# Patient Record
Sex: Female | Born: 1950 | Race: White | Hispanic: No | Marital: Married | Smoking: Never smoker
Health system: Southern US, Community
[De-identification: ages and names within clinical notes are randomized; demographics above are authoritative.]

## PROBLEM LIST (undated history)

## (undated) DIAGNOSIS — C50919 Malignant neoplasm of unspecified site of unspecified female breast: Secondary | ICD-10-CM

## (undated) DIAGNOSIS — H409 Unspecified glaucoma: Secondary | ICD-10-CM

## (undated) DIAGNOSIS — C801 Malignant (primary) neoplasm, unspecified: Secondary | ICD-10-CM

## (undated) DIAGNOSIS — M1990 Unspecified osteoarthritis, unspecified site: Secondary | ICD-10-CM

## (undated) DIAGNOSIS — T753XXA Motion sickness, initial encounter: Secondary | ICD-10-CM

## (undated) DIAGNOSIS — M8580 Other specified disorders of bone density and structure, unspecified site: Secondary | ICD-10-CM

## (undated) DIAGNOSIS — I2699 Other pulmonary embolism without acute cor pulmonale: Secondary | ICD-10-CM

## (undated) HISTORY — DX: Unspecified glaucoma: H40.9

## (undated) HISTORY — DX: Malignant (primary) neoplasm, unspecified: C80.1

## (undated) HISTORY — PX: OTHER SURGICAL HISTORY: SHX169

## (undated) HISTORY — DX: Malignant neoplasm of unspecified site of unspecified female breast: C50.919

## (undated) HISTORY — PX: EYE SURGERY: SHX253

## (undated) HISTORY — DX: Other pulmonary embolism without acute cor pulmonale: I26.99

## (undated) HISTORY — PX: TONSILLECTOMY: SUR1361

---

## 1982-12-26 HISTORY — PX: CERVICAL CONE BIOPSY: SUR198

## 2004-12-30 ENCOUNTER — Ambulatory Visit: Payer: Self-pay | Admitting: Internal Medicine

## 2005-12-26 HISTORY — PX: LEG SURGERY: SHX1003

## 2005-12-26 HISTORY — PX: HIP PINNING: SHX1757

## 2006-01-31 ENCOUNTER — Ambulatory Visit: Payer: Self-pay | Admitting: Internal Medicine

## 2006-08-23 ENCOUNTER — Inpatient Hospital Stay: Payer: Self-pay | Admitting: Unknown Physician Specialty

## 2006-08-23 ENCOUNTER — Other Ambulatory Visit: Payer: Self-pay

## 2006-08-24 ENCOUNTER — Other Ambulatory Visit: Payer: Self-pay

## 2006-09-09 ENCOUNTER — Other Ambulatory Visit: Payer: Self-pay

## 2006-09-09 ENCOUNTER — Inpatient Hospital Stay: Payer: Self-pay | Admitting: Internal Medicine

## 2006-09-18 ENCOUNTER — Ambulatory Visit: Payer: Self-pay | Admitting: Internal Medicine

## 2006-09-25 ENCOUNTER — Ambulatory Visit: Payer: Self-pay | Admitting: Internal Medicine

## 2006-09-27 ENCOUNTER — Ambulatory Visit: Payer: Self-pay | Admitting: Internal Medicine

## 2006-10-04 ENCOUNTER — Ambulatory Visit: Payer: Self-pay | Admitting: Internal Medicine

## 2006-10-11 ENCOUNTER — Ambulatory Visit: Payer: Self-pay | Admitting: Internal Medicine

## 2007-02-08 ENCOUNTER — Ambulatory Visit: Payer: Self-pay | Admitting: Internal Medicine

## 2007-02-14 ENCOUNTER — Ambulatory Visit: Payer: Self-pay | Admitting: Internal Medicine

## 2007-04-24 ENCOUNTER — Ambulatory Visit: Payer: Self-pay | Admitting: Internal Medicine

## 2009-07-30 ENCOUNTER — Ambulatory Visit: Payer: Self-pay | Admitting: Internal Medicine

## 2011-05-19 ENCOUNTER — Ambulatory Visit: Payer: Self-pay | Admitting: Internal Medicine

## 2014-04-18 ENCOUNTER — Ambulatory Visit: Payer: Self-pay | Admitting: Internal Medicine

## 2015-12-02 DIAGNOSIS — Z86711 Personal history of pulmonary embolism: Secondary | ICD-10-CM | POA: Insufficient documentation

## 2015-12-04 ENCOUNTER — Other Ambulatory Visit: Payer: Self-pay | Admitting: Internal Medicine

## 2015-12-09 ENCOUNTER — Other Ambulatory Visit: Payer: Self-pay | Admitting: Internal Medicine

## 2015-12-09 DIAGNOSIS — Z1231 Encounter for screening mammogram for malignant neoplasm of breast: Secondary | ICD-10-CM

## 2015-12-11 ENCOUNTER — Ambulatory Visit
Admission: RE | Admit: 2015-12-11 | Discharge: 2015-12-11 | Disposition: A | Discharge status: Home / Self Care | Payer: Medicare PPO | Source: Ambulatory Visit | Attending: Internal Medicine | Admitting: Internal Medicine

## 2015-12-11 ENCOUNTER — Other Ambulatory Visit: Payer: Self-pay | Admitting: Internal Medicine

## 2015-12-11 DIAGNOSIS — Z1231 Encounter for screening mammogram for malignant neoplasm of breast: Secondary | ICD-10-CM

## 2016-06-02 ENCOUNTER — Emergency Department
Admission: EM | Admit: 2016-06-02 | Discharge: 2016-06-02 | Disposition: A | Discharge status: Home / Self Care | Payer: Medicare Other | Attending: Emergency Medicine | Admitting: Emergency Medicine

## 2016-06-02 ENCOUNTER — Emergency Department: Payer: Medicare Other

## 2016-06-02 ENCOUNTER — Encounter: Payer: Self-pay | Admitting: Emergency Medicine

## 2016-06-02 DIAGNOSIS — Y9389 Activity, other specified: Secondary | ICD-10-CM | POA: Insufficient documentation

## 2016-06-02 DIAGNOSIS — S52122A Displaced fracture of head of left radius, initial encounter for closed fracture: Secondary | ICD-10-CM | POA: Insufficient documentation

## 2016-06-02 DIAGNOSIS — Y999 Unspecified external cause status: Secondary | ICD-10-CM | POA: Insufficient documentation

## 2016-06-02 DIAGNOSIS — S0990XA Unspecified injury of head, initial encounter: Secondary | ICD-10-CM | POA: Diagnosis present

## 2016-06-02 DIAGNOSIS — Y929 Unspecified place or not applicable: Secondary | ICD-10-CM | POA: Insufficient documentation

## 2016-06-02 DIAGNOSIS — S0181XA Laceration without foreign body of other part of head, initial encounter: Secondary | ICD-10-CM | POA: Insufficient documentation

## 2016-06-02 DIAGNOSIS — W19XXXA Unspecified fall, initial encounter: Secondary | ICD-10-CM

## 2016-06-02 DIAGNOSIS — W108XXA Fall (on) (from) other stairs and steps, initial encounter: Secondary | ICD-10-CM | POA: Diagnosis not present

## 2016-06-02 MED ORDER — CEPHALEXIN 500 MG PO CAPS: 500.0000 mg | ORAL_CAPSULE | Freq: Three times a day (TID) | ORAL | Status: DC

## 2016-06-02 MED ORDER — HYDROCODONE-ACETAMINOPHEN 5-325 MG PO TABS: 1.0000 | ORAL_TABLET | Freq: Four times a day (QID) | ORAL | Status: DC | PRN

## 2016-06-02 MED ORDER — OXYCODONE-ACETAMINOPHEN 5-325 MG PO TABS
1.0000 | ORAL_TABLET | Freq: Once | ORAL | Status: AC
  Administered 2016-06-02: 1 via ORAL
  Filled 2016-06-02: qty 1

## 2016-06-02 MED ORDER — LIDOCAINE-EPINEPHRINE (PF) 1 %-1:200000 IJ SOLN
10.0000 mL | Freq: Once | INTRAMUSCULAR | Status: DC
  Filled 2016-06-02: qty 30

## 2016-06-02 NOTE — Discharge Instructions (Signed)
Concussion, Adult A concussion is a brain injury. It is caused by:  A hit to the head.  A quick and sudden movement (jolt) of the head or neck. A concussion is usually not life threatening. Even so, it can cause serious problems. If you had a concussion before, you may have concussion-like problems after a hit to your head. HOME CARE General Instructions  Follow your doctor's directions carefully.  Take medicines only as told by your doctor.  Only take medicines your doctor says are safe.  Do not drink alcohol until your doctor says it is okay. Alcohol and some drugs can slow down healing. They can also put you at risk for further injury.  If you are having trouble remembering things, write them down.  Try to do one thing at a time if you get distracted easily. For example, do not watch TV while making dinner.  Talk to your family members or close friends when making important decisions.  Follow up with your doctor as told.  Watch your symptoms. Tell others to do the same. Serious problems can sometimes happen after a concussion. Older adults are more likely to have these problems.  Tell your teachers, school nurse, school counselor, coach, Product/process development scientist, or work Freight forwarder about your concussion. Tell them about what you can or cannot do. They should watch to see if:  It gets even harder for you to pay attention or concentrate.  It gets even harder for you to remember things or learn new things.  You need more time than normal to finish things.  You become annoyed (irritable) more than before.  You are not able to deal with stress as well.  You have more problems than before.  Rest. Make sure you:  Get plenty of sleep at night.  Go to sleep early.  Go to bed at the same time every day. Try to wake up at the same time.  Rest during the day.  Take naps when you feel tired.  Limit activities where you have to think a lot or concentrate. These include:  Doing  homework.  Doing work related to a job.  Watching TV.  Using the computer. Returning To Your Regular Activities Return to your normal activities slowly, not all at once. You must give your body and brain enough time to heal.   Do not play sports or do other athletic activities until your doctor says it is okay.  Ask your doctor when you can drive, ride a bicycle, or work other vehicles or machines. Never do these things if you feel dizzy.  Ask your doctor about when you can return to work or school. Preventing Another Concussion It is very important to avoid another brain injury, especially before you have healed. In rare cases, another injury can lead to permanent brain damage, brain swelling, or death. The risk of this is greatest during the first 7-10 days after your injury. Avoid injuries by:   Wearing a seat belt when riding in a car.  Not drinking too much alcohol.  Avoiding activities that could lead to a second concussion (such as contact sports).  Wearing a helmet when doing activities like:  Biking.  Skiing.  Skateboarding.  Skating.  Making your home safer by:  Removing things from the floor or stairways that could make you trip.  Using grab bars in bathrooms and handrails by stairs.  Placing non-slip mats on floors and in bathtubs.  Improve lighting in dark areas. GET HELP IF:  It gets even harder for you to pay attention or concentrate.  It gets even harder for you to remember things or learn new things.  You need more time than normal to finish things.  You become annoyed (irritable) more than before.  You are not able to deal with stress as well.  You have more problems than before.  You have problems keeping your balance.  You are not able to react quickly when you should. Get help if you have any of these problems for more than 2 weeks:   Lasting (chronic) headaches.  Dizziness or trouble balancing.  Feeling sick to your stomach  (nausea).  Seeing (vision) problems.  Being affected by noises or light more than normal.  Feeling sad, low, down in the dumps, blue, gloomy, or empty (depressed).  Mood changes (mood swings).  Feeling of fear or nervousness about what may happen (anxiety).  Feeling annoyed.  Memory problems.  Problems concentrating or paying attention.  Sleep problems.  Feeling tired all the time. GET HELP RIGHT AWAY IF:   You have bad headaches or your headaches get worse.  You have weakness (even if it is in one hand, leg, or part of the face).  You have loss of feeling (numbness).  You feel off balance.  You keep throwing up (vomiting).  You feel tired.  One black center of your eye (pupil) is larger than the other.  You twitch or shake violently (convulse).  Your speech is not clear (slurred).  You are more confused, easily angered (agitated), or annoyed than before.  You have more trouble resting than before.  You are unable to recognize people or places.  You have neck pain.  It is difficult to wake you up.  You have unusual behavior changes.  You pass out (lose consciousness). MAKE SURE YOU:   Understand these instructions.  Will watch your condition.  Will get help right away if you are not doing well or get worse.   This information is not intended to replace advice given to you by your health care provider. Make sure you discuss any questions you have with your health care provider.   Document Released: 11/30/2009 Document Revised: 01/02/2015 Document Reviewed: 07/04/2013 Elsevier Interactive Patient Education 2016 [place] or Splint Care Casts and splints support injured limbs and keep bones from moving while they heal.  HOME CARE  Keep the cast or splint uncovered during the drying period.  A plaster cast can take 24 to 48 hours to dry.  A fiberglass cast will dry in less than 1 hour.  Do not rest the cast on anything harder than a  pillow for 24 hours.  Do not put weight on your injured limb. Do not put pressure on the cast. Wait for your doctor's approval.  Keep the cast or splint dry.  Cover the cast or splint with a plastic bag during baths or wet weather.  If you have a cast over your chest and belly (trunk), take sponge baths until the cast is taken off.  If your cast gets wet, dry it with a towel or blow dryer. Use the cool setting on the blow dryer.  Keep your cast or splint clean. Wash a dirty cast with a damp cloth.  Do not put any objects under your cast or splint.  Do not scratch the skin under the cast with an object. If itching is a problem, use a blow dryer on a cool setting over the itchy  area.  Do not trim or cut your cast.  Do not take out the padding from inside your cast.  Exercise your joints near the cast as told by your doctor.  Raise (elevate) your injured limb on 1 or 2 pillows for the first 1 to 3 days. GET HELP IF:  Your cast or splint cracks.  Your cast or splint is too tight or too loose.  You itch badly under the cast.  Your cast gets wet or has a soft spot.  You have a bad smell coming from the cast.  You get an object stuck under the cast.  Your skin around the cast becomes red or sore.  You have new or more pain after the cast is put on. GET HELP RIGHT AWAY IF:  You have fluid leaking through the cast.  You cannot move your fingers or toes.  Your fingers or toes turn blue or white or are cool, painful, or puffy (swollen).  You have tingling or lose feeling (numbness) around the injured area.  You have bad pain or pressure under the cast.  You have trouble breathing or have shortness of breath.  You have chest pain.   This information is not intended to replace advice given to you by your health care provider. Make sure you discuss any questions you have with your health care provider.   Document Released: 04/13/2011 Document Revised: 08/14/2013 Document  Reviewed: 06/20/2013 Elsevier Interactive Patient Education 2016 [employer]  Cryotherapy Cryotherapy is when you put ice on your injury. Ice helps lessen pain and puffiness (swelling) after an injury. Ice works the best when you start using it in the first 24 to 48 hours after an injury. HOME CARE  Put a dry or damp towel between the ice pack and your skin.  You may press gently on the ice pack.  Leave the ice on for no more than 10 to 20 minutes at a time.  Check your skin after 5 minutes to make sure your skin is okay.  Rest at least 20 minutes between ice pack uses.  Stop using ice when your skin loses feeling (numbness).  Do not use ice on someone who cannot tell you when it hurts. This includes small children and people with memory problems (dementia). GET HELP RIGHT AWAY IF:  You have white spots on your skin.  Your skin turns blue or pale.  Your skin feels waxy or hard.  Your puffiness gets worse. MAKE SURE YOU:   Understand these instructions.  Will watch your condition.  Will get help right away if you are not doing well or get worse.   This information is not intended to replace advice given to you by your health care provider. Make sure you discuss any questions you have with your health care provider.   Document Released: 05/30/2008 Document Revised: 03/05/2012 Document Reviewed: 08/04/2011 Elsevier Interactive Patient Education 2016 [employer]  Facial Laceration A facial laceration is a cut on the face. These injuries can be painful and cause bleeding. Some cuts may need to be closed with stitches (sutures), skin adhesive strips, or wound glue. Cuts usually heal quickly but can leave a scar. It can take 1-2 years for the scar to go away completely. HOME CARE   Only take medicines as told by your doctor.  Follow your doctor's instructions for wound care. For Stitches:  Keep the cut clean and dry.  If you have a bandage (dressing), change it at  least once  a day. Change the bandage if it gets wet or dirty, or as told by your doctor.  Wash the cut with soap and water 2 times a day. Rinse the cut with water. Pat it dry with a clean towel.  Put a thin layer of medicated cream on the cut as told by your doctor.  You may shower after the first 24 hours. Do not soak the cut in water until the stitches are removed.  Have your stitches removed as told by your doctor.  Do not wear any makeup until a few days after your stitches are removed. For Skin Adhesive Strips:  Keep the cut clean and dry.  Do not get the strips wet. You may take a bath, but be careful to keep the cut dry.  If the cut gets wet, pat it dry with a clean towel.  The strips will fall off on their own. Do not remove the strips that are still stuck to the cut. For Wound Glue:  You may shower or take baths. Do not soak or scrub the cut. Do not swim. Avoid heavy sweating until the glue falls off on its own. After a shower or bath, pat the cut dry with a clean towel.  Do not put medicine or makeup on your cut until the glue falls off.  If you have a bandage, do not put tape over the glue.  Avoid lots of sunlight or tanning lamps until the glue falls off.  The glue will fall off on its own in 5-10 days. Do not pick at the glue. After Healing:  Put sunscreen on the cut for the first year to reduce your scar. GET HELP IF:  You have a fever. GET HELP RIGHT AWAY IF:   Your cut area gets red, painful, or puffy (swollen).  You see a yellowish-white fluid (pus) coming from the cut.   This information is not intended to replace advice given to you by your health care provider. Make sure you discuss any questions you have with your health care provider.   Document Released: 05/30/2008 Document Revised: 01/02/2015 Document Reviewed: 07/25/2013 Elsevier Interactive Patient Education 2016 [place] Injury, Adult You have a head injury. Headaches and  throwing up (vomiting) are common after a head injury. It should be easy to wake up from sleeping. Sometimes you must stay in the hospital. Most problems happen within the first 24 hours. Side effects may occur up to 7-10 days after the injury.  WHAT ARE THE TYPES OF HEAD INJURIES? Head injuries can be as minor as a bump. Some head injuries can be more severe. More severe head injuries include:  A jarring injury to the brain (concussion).  A bruise of the brain (contusion). This mean there is bleeding in the brain that can cause swelling.  A cracked skull (skull fracture).  Bleeding in the brain that collects, clots, and forms a bump (hematoma). WHEN SHOULD I GET HELP RIGHT AWAY?   You are confused or sleepy.  You cannot be woken up.  You feel sick to your stomach (nauseous) or keep throwing up (vomiting).  Your dizziness or unsteadiness is getting worse.  You have very bad, lasting headaches that are not helped by medicine. Take medicines only as told by your doctor.  You cannot use your arms or legs like normal.  You cannot walk.  You notice changes in the black spots in the center of the colored part of your eye (pupil).  You have  clear or bloody fluid coming from your nose or ears.  You have trouble seeing. During the next 24 hours after the injury, you must stay with someone who can watch you. This person should get help right away (call 911 in the [place] if you start to shake and are not able to control it (have seizures), you pass out, or you are unable to wake up. HOW CAN I PREVENT A HEAD INJURY IN THE FUTURE?  Wear seat belts.  Wear a helmet while bike riding and playing sports like football.  Stay away from dangerous activities around the house. WHEN CAN I RETURN TO NORMAL ACTIVITIES AND ATHLETICS? See your doctor before doing these activities. You should not do normal activities or play contact sports until 1 week after the following symptoms have  stopped:  Headache that does not go away.  Dizziness.  Poor attention.  Confusion.  Memory problems.  Sickness to your stomach or throwing up.  Tiredness.  Fussiness.  Bothered by bright lights or loud noises.  Anxiousness or depression.  Restless sleep. MAKE SURE YOU:   Understand these instructions.  Will watch your condition.  Will get help right away if you are not doing well or get worse.   This information is not intended to replace advice given to you by your health care provider. Make sure you discuss any questions you have with your health care provider.   Document Released: 11/24/2008 Document Revised: 01/02/2015 Document Reviewed: 08/19/2013 Elsevier Interactive Patient Education 2016 [employer] Inc[employer]    Radial Fracture A radial fracture is a break in the radius bone, which is the long bone of the forearm that is on the same side as your thumb. Your forearm is the part of your arm that is between your elbow and your wrist. It is made up of two bones: the radius and the ulna. Most radial fractures occur near the wrist (distal radialfracture) or near the elbow (radial head fracture). A distal radial fracture is the most common type of broken arm. This fracture usually occurs about an inch above the wrist. Fractures of the middle part of the bone are less common. CAUSES  Falling with your arm outstretched is the most common cause of a radial fracture. Other causes include:  Car accidents.  Bike accidents.  A direct blow to the middle part of the radius. RISK FACTORS  You may be at greater risk for a distal radial fracture if you are 44 years of age or older.  You may be at greater risk for a radial head fracture if you are:  Female.  60-35 years old.  You may be at a greater risk for all types of radial fractures if you have a condition that causes your bones to be weak or thin (osteoporosis). SIGNS AND SYMPTOMS A radial fracture causes pain  immediately after the injury. Other signs and symptoms include:  An abnormal bend or bump in your arm (deformity).  Swelling.  Bruising.  Numbness or tingling.  Tenderness.  Limited movement. DIAGNOSIS  Your health care provider may diagnose a radial fracture based on:  Your symptoms.  Your medical history, including any recent injury.  A physical exam. Your health care provider will look for any deformity and feel for tenderness over the break. Your health care provider will also check whether the bone is out of place.  An X-ray exam to confirm the diagnosis and learn more about the type of fracture. TREATMENT The goals of treatment are to  get the bone in proper position for healing and to keep it from moving so it will heal over time. Your treatment will depend on many factors, especially the type of fracture that you have.  If the fractured bone:  Is in the correct position (nondisplaced), you may only need to wear a cast or a splint.  Has a slightly displaced fracture, you may need to have the bones moved back into place manually (closed reduction) before the splint or cast is put on.  You may have a temporary splint before you have a plaster cast. The splint allows room for some swelling. After a few days, a cast can replace the splint.  You may have to wear the cast for about 6 weeks or as directed by your health care provider.  The cast may be changed after about 3 weeks or as directed by your health care provider.  After your cast is taken off, you may need physical therapy to regain full movement in your wrist or elbow.  You may need emergency surgery if you have:  A fractured bone that is out of position (displaced).  A fracture with multiple fragments (comminuted fracture).  A fracture that breaks the skin (open fracture). This type of fracture may require surgical wires, plates, or screws to hold the bone in place.  You may have X-rays every couple of weeks  to check on your healing. HOME CARE INSTRUCTIONS  Keep the injured arm above the level of your heart while you are sitting or lying down. This helps to reduce swelling and pain.  Apply ice to the injured area:  Put ice in a plastic bag.  Place a towel between your skin and the bag.  Leave the ice on for 20 minutes, 2-3 times per day.  Move your fingers often to avoid stiffness and to minimize swelling.  If you have a plaster or fiberglass cast:  Do not try to scratch the skin under the cast using sharp or pointed objects.  Check the skin around the cast every day. You may put lotion on any red or sore areas.  Keep your cast dry and clean.  If you have a plaster splint:  Wear the splint as directed.  Loosen the elastic around the splint if your fingers become numb and tingle, or if they turn cold and blue.  Do not put pressure on any part of your cast until it is fully hardened. Rest your cast only on a pillow for the first 24 hours.  Protect your cast or splint while bathing or showering, as directed by your health care provider. Do not put your cast or splint into water.  Take medicines only as directed by your health care provider.  Return to activities, such as sports, as directed by your health care provider. Ask your health care provider what activities are safe for you.  Keep all follow-up visits as directed by your health care provider. This is important. SEEK MEDICAL CARE IF:  Your pain medicine is not helping.  Your cast gets damaged or it breaks.  Your cast becomes loose.  Your cast gets wet.  You have more severe pain or swelling than you did before the cast.  You have severe pain when stretching your fingers.  You continue to have pain or stiffness in your elbow or your wrist after your cast is taken off. SEEK IMMEDIATE MEDICAL CARE IF:  You cannot move your fingers.  You lose feeling in your fingers or  your hand.  Your hand or your fingers turn  cold and pale or blue.  You notice a bad smell coming from your cast.  You have drainage from underneath your cast.  You have new stains from blood or drainage seeping through your cast.   This information is not intended to replace advice given to you by your health care provider. Make sure you discuss any questions you have with your health care provider.   Document Released: 05/25/2006 Document Revised: 01/02/2015 Document Reviewed: 06/06/2014 Elsevier Interactive Patient Education 2016 [place] Sutures are stitches that can be used to close wounds. Taking care of your wound properly can help to prevent pain and infection. It can also help your wound to heal more quickly. HOW TO CARE FOR YOUR SUTURED WOUND Wound Care  Keep the wound clean and dry.  If you were given a bandage (dressing), you should change it at least once per day or as directed by your health care provider. You should also change it if it becomes wet or dirty.  Keep the wound completely dry for the first 24 hours or as directed by your health care provider. After that time, you may shower or bathe. However, make sure that the wound is not soaked in water until the sutures have been removed.  Clean the wound one time each day or as directed by your health care provider.  Wash the wound with soap and water.  Rinse the wound with water to remove all soap.  Pat the wound dry with a clean towel. Do not rub the wound.  Aftercleaning the wound, apply a thin layer of antibioticointment as directed by your health care provider. This will help to prevent infection and keep the dressing from sticking to the wound.  Have the sutures removed as directed by your health care provider. General Instructions  Take or apply medicines only as directed by your health care provider.  To help prevent scarring, make sure to cover your wound with sunscreen whenever you are outside after the sutures are  removed and the wound is healed. Make sure to wear a sunscreen of at least 30 SPF.  If you were prescribed an antibiotic medicine or ointment, finish all of it even if you start to feel better.  Do not scratch or pick at the wound.  Keep all follow-up visits as directed by your health care provider. This is important.  Check your wound every day for signs of infection. Watch for:   Redness, swelling, or pain.  Fluid, blood, or pus.  Raise (elevate) the injured area above the level of your heart while you are sitting or lying down, if possible.  Avoid stretching your wound.  Drink enough fluids to keep your urine clear or pale yellow. SEEK MEDICAL CARE IF:  You received a tetanus shot and you have swelling, severe pain, redness, or bleeding at the injection site.  You have a fever.  A wound that was closed breaks open.  You notice a bad smell coming from the wound.  You notice something coming out of the wound, such as wood or glass.  Your pain is not controlled with medicine.  You have increased redness, swelling, or pain at the site of your wound.  You have fluid, blood, or pus coming from your wound.  You notice a change in the color of your skin near your wound.  You need to change the dressing frequently due to fluid, blood, or  pus draining from the wound.  You develop a new rash.  You develop numbness around the wound. SEEK IMMEDIATE MEDICAL CARE IF:  You develop severe swelling around the injury site.  Your pain suddenly increases and is severe.  You develop painful lumps near the wound or on skin that is anywhere on your body.  You have a red streak going away from your wound.  The wound is on your hand or foot and you cannot properly move a finger or toe.  The wound is on your hand or foot and you notice that your fingers or toes look pale or bluish.   This information is not intended to replace advice given to you by your health care provider. Make  sure you discuss any questions you have with your health care provider.   Document Released: 01/19/2005 Document Revised: 04/28/2015 Document Reviewed: 07/24/2013 Elsevier Interactive Patient Education [employer].

## 2016-06-02 NOTE — ED Notes (Addendum)
Pt reports tripping walking into garage and hitting head on concrete floor, denies LOC. Pt reports taking aspirin daily. Pt with laceration to left side of forehead and reports pain to left lower arm.

## 2016-06-02 NOTE — ED Notes (Signed)
Patient presents to the ED after falling and hitting her head.  Patient states she was going down her garage steps barefoot and tripped.  Patient denies feeling dizzy.  Denies losing consciousness.  Patient is alert and oriented x 3.  Patient is in no obvious distress at this time.

## 2016-06-02 NOTE — ED Provider Notes (Signed)
[facility] Emergency Department Provider Note  ____________________________________________  Time seen: Approximately 7:39 PM  I have reviewed the triage vital signs and the nursing notes.   HISTORY  Chief Complaint Head Injury    HPI Stacy Warren is a 65 y.o. female, NAD, presents to the emergency department after a fall down 2 stairs onto her concrete garage floor. She notes that she attempted to brace herself with her left arm and believes that she fell onto her left side. She complains of 5/10 throbbing pain to the left elbow and a head laceration superior to the left eyebrow. Patient denies LOC, dizziness, new medications, nor feelings of unsteadiness prior to fall but does feel that she was tired today from excessive activities. She denies headace, difficulty concentrating, dizzines, vision changes, drowsiness, nausea, vomiting, nor impaired memory. Reports she was able to walk from the car into the emergency room without issue or assistance. Denies chest pain, back pain, neck pain, saddle paresthesias, numbness, weakness, tingling.   History reviewed. No pertinent past medical history.  There are no active problems to display for this patient.   History reviewed. No pertinent past surgical history.  Current Outpatient Rx  Name  Route  Sig  Dispense  Refill  . cephALEXin (KEFLEX) 500 MG capsule   Oral   Take 1 capsule (500 mg total) by mouth 3 (three) times daily.   21 capsule   0   . HYDROcodone-acetaminophen (NORCO) 5-325 MG tablet   Oral   Take 1 tablet by mouth every 6 (six) hours as needed for severe pain.   20 tablet   0     Allergies Review of patient's allergies indicates no known allergies.  Family History  Problem Relation Age of Onset  . Breast cancer Maternal Aunt 35  . Breast cancer Maternal Aunt 83    Social History Social History  Substance Use Topics  . Smoking status: Never Smoker   . Smokeless tobacco: None  .  Alcohol Use: No     Review of Systems  Constitutional: No fever/chills.  Eyes: No visual changes. No discharge, Redness, swelling, pain.  ENT: No dental pain or loose teeth Cardiovascular: No chest pain, palpitations Respiratory: No cough. No shortness of breath. No wheezing.  Gastrointestinal: No abdominal pain.  No nausea, vomiting.   Musculoskeletal: Positive left elbow pain. Negative for back, neck pain.  Skin: Positive laceration above left eye. Negative for rash, bruising, swelling, redness.  Neurological: Negative for headaches, focal weakness or numbness. No tingling. No LOC, dizziness, loss of bowel or bladder control, saddle paresthesias. No changes in speech or gait. 10-point ROS otherwise negative.  ____________________________________________   PHYSICAL EXAM:  VITAL SIGNS: ED Triage Vitals  Enc Vitals Group     BP 06/02/16 1731 140/82 mmHg     Pulse Rate 06/02/16 1731 78     Resp 06/02/16 1731 18     Temp 06/02/16 1731 97.9 F (36.6 C)     Temp Source 06/02/16 1731 Oral     SpO2 06/02/16 1731 96 %     Weight 06/02/16 1731 200 lb (90.719 kg)     Height 06/02/16 1731 5\' 5"  (1.651 m)     Head Cir --      Peak Flow --      Pain Score 06/02/16 1731 5     Pain Loc --      Pain Edu? --      Excl. in [place]? --  Constitutional: Alert and oriented. Well appearing and in no acute distress. Eyes: Conjunctivae are normal. PERRLA. EOMI without pain.  Head: Normocephalic. Neck: No cervical spine tenderness to palpation. Supple with full range of motion. Hematological/Lymphatic/Immunilogical: No cervical lymphadenopathy. Cardiovascular: Normal rate, regular rhythm. Grossly normal heart sounds. Good peripheral circulation with 2+ pulses noted in bilateral upper and lower extremities. Respiratory: Normal respiratory effort without tachypnea or retractions. Lungs CTAB with breath sounds noted in all lung fields. Musculoskeletal: Tenderness to palpation about the proximal  forearm. Patient notes pain with pronation and supination. Patient unable to fully extend the left elbow due to pain. Full range of motion of bilateral wrists, hands, fingers. Grip strength is normal in bilateral hands. No lower extremity tenderness nor edema.  No joint effusions. Neurologic:  Normal speech and language. No gross focal neurologic deficits are appreciated. CN III-XII grossly in tact.  Skin:  3 cm laceration noted to the left forehead above the eyebrow. Bleeding is controlled. Skin is warm, dry. No rash noted. Psychiatric: Mood and affect are normal. Speech and behavior are normal. Patient exhibits appropriate insight and judgement.   ____________________________________________   LABS  None ____________________________________________  EKG  None ____________________________________________  RADIOLOGY I have personally viewed and evaluated these images (plain radiographs) as part of my medical decision making, as well as reviewing the written report by the radiologist.  Dg Elbow Complete Left  06/02/2016  CLINICAL DATA:  Fall down stairs EXAM: LEFT ELBOW - COMPLETE 3+ VIEW COMPARISON:  None. FINDINGS: Mildly comminuted radial head/ neck fracture. Associated elbow joint effusion. IMPRESSION: Mildly comminuted radial head/ neck fracture. Electronically Signed   By: Julian Hy M.D.   On: 06/02/2016 18:33   Dg Wrist Complete Left  06/02/2016  CLINICAL DATA:  Status post fall.  Left wrist pain. EXAM: LEFT WRIST - COMPLETE 3+ VIEW COMPARISON:  None. FINDINGS: There is no evidence of fracture or dislocation. Severe osteoarthritis of the first [facility] joint. Mild osteoarthritis of the scaphotrapeziotrapezoid joint. Widening of the scapholunate distance as can be seen with a scapholunate ligament tear. Soft tissues are unremarkable. IMPRESSION: 1. No acute osseous injury of the left wrist. 2. Severe osteoarthritis of the first [facility] joint. 3. Widening of the scapholunate distance as can  be seen with a scapholunate ligament tear. Electronically Signed   By: Kathreen Devoid   On: 06/02/2016 18:18   Ct Head Wo Contrast  06/02/2016  CLINICAL DATA:  Fall, laceration to left-sided forehead, pain to left lower arm. Patient takes a daily aspirin. EXAM: CT HEAD WITHOUT CONTRAST CT CERVICAL SPINE WITHOUT CONTRAST TECHNIQUE: Multidetector CT imaging of the head and cervical spine was performed following the standard protocol without intravenous contrast. Multiplanar CT image reconstructions of the cervical spine were also generated. COMPARISON:  None. FINDINGS: CT HEAD FINDINGS Ventricles are normal in size and configuration. All areas of the brain demonstrate normal gray-white matter attenuation. There is no mass, hemorrhage, edema or other evidence of acute parenchymal abnormality. No extra-axial hemorrhage. Soft tissue edema/laceration noted over the lower left frontal bone, with overlying bandages. No underlying fracture. No foreign body appreciated within the soft tissues. CT CERVICAL SPINE FINDINGS Degenerative change within the mid and lower cervical spine, moderate in degree, with associated disc space narrowing, osseous spurring and vacuum disc phenomenon. Reversal of the normal cervical lordosis is likely related to these underlying degenerative changes. Associated mild disc-osteophytic bulges are seen at multiple levels, but there is no more than mild central canal stenosis appreciated at any  level. Additional degenerative hypertrophy of the uncovertebral joints at the C5-6 and C6-7 levels are causing moderate neural foramen narrowings with possible associated nerve root impingement. No fracture line or displaced fracture fragment identified. Facet joints are normally aligned throughout. Paravertebral soft tissues are unremarkable. IMPRESSION: 1. Soft tissue edema/laceration overlying the left lower frontal bone. No underlying fracture. 2. No acute intracranial abnormality. No intracranial  hemorrhage or edema. 3. No fracture or acute subluxation identified within the cervical spine. Degenerative changes within the cervical spine, as detailed above. Electronically Signed   By: Franki Cabot M.D.   On: 06/02/2016 18:22   Ct Cervical Spine Wo Contrast  06/02/2016  CLINICAL DATA:  Fall, laceration to left-sided forehead, pain to left lower arm. Patient takes a daily aspirin. EXAM: CT HEAD WITHOUT CONTRAST CT CERVICAL SPINE WITHOUT CONTRAST TECHNIQUE: Multidetector CT imaging of the head and cervical spine was performed following the standard protocol without intravenous contrast. Multiplanar CT image reconstructions of the cervical spine were also generated. COMPARISON:  None. FINDINGS: CT HEAD FINDINGS Ventricles are normal in size and configuration. All areas of the brain demonstrate normal gray-white matter attenuation. There is no mass, hemorrhage, edema or other evidence of acute parenchymal abnormality. No extra-axial hemorrhage. Soft tissue edema/laceration noted over the lower left frontal bone, with overlying bandages. No underlying fracture. No foreign body appreciated within the soft tissues. CT CERVICAL SPINE FINDINGS Degenerative change within the mid and lower cervical spine, moderate in degree, with associated disc space narrowing, osseous spurring and vacuum disc phenomenon. Reversal of the normal cervical lordosis is likely related to these underlying degenerative changes. Associated mild disc-osteophytic bulges are seen at multiple levels, but there is no more than mild central canal stenosis appreciated at any level. Additional degenerative hypertrophy of the uncovertebral joints at the C5-6 and C6-7 levels are causing moderate neural foramen narrowings with possible associated nerve root impingement. No fracture line or displaced fracture fragment identified. Facet joints are normally aligned throughout. Paravertebral soft tissues are unremarkable. IMPRESSION: 1. Soft tissue  edema/laceration overlying the left lower frontal bone. No underlying fracture. 2. No acute intracranial abnormality. No intracranial hemorrhage or edema. 3. No fracture or acute subluxation identified within the cervical spine. Degenerative changes within the cervical spine, as detailed above. Electronically Signed   By: Franki Cabot M.D.   On: 06/02/2016 18:22    ____________________________________________    PROCEDURES  Procedure(s) performed: LACERATION REPAIR Performed by: Braxton Feathers Authorized by: Braxton Feathers Consent: Verbal consent obtained. Risks and benefits: risks, benefits and alternatives were discussed Consent given by: patient Patient identity confirmed: provided demographic data Prepped and Draped in normal sterile fashion Wound explored  Laceration Location: Left forehead above the eyebrow  Laceration Length: 3cm  No Foreign Bodies seen or palpated  Anesthesia: local infiltration  Local anesthetic: lidocaine 1% with epinephrine  Anesthetic total: 2 ml  Irrigation method: syringe Amount of cleaning: Copious with 532mL of normal saline  Skin closure: 4-0 ethilon  Number of sutures: 6  Technique: simple, interrupted  Patient tolerance: Patient tolerated the procedure well with no immediate complications.    Medications  lidocaine-EPINEPHrine (XYLOCAINE-EPINEPHrine) 1 %-1:200000 (PF) injection 10 mL (not administered)  oxyCODONE-acetaminophen (PERCOCET/ROXICET) 5-325 MG per tablet 1 tablet (1 tablet Oral Given 06/02/16 1945)     ____________________________________________   INITIAL IMPRESSION / ASSESSMENT AND PLAN / ED COURSE  Pertinent labs & imaging results that were available during my care of the patient were reviewed by me and considered in  my medical decision making (see chart for details).  Patient's diagnosis is consistent with fracture of left radial head, laceration of forehead without complication, head injury due to fall. Patient  will be discharged home with prescriptions for Keflex and Norco to take as directed. Patient was placed in a sugar tong splint about the left arm as well as given a arm sling for comfort care. Patient is to follow up with her orthopedist, Sherren Mocha Monday, in 3 days for recheck of radial fracture. Patient advised to follow-up with her primary care provider on Monday for recheck of her current injuries as well as to have a wound recheck. Patient and her husband at the bedside were given strict ED precautions to return to the ED for any worsening or new symptoms.    ____________________________________________  FINAL CLINICAL IMPRESSION(S) / ED DIAGNOSES  Final diagnoses:  Fracture of radial head, closed, left, initial encounter  Laceration of forehead without complication, initial encounter  Head injury, initial encounter  Fall, initial encounter      NEW MEDICATIONS STARTED DURING THIS VISIT:  Discharge Medication List as of 06/02/2016 10:13 PM    START taking these medications   Details  cephALEXin (KEFLEX) 500 MG capsule Take 1 capsule (500 mg total) by mouth 3 (three) times daily., Starting 06/02/2016, Until Discontinued, Print    HYDROcodone-acetaminophen (NORCO) 5-325 MG tablet Take 1 tablet by mouth every 6 (six) hours as needed for severe pain., Starting 06/02/2016, Until Discontinued, Print             Braxton Feathers, PA-C 06/04/16 0048  Orbie Pyo, MD 06/07/16 [phone]

## 2016-06-02 NOTE — ED Notes (Signed)
Pt. Verbalizes understanding of d/c instructions, prescriptions, and follow-up. VS stable.  Pt. In NAD at time of d/c and denies concerns. Pt. Out of the unit by wheelchair with husband.

## 2016-06-02 NOTE — ED Notes (Signed)
Patient transported to X-ray 

## 2017-02-08 ENCOUNTER — Other Ambulatory Visit: Payer: Self-pay | Admitting: Internal Medicine

## 2017-02-08 DIAGNOSIS — Z1231 Encounter for screening mammogram for malignant neoplasm of breast: Secondary | ICD-10-CM

## 2017-02-22 ENCOUNTER — Encounter: Payer: Self-pay | Admitting: Obstetrics and Gynecology

## 2017-02-22 ENCOUNTER — Ambulatory Visit (INDEPENDENT_AMBULATORY_CARE_PROVIDER_SITE_OTHER): Payer: Medicare Other | Admitting: Obstetrics and Gynecology

## 2017-02-22 VITALS — BP 130/80 | HR 65 | Ht 65.0 in | Wt 214.0 lb

## 2017-02-22 DIAGNOSIS — Z1239 Encounter for other screening for malignant neoplasm of breast: Secondary | ICD-10-CM

## 2017-02-22 DIAGNOSIS — Z803 Family history of malignant neoplasm of breast: Secondary | ICD-10-CM | POA: Diagnosis not present

## 2017-02-22 DIAGNOSIS — Z01419 Encounter for gynecological examination (general) (routine) without abnormal findings: Secondary | ICD-10-CM | POA: Diagnosis not present

## 2017-02-22 DIAGNOSIS — Z1231 Encounter for screening mammogram for malignant neoplasm of breast: Secondary | ICD-10-CM | POA: Diagnosis not present

## 2017-02-22 DIAGNOSIS — Z124 Encounter for screening for malignant neoplasm of cervix: Secondary | ICD-10-CM

## 2017-02-22 NOTE — Progress Notes (Signed)
HPI:      [name] is a 66 y.o. [phone] who LMP was No LMP recorded. Patient is postmenopausal., presents today for her NP annual examination.    She denies vasomotor sx.   She is not sexually active. She denies vaginal dryness.  Last Pap: yrs ago Results were: no abnormalities    Last mammogram: 2015.  Results were: normal--routine follow-up in 12 months. Pt has mammo sched 3/18. There is a FH of breast cancer in 2 maternal aunts. Genetic testing hasnt' been done. Pt interested. No FH ovarian cancer. The patient does do self-breast exams.  Tobacco use: The patient denies current or previous tobacco use. Alcohol use: none Exercise: moderately active  She does get adequate calcium and Vitamin D in her diet.  Colonoscopy is current.  No past medical history on file.  Past Surgical History:  Procedure Laterality Date  . HIP PINNING  2007  . LEG SURGERY  2007   plate    Family History  Problem Relation Age of Onset  . Breast cancer Maternal Aunt 35  . Breast cancer Maternal Aunt 35     ROS:  Review of Systems  Constitutional: Negative for fever, malaise/fatigue and weight loss.  HENT: Negative for congestion, ear pain and sinus pain.   Respiratory: Negative for cough, shortness of breath and wheezing.   Cardiovascular: Negative for chest pain, orthopnea and leg swelling.  Gastrointestinal: Negative for constipation, diarrhea, nausea and vomiting.  Genitourinary: Negative for dysuria, frequency, hematuria and urgency.  Musculoskeletal: Negative for back pain, joint pain and myalgias.  Skin: Negative for itching and rash.  Neurological: Negative for dizziness, tingling, focal weakness and headaches.  Endo/Heme/Allergies: Negative for environmental allergies. Does not bruise/bleed easily.  Psychiatric/Behavioral: Negative for depression and suicidal ideas. The patient is not nervous/anxious and does not have insomnia.     Objective: BP 130/80 (BP Location:  Right Arm, Patient Position: Sitting)   Pulse 65   Ht 5\' 5"  (1.651 m)   Wt 214 lb (97.1 kg)   BMI 35.61 kg/m    Physical Exam  Constitutional: She is oriented to person, place, and time. She appears well-developed and well-nourished.  Genitourinary: Vagina normal and uterus normal. No erythema or tenderness in the vagina. No vaginal discharge found. Right adnexum does not display mass and does not display tenderness. Left adnexum does not display mass and does not display tenderness. Cervix does not exhibit motion tenderness or polyp. Uterus is not enlarged or tender.  Neck: Normal range of motion. No thyromegaly present.  Cardiovascular: Normal rate, regular rhythm and normal heart sounds.   No murmur heard. Pulmonary/Chest: Effort normal and breath sounds normal. Right breast exhibits no mass, no nipple discharge, no skin change and no tenderness. Left breast exhibits no mass, no nipple discharge, no skin change and no tenderness.  Abdominal: Soft. There is no tenderness. There is no guarding.  Musculoskeletal: Normal range of motion.  Neurological: She is alert and oriented to person, place, and time. No cranial nerve deficit.  Psychiatric: She has a normal mood and affect. Her behavior is normal.  Vitals reviewed.   Results: No results found for this or any previous visit (from the past 24 hour(s)).  Assessment: 1. Encounter for annual routine gynecological examination  - Pap IG and HPV (high risk) DNA detection  2. Cervical cancer screening  - Pap IG and HPV (high risk) DNA detection  3. Screening for breast cancer Pt has mammo sched.  4. Family  history of breast cancer My Risk testing discussed due to [place] of cancer.  Pt accepts testing and will RTO in 6 weeks for results/mgmt.   Plan:            GYN counsel breast self exam, mammography screening, adequate intake of calcium and vitamin D     F/U  Return in about 6 weeks (around 04/05/2017) for My Risk resutls  f/u.  Stacy Dieu B. Laruth Hanger, PA-C 02/22/2017 11:37 AM

## 2017-02-24 LAB — PAP IG AND HPV HIGH-RISK
HPV, high-risk: NEGATIVE
PAP Smear Comment: 0

## 2017-02-28 ENCOUNTER — Telehealth: Payer: Self-pay

## 2017-02-28 ENCOUNTER — Telehealth: Payer: Self-pay | Admitting: Obstetrics and Gynecology

## 2017-02-28 NOTE — Telephone Encounter (Signed)
LM for pt that My Risk BRAC analysis was not covered by Medicare and test was cancelled.  I didn't realize pt had Medicare when test was done. Nothing further to do at this time. Will cancel 6 wk f/u appt. RTO in 2 yrs for annual.

## 2017-02-28 NOTE — Telephone Encounter (Signed)
Pt called.  Had a pap done last week.  Had a missed call today.  Please return call [phone].

## 2017-03-01 ENCOUNTER — Telehealth: Payer: Self-pay | Admitting: Obstetrics and Gynecology

## 2017-03-01 NOTE — Telephone Encounter (Signed)
Cancelled.  

## 2017-03-01 NOTE — Telephone Encounter (Signed)
-----   Message from Chad Cordial, PA-C sent at 02/28/2017 11:27 AM EST ----- Regarding: appt Pls cancel pt's 6 wk f/u My Risk results appt. LM for pt. Testing not going to be done. Thx.

## 2017-03-07 ENCOUNTER — Ambulatory Visit
Admission: RE | Admit: 2017-03-07 | Discharge: 2017-03-07 | Disposition: A | Discharge status: Home / Self Care | Payer: Medicare Other | Source: Ambulatory Visit | Attending: Internal Medicine | Admitting: Internal Medicine

## 2017-03-07 DIAGNOSIS — Z1231 Encounter for screening mammogram for malignant neoplasm of breast: Secondary | ICD-10-CM | POA: Diagnosis not present

## 2017-04-05 ENCOUNTER — Ambulatory Visit: Payer: Medicare Other | Admitting: Obstetrics and Gynecology

## 2018-04-09 ENCOUNTER — Telehealth: Payer: Self-pay | Admitting: Obstetrics & Gynecology

## 2018-04-09 ENCOUNTER — Encounter: Payer: Self-pay | Admitting: Family Medicine

## 2018-04-09 ENCOUNTER — Ambulatory Visit: Payer: Medicare Other | Admitting: Family Medicine

## 2018-04-09 VITALS — BP 124/84 | HR 78 | Temp 98.1°F | Resp 14 | Ht 62.63 in | Wt 207.7 lb

## 2018-04-09 DIAGNOSIS — Z1239 Encounter for other screening for malignant neoplasm of breast: Secondary | ICD-10-CM

## 2018-04-09 DIAGNOSIS — Z1231 Encounter for screening mammogram for malignant neoplasm of breast: Secondary | ICD-10-CM

## 2018-04-09 DIAGNOSIS — N393 Stress incontinence (female) (male): Secondary | ICD-10-CM | POA: Diagnosis not present

## 2018-04-09 DIAGNOSIS — I2699 Other pulmonary embolism without acute cor pulmonale: Secondary | ICD-10-CM

## 2018-04-09 DIAGNOSIS — N898 Other specified noninflammatory disorders of vagina: Secondary | ICD-10-CM | POA: Diagnosis not present

## 2018-04-09 DIAGNOSIS — E669 Obesity, unspecified: Secondary | ICD-10-CM

## 2018-04-09 DIAGNOSIS — R1030 Lower abdominal pain, unspecified: Secondary | ICD-10-CM

## 2018-04-09 DIAGNOSIS — Z13820 Encounter for screening for osteoporosis: Secondary | ICD-10-CM

## 2018-04-09 DIAGNOSIS — R82998 Other abnormal findings in urine: Secondary | ICD-10-CM | POA: Diagnosis not present

## 2018-04-09 DIAGNOSIS — N8110 Cystocele, unspecified: Secondary | ICD-10-CM

## 2018-04-09 DIAGNOSIS — N182 Chronic kidney disease, stage 2 (mild): Secondary | ICD-10-CM

## 2018-04-09 HISTORY — DX: Other pulmonary embolism without acute cor pulmonale: I26.99

## 2018-04-09 LAB — POCT URINALYSIS DIPSTICK
Appearance: NORMAL
Bilirubin, UA: NEGATIVE
Glucose, UA: NEGATIVE
Ketones, UA: NEGATIVE
Nitrite, UA: NEGATIVE
Protein, UA: NEGATIVE
Spec Grav, UA: 1.01 (ref 1.010–1.025)
Urobilinogen, UA: 0.2 E.U./dL
pH, UA: 7 (ref 5.0–8.0)

## 2018-04-09 NOTE — Patient Instructions (Addendum)
Check out the information at familydoctor.org entitled "Nutrition for Weight Loss: What You Need to Know about Fad Diets" Try to lose between 1-2 pounds per week by taking in fewer calories and burning off more calories You can succeed by limiting portions, limiting foods dense in calories and fat, becoming more active, and drinking 8 glasses of water a day (64 ounces) Don't skip meals, especially breakfast, as skipping meals may alter your metabolism Do not use over-the-counter weight loss pills or gimmicks that claim rapid weight loss A healthy BMI (or body mass index) is between 18.5 and 24.9 You can calculate your ideal BMI at the [place] website [employer]Mon[employer].fr  Kegel Exercises Kegel exercises help strengthen the muscles that support the rectum, vagina, small intestine, bladder, and uterus. Doing Kegel exercises can help:  Improve bladder and bowel control.  Improve sexual response.  Reduce problems and discomfort during pregnancy.  Kegel exercises involve squeezing your pelvic floor muscles, which are the same muscles you squeeze when you try to stop the flow of urine. The exercises can be done while sitting, standing, or lying down, but it is best to vary your position. Phase 1 exercises 1. Squeeze your pelvic floor muscles tight. You should feel a tight lift in your rectal area. If you are a female, you should also feel a tightness in your vaginal area. Keep your stomach, buttocks, and legs relaxed. 2. Hold the muscles tight for up to 10 seconds. 3. Relax your muscles. Repeat this exercise 50 times a day or as many times as told by your health care provider. Continue to do this exercise for at least 4-6 weeks or for as long as told by your health care provider. This information is not intended to replace advice given to you by your health care provider. Make sure you discuss any questions you have with your health care provider. Document  Released: 11/28/2012 Document Revised: 08/06/2016 Document Reviewed: 11/01/2015 Elsevier Interactive Patient Education  [employer].

## 2018-04-09 NOTE — Assessment & Plan Note (Signed)
Avoid NSAIDs; stay hydrated; explained diagnosis, nothing to worry about at this time

## 2018-04-09 NOTE — Assessment & Plan Note (Signed)
Encouraged modest weight loss; see AVS

## 2018-04-09 NOTE — Telephone Encounter (Signed)
[facility] referring for Bladder prolapse, female, acquired. Called and left voicemail for patient to call back.Marland KitchenMarland Kitchen

## 2018-04-09 NOTE — Assessment & Plan Note (Signed)
Hx of PE in 2007; not limited in any way from breathing standpoint

## 2018-04-09 NOTE — Progress Notes (Signed)
BP 124/84   Pulse 78   Temp 98.1 F (36.7 C) (Oral)   Resp 14   Ht 5' 2.63" (1.591 m)   Wt 207 lb 11.2 oz (94.2 kg)   SpO2 93%   BMI 37.23 kg/m    Subjective:    Patient ID: Stacy Warren, female    DOB: 1951-10-03, 67 y.o.   MRN: [id number]  HPI: Stacy Warren is a 67 y.o. female  Chief Complaint  Patient presents with  . New Patient (Initial Visit)    HPI Patient is here to establish care Taking aspirin just for prevention She fell in 2007 and broke her femur; she had a plate and pin in her hip (right); hit a rock and wasn't looking and her foot went over; discovered then that her "blood was thick"; I asked if she had a clot; she was home for a week from the hospital, then one morning not feeling real well; the day before, she had passed out, went to her primary, Dr. Gilford Rile, he could not find anything; the next morning, she called ortho PA Reche Dixon, and he sent her to the ER; she had a blood clot in the lungs Last labs were maybe  Dr. Gilford Rile had been her primary for 15 years; he retired last fall Stage 2 CKD; noted on last labs from Dec 2017; husband is diabetic, and has unsweetened tea at home, just sweet when dining out Family hx of thyroid disease; I asked about weight gain or constipation Obesity; she is swimming 2-3 x a week, going to the pool; eats in the late afternoon and not at night; feels better; lost 7 pounds over the last year First baby was 9 pounds 1 ounce, second baby 10 pounds 3 ounces; husband's family is big; no gestational diabetes Bladder issues, has fallen; has some leakage if she hasn't voided recently Some brownish discharge; pap smears are UTD; reviewed pap smear 01/2017; discharge is vaginal, not from the rectum; this morning, had some pains in her pelvic area; thought if she had a BM, it would take care of it and it did; ate [place] yesterday and rings were tight this morning  Depression screen [facility] 2/9 04/09/2018  Decreased Interest 0  Down,  Depressed, Hopeless 0  PHQ - 2 Score 0   Relevant past medical, surgical, family and social history reviewed  Past Medical History:  Diagnosis Date  . Pulmonary embolism ([place]) 04/09/2018   Completed course of blood thinner in 2007; after femur fracture and surgery   Past Surgical History:  Procedure Laterality Date  . HIP PINNING  2007  . LEG SURGERY  2007   plate   Family History  Problem Relation Age of Onset  . Breast cancer Maternal Aunt 35  . Breast cancer Maternal Aunt 35  . Alzheimer's disease Mother   . Lymphoma Father   . Heart disease Father   . Thyroid disease Son    Social History   Tobacco Use  . Smoking status: Never Smoker  . Smokeless tobacco: Never Used  Substance Use Topics  . Alcohol use: No  . Drug use: No    Interim medical history since last visit reviewed. Allergies and medications reviewed  Review of Systems Per HPI unless specifically indicated above     Objective:    BP 124/84   Pulse 78   Temp 98.1 F (36.7 C) (Oral)   Resp 14   Ht 5' 2.63" (1.591 m)   Wt 207  lb 11.2 oz (94.2 kg)   SpO2 93%   BMI 37.23 kg/m   Wt Readings from Last 3 Encounters:  04/09/18 207 lb 11.2 oz (94.2 kg)  02/22/17 214 lb (97.1 kg)  06/02/16 200 lb (90.7 kg)    Physical Exam  Constitutional: She appears well-developed and well-nourished. No distress.  HENT:  Head: Normocephalic and atraumatic.  Eyes: EOM are normal. No scleral icterus.  Neck: No thyromegaly present.  Cardiovascular: Normal rate, regular rhythm and normal heart sounds.  No murmur heard. Pulmonary/Chest: Effort normal and breath sounds normal. No respiratory distress. She has no wheezes.  Abdominal: Soft. Bowel sounds are normal. She exhibits no distension.  Genitourinary: Right adnexum displays no mass, no tenderness and no fullness. Left adnexum displays no mass, no tenderness and no fullness. No erythema, tenderness or bleeding in the vagina. Vaginal discharge (scant) found.    Genitourinary Comments: Prolapse noted to introitus; mild rectocele  Musculoskeletal: Normal range of motion. She exhibits no edema.  Neurological: She is alert. She exhibits normal muscle tone.  Skin: Skin is warm and dry. She is not diaphoretic. No pallor.  Psychiatric: She has a normal mood and affect. Her behavior is normal. Judgment and thought content normal.    Results for orders placed or performed in visit on 04/09/18  POCT Urinalysis Dipstick  Result Value Ref Range   Color, UA pale yellow    Clarity, UA clear    Glucose, UA neg    Bilirubin, UA neg    Ketones, UA neg    Spec Grav, UA 1.010 1.010 - 1.025   Blood, UA trace    pH, UA 7.0 5.0 - 8.0   Protein, UA neg    Urobilinogen, UA 0.2 0.2 or 1.0 E.U./dL   Nitrite, UA neg    Leukocytes, UA Small (1+) (A) Negative   Appearance normal    Odor none       Assessment & Plan:   Problem List Items Addressed This Visit      Cardiovascular and Mediastinum   Pulmonary embolism ([place])    Hx of PE in 2007; not limited in any way from breathing standpoint        Genitourinary   Chronic kidney disease (CKD), stage II (mild) - Primary    Avoid NSAIDs; stay hydrated; explained diagnosis, nothing to worry about at this time      Relevant Orders   CBC with Differential/Platelet   COMPLETE METABOLIC PANEL WITH GFR     Other   Urinary, incontinence, stress female    Suggested Kegels      Obesity (BMI 35.0-39.9 without comorbidity)    Encouraged modest weight loss; see AVS      Relevant Orders   Lipid panel   TSH    Other Visit Diagnoses    Screening for breast cancer       Relevant Orders   MM Digital Screening   Screening for osteoporosis       Relevant Orders   DG Bone Density   Vaginal discharge       Relevant Orders   POCT Urinalysis Dipstick (Completed)   WET PREP BY MOLECULAR PROBE   Bladder prolapse, female, acquired       Relevant Orders   Ambulatory referral to Obstetrics / Gynecology   Lower  abdominal pain       resolved with BM; some LE in urine; will check urine culture; refer to GYN   Leukocytes in urine  Relevant Orders   Urine Culture       Follow up plan: Return for Medicare Wellness check with Ammie.  An after-visit summary was printed and given to the patient at [place].  Please see the patient instructions which may contain other information and recommendations beyond what is mentioned above in the assessment and plan.  No orders of the defined types were placed in this encounter.   Orders Placed This Encounter  Procedures  . Urine Culture  . WET PREP BY MOLECULAR PROBE  . MM Digital Screening  . DG Bone Density  . CBC with Differential/Platelet  . COMPLETE METABOLIC PANEL WITH GFR  . Lipid panel  . TSH  . Ambulatory referral to Obstetrics / Gynecology  . POCT Urinalysis Dipstick

## 2018-04-09 NOTE — Assessment & Plan Note (Signed)
Suggested Kegels

## 2018-04-10 ENCOUNTER — Encounter: Payer: Medicare Other | Admitting: Obstetrics and Gynecology

## 2018-04-10 ENCOUNTER — Other Ambulatory Visit: Payer: Self-pay | Admitting: Family Medicine

## 2018-04-10 LAB — CBC WITH DIFFERENTIAL/PLATELET
Basophils Absolute: 50 cells/uL (ref 0–200)
Basophils Relative: 0.9 %
Eosinophils Absolute: 132 cells/uL (ref 15–500)
Eosinophils Relative: 2.4 %
HCT: 40.6 % (ref 35.0–45.0)
Hemoglobin: 13.9 g/dL (ref 11.7–15.5)
Lymphs Abs: 1254 cells/uL (ref 850–3900)
MCH: 28.2 pg (ref 27.0–33.0)
MCHC: 34.2 g/dL (ref 32.0–36.0)
MCV: 82.4 fL (ref 80.0–100.0)
MPV: 11.8 fL (ref 7.5–12.5)
Monocytes Relative: 6.4 %
Neutro Abs: 3713 cells/uL (ref 1500–7800)
Neutrophils Relative %: 67.5 %
Platelets: 257 10*3/uL (ref 140–400)
RBC: 4.93 10*6/uL (ref 3.80–5.10)
RDW: 13.6 % (ref 11.0–15.0)
Total Lymphocyte: 22.8 %
WBC mixed population: 352 cells/uL (ref 200–950)
WBC: 5.5 10*3/uL (ref 3.8–10.8)

## 2018-04-10 LAB — LIPID PANEL
Cholesterol: 160 mg/dL (ref <200)
HDL: 52 mg/dL (ref >50)
LDL Cholesterol (Calc): 87 mg/dL (calc)
Non-HDL Cholesterol (Calc): 108 mg/dL (calc) (ref <130)
Total CHOL/HDL Ratio: 3.1 (calc) (ref <5.0)
Triglycerides: 117 mg/dL (ref <150)

## 2018-04-10 LAB — WET PREP BY MOLECULAR PROBE
Candida species: NOT DETECTED
MICRO NUMBER:: 90460345
SPECIMEN QUALITY:: ADEQUATE
Trichomonas vaginosis: NOT DETECTED

## 2018-04-10 LAB — COMPLETE METABOLIC PANEL WITH GFR
AG Ratio: 1.3 (calc) (ref 1.0–2.5)
ALT: 18 U/L (ref 6–29)
AST: 20 U/L (ref 10–35)
Albumin: 4.3 g/dL (ref 3.6–5.1)
Alkaline phosphatase (APISO): 65 U/L (ref 33–130)
BUN: 13 mg/dL (ref 7–25)
CO2: 29 mmol/L (ref 20–32)
Calcium: 10.1 mg/dL (ref 8.6–10.4)
Chloride: 102 mmol/L (ref 98–110)
Creat: 0.79 mg/dL (ref 0.50–0.99)
GFR, Est African American: 89 mL/min/{1.73_m2} (ref >=60)
GFR, Est Non African American: 77 mL/min/{1.73_m2} (ref >=60)
Globulin: 3.4 g/dL (calc) (ref 1.9–3.7)
Glucose, Bld: 100 mg/dL — ABNORMAL HIGH (ref 65–99)
Potassium: 4.3 mmol/L (ref 3.5–5.3)
Sodium: 139 mmol/L (ref 135–146)
Total Bilirubin: 0.6 mg/dL (ref 0.2–1.2)
Total Protein: 7.7 g/dL (ref 6.1–8.1)

## 2018-04-10 LAB — URINE CULTURE
MICRO NUMBER:: 90460320
SPECIMEN QUALITY:: ADEQUATE

## 2018-04-10 MED ORDER — METRONIDAZOLE 0.75 % VA GEL: 1.0000 | Freq: Two times a day (BID) | VAGINAL | 0 refills | Status: DC

## 2018-04-10 NOTE — Progress Notes (Signed)
metro

## 2018-04-30 ENCOUNTER — Encounter: Payer: Self-pay | Admitting: Obstetrics and Gynecology

## 2018-04-30 ENCOUNTER — Ambulatory Visit (INDEPENDENT_AMBULATORY_CARE_PROVIDER_SITE_OTHER): Payer: Medicare Other | Admitting: Obstetrics and Gynecology

## 2018-04-30 VITALS — BP 130/82 | HR 73 | Ht 64.0 in | Wt 207.0 lb

## 2018-04-30 DIAGNOSIS — N819 Female genital prolapse, unspecified: Secondary | ICD-10-CM | POA: Diagnosis not present

## 2018-04-30 DIAGNOSIS — N950 Postmenopausal bleeding: Secondary | ICD-10-CM | POA: Diagnosis not present

## 2018-04-30 NOTE — Progress Notes (Signed)
Obstetrics & Gynecology Office Visit   Chief Complaint:  Chief Complaint  Patient presents with  . Referred by [facility]    Prolapse bladder    History of Present Illness:  Stacy Warren is a 67 y.o. female who presents in consultation at the request of Dr. Enid Derry at [facility] for evaluation of possible pelvic organ prolapse and stress urinary incontinence.   She reports that she has had the above symptoms for 1years  Urinary Leakage Symptoms:  She reports that she does have urinary incontinence.  She occasionally wear a pad.    Stress incontinence:  She does report stress urinary leakage 3 times a week and reports this to be moderate bother.  She has not previously been evaluated or treated  Urgency incontinence: She does not report urine leakage with urgency   Bladder Emptying/Upper Urinary Tract Symptoms:    Bladder emptying: She  She does not splint to accomplish voiding.    Upper tract symptoms: Shedenies a history of kidney stones and denies gross hematuria.   UTI: She denies a history of frequent urinary tract infections.  Bulge Symptoms:  She admits to pressure/bulge symptoms.    Bowel Symptoms:     Fecal incontinence: She denies encopresis, no problems with constipation  Defecatory dysfunction: She does not splint to accomplish a bowel movement.  She denies incomplete emptying.  No history of birth trauma.  Patient is a 67 y.o. [id number] presents evaluation of postmenopausal bleeding. The patient states she has had several episodes of bleeding in the past 1 year(s).   The bleeding has been limited to spotting . She describes the blood as dark brown in appearance.  She has hadno additional complaints. She denies trauma or other inciting event. She does not have a history of abnormal pap smears.  Her last pap smear was 8 months ago and was read as NIL and HR HPV negative .  The patient is not currently sexually active There are no other  aggravating factors reported. There are no alleviating factors reported. The patient's past medical history is noncontributory.   She has nothad prior work up for postmenopausal bleeding.  Review of Systems: Review of Systems  Constitutional: Negative.   HENT: Negative.   Eyes: Negative.   Respiratory: Negative.   Cardiovascular: Negative.   Gastrointestinal: Negative.   Genitourinary: Negative.   Musculoskeletal: Negative.   Skin: Negative.   Neurological: Negative.   Endo/Heme/Allergies: Negative.   Psychiatric/Behavioral: Negative.     Past Medical History:  Past Medical History:  Diagnosis Date  . Pulmonary embolism ([place]) 04/09/2018   Completed course of blood thinner in 2007; after femur fracture and surgery    Past Surgical History:  Past Surgical History:  Procedure Laterality Date  . HIP PINNING  2007  . LEG SURGERY  2007   plate    Gynecologic History: No LMP recorded. Patient is postmenopausal.  Obstetric History: [id number]  Family History:  Family History  Problem Relation Age of Onset  . Breast cancer Maternal Aunt 35  . Breast cancer Maternal Aunt 35  . Alzheimer's disease Mother   . Lymphoma Father   . Heart disease Father   . Thyroid disease Son     Social History:  Social History   Socioeconomic History  . Marital status: Married    Spouse name: Not on file  . Number of children: Not on file  . Years of education: Not on file  . Highest education level: Not on  file  Occupational History  . Not on file  Social Needs  . Financial resource strain: Not on file  . Food insecurity:    Worry: Not on file    Inability: Not on file  . Transportation needs:    Medical: Not on file    Non-medical: Not on file  Tobacco Use  . Smoking status: Never Smoker  . Smokeless tobacco: Never Used  Substance and Sexual Activity  . Alcohol use: No  . Drug use: No  . Sexual activity: Not Currently  Lifestyle  . Physical activity:    Days per week: Not  on file    Minutes per session: Not on file  . Stress: Not on file  Relationships  . Social connections:    Talks on phone: Not on file    Gets together: Not on file    Attends religious service: Not on file    Active member of club or organization: Not on file    Attends meetings of clubs or organizations: Not on file    Relationship status: Not on file  . Intimate partner violence:    Fear of current or ex partner: Not on file    Emotionally abused: Not on file    Physically abused: Not on file    Forced sexual activity: Not on file  Other Topics Concern  . Not on file  Social History Narrative  . Not on file    Allergies:  No Known Allergies  Medications: Prior to Admission medications   Medication Sig Start Date End Date Taking? Authorizing Provider  aspirin EC 81 MG tablet Take 325 mg by mouth once.    Yes [provider]  Calcium Carbonate-Vitamin D 600-400 MG-UNIT tablet Take 1 tablet by mouth daily.    Yes [provider]  cetirizine (ZYRTEC) 5 MG tablet Take 5 mg by mouth daily.   Yes [provider]  Cholecalciferol (VITAMIN D3) 1000 units CAPS Take 1 capsule by mouth daily.    Yes [provider]  glucosamine-chondroitin 500-400 MG tablet Take 1 tablet by mouth 2 (two) times daily.   Yes [provider]  metroNIDAZOLE (METROGEL VAGINAL) 0.75 % vaginal gel Place 1 Applicatorful vaginally 2 (two) times daily. 04/10/18  Yes Lada, Satira Anis, MD  Multiple Vitamins-Minerals (CENTRUM SILVER PO) Take 1 tablet by mouth daily.   Yes [provider]  TURMERIC PO Take 1 tablet by mouth every other day.   Yes [provider]  vitamin A 10000 UNIT capsule Take 10,000 Units by mouth 2 (two) times a week.   Yes [provider]  vitamin B-12 (CYANOCOBALAMIN) 1000 MCG tablet Take 1,000 mcg by mouth every other day.    Yes [provider]    Physical Exam Vitals:  Blood pressure 130/82, pulse 73, height  5\' 4"  (1.626 m), weight 207 lb (93.9 kg).  No LMP recorded. Patient is postmenopausal.  General: NAD HEENT: normocephalic, anicteric Pulmonary: No increased work of breathing Abdomen: soft, non-tender, non-distended.  Umbilicus without lesions.  No hepatomegaly, splenomegaly or masses palpable. No evidence of hernia  Genitourinary:  External: Normal external female genitalia.  Normal urethral meatus, normal Bartholin's and Skene's glands.    Vagina: Normal vaginal mucosa, stage 2 cystocele.  Poor Kegel.  Good posterior and apical support  Cervix: Grossly normal in appearance, no bleeding  Uterus: Non-enlarged, mobile, normal contour.  No CMT  Adnexa: ovaries non-enlarged, no adnexal masses  Rectal: deferred  Lymphatic: no evidence of  inguinal lymphadenopathy Extremities: no edema, erythema, or tenderness Neurologic: Grossly intact Psychiatric: mood appropriate, affect full  PVR: no checked  Female chaperone present for pelvic portions of the physical exam  Assessment: 66 y.o. [id number] presenting for evaluation of stress urinary incontinence, cystocele, and postmenopausal bleeding  Plan: Problem List Items Addressed This Visit    None    Visit Diagnoses    Postmenopausal bleeding    -  Primary   Relevant Orders   US Transvaginal Non-OB   Female genital prolapse, unspecified type         1) We discussed that menopause is a clinical diagnosis made after 12 months of amenorrhea.  The average age of menopause in the  General [place] population is 26 but there may be significant variation.  Any bleeding that happens after a 12 month period of amenorrhea warrants further work.  Possible etiologies of postmenopausal bleeding were discussed with the patient today.  These may range from benign etiologies such as urethral prolapse and atrophy, to indeterminate lesions such as submucosal fibroids or polyps which would require resection to accurately evaluate. The role of unopposed estrogen in the  development of endometrial hyperplasia or carcinoma is discussed.  The risk of endometrial hyperplasia is linearly correlated with increasing BMI given the production of estrone by adipose tissue.  Work up will be include transvaginal ultrasound to assess the thickness of the endometrial lining as well as to assess for focal uterine lesions.  Negative ultrasound evaluation, defined as the absence of focal lesions and endometrial stripe of <71mm, effectively rules out carcinoma and confirms atrophy as the most likely etiology.  Should focal lesions be present these generally require hysteroscopic resection.  Should lining be greater >77mm endometrial biopsy is warranted to rule out hyperplasia or frank endometrial cancer.  Continued episodes of bleeding despite negative ultrasound also warrant endometrial sampling.  As the cervical pathology may also be implicated in postmenopausal bleeding prior cervical cytology was reviewed and repeated if required per ASCCP guidelines.   2) Cystocele - discussed expectant management, pelvic PT which would mitigate some of SUI symptoms but not address prolapse, pessary, and surgical options.  The patient would like to avoid surgical options if possible but should she require surgical management of her PMB this may be accomplished concurrently.  If normal ultrasound consider pessary fitting at time of next visit.  3) A total of 30 minutes were spent in face-to-face contact with the patient during this encounter with over half of that time devoted to counseling and coordination of care.   Malachy Mood, MD, [facility], [facility] 04/30/2018, 8:25 PM

## 2018-05-14 ENCOUNTER — Ambulatory Visit (INDEPENDENT_AMBULATORY_CARE_PROVIDER_SITE_OTHER): Payer: Medicare Other

## 2018-05-14 ENCOUNTER — Encounter: Payer: Self-pay | Admitting: Obstetrics and Gynecology

## 2018-05-14 ENCOUNTER — Ambulatory Visit: Payer: Medicare Other | Admitting: Obstetrics and Gynecology

## 2018-05-14 VITALS — BP 136/84 | HR 73 | Ht 64.0 in | Wt 208.0 lb

## 2018-05-14 DIAGNOSIS — N840 Polyp of corpus uteri: Secondary | ICD-10-CM | POA: Diagnosis not present

## 2018-05-14 DIAGNOSIS — N950 Postmenopausal bleeding: Secondary | ICD-10-CM

## 2018-05-14 NOTE — Progress Notes (Signed)
Gynecology Ultrasound Follow Up  Chief Complaint:  Chief Complaint  Patient presents with  . Follow-up    GYN U/S possible pessary fitting     History of Present Illness: Patient is a 67 y.o. female who presents today for ultrasound evaluation of postmenopausal bleeding.  Ultrasound demonstrates the following findgins Adnexa: normal consistency Uterus: Non-enlarged with endometrial stripe  75mm, irregular with cystic spaces vs polypoid area Additional: no free fluid  Review of Systems: Review of Systems  Constitutional: Negative.   Gastrointestinal: Negative.     Past Medical History:  Past Medical History:  Diagnosis Date  . Pulmonary embolism ([place]) 04/09/2018   Completed course of blood thinner in 2007; after femur fracture and surgery    Past Surgical History:  Past Surgical History:  Procedure Laterality Date  . HIP PINNING  2007  . LEG SURGERY  2007   plate    Gynecologic History:  No LMP recorded. Patient is postmenopausal.  Family History:  Family History  Problem Relation Age of Onset  . Breast cancer Maternal Aunt 35  . Breast cancer Maternal Aunt 35  . Alzheimer's disease Mother   . Lymphoma Father   . Heart disease Father   . Thyroid disease Son     Social History:  Social History   Socioeconomic History  . Marital status: Married    Spouse name: Not on file  . Number of children: Not on file  . Years of education: Not on file  . Highest education level: Not on file  Occupational History  . Not on file  Social Needs  . Financial resource strain: Not on file  . Food insecurity:    Worry: Not on file    Inability: Not on file  . Transportation needs:    Medical: Not on file    Non-medical: Not on file  Tobacco Use  . Smoking status: Never Smoker  . Smokeless tobacco: Never Used  Substance and Sexual Activity  . Alcohol use: No  . Drug use: No  . Sexual activity: Not Currently  Lifestyle  . Physical activity:    Days per week:  Not on file    Minutes per session: Not on file  . Stress: Not on file  Relationships  . Social connections:    Talks on phone: Not on file    Gets together: Not on file    Attends religious service: Not on file    Active member of club or organization: Not on file    Attends meetings of clubs or organizations: Not on file    Relationship status: Not on file  . Intimate partner violence:    Fear of current or ex partner: Not on file    Emotionally abused: Not on file    Physically abused: Not on file    Forced sexual activity: Not on file  Other Topics Concern  . Not on file  Social History Narrative  . Not on file    Allergies:  No Known Allergies  Medications: Prior to Admission medications   Medication Sig Start Date End Date Taking? Authorizing Provider  aspirin EC 81 MG tablet Take 325 mg by mouth once.     [provider]  Calcium Carbonate-Vitamin D 600-400 MG-UNIT tablet Take 1 tablet by mouth daily.     [provider]  cetirizine (ZYRTEC) 5 MG tablet Take 5 mg by mouth daily.    [provider]  Cholecalciferol (VITAMIN D3) 1000 units CAPS Take  1 capsule by mouth daily.     [provider]  glucosamine-chondroitin 500-400 MG tablet Take 1 tablet by mouth 2 (two) times daily.    [provider]  metroNIDAZOLE (METROGEL VAGINAL) 0.75 % vaginal gel Place 1 Applicatorful vaginally 2 (two) times daily. 04/10/18   Arnetha Courser, MD  Multiple Vitamins-Minerals (CENTRUM SILVER PO) Take 1 tablet by mouth daily.    [provider]  TURMERIC PO Take 1 tablet by mouth every other day.    [provider]  vitamin A 10000 UNIT capsule Take 10,000 Units by mouth 2 (two) times a week.    [provider]  vitamin B-12 (CYANOCOBALAMIN) 1000 MCG tablet Take 1,000 mcg by mouth every other day.     [provider]    Physical Exam Vitals: Blood pressure 136/84, pulse 73, height 5\' 4"  (1.626 m), weight 208  lb (94.3 kg).  General: NAD HEENT: normocephalic, anicteric Pulmonary: No increased work of breathing Extremities: no edema, erythema, or tenderness Neurologic: Grossly intact, normal gait Psychiatric: mood appropriate, affect full  US Transvaginal Non-ob  Result Date: 05/15/2018 ULTRASOUND REPORT Location: [facility] Date of Service: 05/14/2018 Patient Name: Stacy Warren DOB: August 17, 1951 MRN: [id number] Indications:PMB Findings: The uterus is anteverted and measures 7.81 X 4.81 X 3.34 CM. Echo texture is heterogenous without evidence of focal masses. The Endometrium measures 8.39 mm. - Multiple cystic area present in endometrium Right Ovary measures 1.88 X 1.53 X 1.10 cm. It is normal in appearance. Left Ovary measures 1.65 X 1.21 X 0.91 cm. It is normal in appearance. Survey of the adnexa demonstrates no adnexal masses. There is no free fluid in the cul de sac. Impression: 1. Anteverted uterus with heterogenous texture. 2.  Multiple cystic area present in endome Recommendations: 1.Clinical correlation with the patient's History and Physical Exam. Mital bahen Marlowe Sax, RDMS Images reviewed.  Thickened endometrial stripe in the setting of postmenopausal bleeding with what appears to be focal lesion likely multiple polyps. Malachy Mood, MD, [facility], [facility] 05/15/2018, 4:57 PM    Assessment: 66 y.o. G2P2002 follow up postmenopausal bleeding  Plan: Problem List Items Addressed This Visit    None    Visit Diagnoses    Postmenopausal bleeding    -  Primary   Endometrial polyp          1) We discussed that menopause is a clinical diagnosis made after 12 months of amenorrhea.  The average age of menopause in the  General [place] population is 15 but there may be significant variation.  Any bleeding that happens after a 12 month period of amenorrhea warrants further work.  Possible etiologies of postmenopausal bleeding were discussed with the patient today.  These  may range from benign etiologies such as urethral prolapse and atrophy, to indeterminate lesions such as submucosal fibroids or polyps which would require resection to accurately evaluate. The role of unopposed estrogen in the development of endometrial hyperplasia or carcinoma is discussed.  The risk of endometrial hyperplasia is linearly correlated with increasing BMI given the production of estrone by adipose tissue. Given thickened endometrial striped today endometrial sampling is indicated.  However, given focal lesions present these generally require hysteroscopic resection.   2) I have discussed with the patient the indications for the procedure. Included in the discussion were the options of therapy, as wall as their individual risks, benefits, and complications. Ample time was given to answer all questions.   In office pipelle  biopsy generally provides comparable results to [facility], however this sampling modality may miss focal lesions if these were previously documented on ultrasound.  It is because of the potential to miss focal lesions that hysteroscopy D&C is also warranted in patient with continued postmenopausal bleeding that is not self limited regardless of prior in office biopsy results or ultrasound findings.  She understands that the risk of continued observation include worsening bleeding or worsening of any underlying pathology.  The choices include: 1. Doing nothing but following her symptoms 2. Attempts at hormonal manipulation with either BCP or Depo-Provera for premenopausal patients with no concern for focal lesion or endometrial pathology 3. D&C/hysteroscopy. 4. Endometrial ablation via Novasure or other techniques for premenopausal patients with no concern for focal lesion or endometrial pathology  5. As final resort, hysterectomy. After consideration of her history and findings, mutual decision has been made to proceed with D+C/hysteroscopy. While the incidence is low, the risks from this  surgery include, but are not limited to, the risks of anesthesia, hemorrhage, infection, perforation, and injury to adjacent structures including bowel, bladder and blood vessels.   3) A total of 15 minutes were spent in face-to-face contact with the patient during this encounter with over half of that time devoted to counseling and coordination of care.  Malachy Mood, MD, [facility], [facility] 05/15/2018, 4:53 PM

## 2018-05-16 ENCOUNTER — Telehealth: Payer: Self-pay | Admitting: Obstetrics and Gynecology

## 2018-05-16 NOTE — Telephone Encounter (Signed)
-----   Message from Malachy Mood, MD sent at 05/15/2018  4:55 PM EDT ----- Regarding: surgery Surgery Date:   LOS: same day surgery  Surgery Booking Request Patient Full Name: Stacy Warren MRN: [id number]  DOB: February 01, 1951  Surgeon: Malachy Mood, MD  Requested Surgery Date and Time: 1-4 weeks Primary Diagnosis and Code: Postmenopausal bleeding Secondary Diagnosis and Code: Endometrial polyp Surgical Procedure: Hysteroscopy, D&C L&D Notification:N/A Admission Status: same day surgery Length of Surgery: 1h Special Case Needs: none H&P: week prior or day of (date) Phone Interview or Office Pre-Admit: pre-admit Interpreter: No Language: English Medical Clearance: No Special Scheduling Instructions:

## 2018-05-16 NOTE — Telephone Encounter (Signed)
Patient is aware of H&P at [facility] on 05/23/18 @ 8:30am, Pre-admit Testing afterwards, and OR on 06/07/18. Patient is aware to expect calls from the [facility] and [facility]. Ext given.

## 2018-05-23 ENCOUNTER — Encounter: Payer: Self-pay | Admitting: Obstetrics and Gynecology

## 2018-05-23 ENCOUNTER — Ambulatory Visit (INDEPENDENT_AMBULATORY_CARE_PROVIDER_SITE_OTHER): Payer: Medicare Other | Admitting: Obstetrics and Gynecology

## 2018-05-23 VITALS — BP 132/82 | HR 88 | Ht 64.0 in | Wt 207.0 lb

## 2018-05-23 DIAGNOSIS — Z01818 Encounter for other preprocedural examination: Secondary | ICD-10-CM

## 2018-05-23 DIAGNOSIS — N950 Postmenopausal bleeding: Secondary | ICD-10-CM

## 2018-05-23 DIAGNOSIS — N840 Polyp of corpus uteri: Secondary | ICD-10-CM

## 2018-05-23 NOTE — Progress Notes (Signed)
Obstetrics & Gynecology Surgery H&P    Chief Complaint: Scheduled Surgery   History of Present Illness: Patient is a 67 y.o. [id number] presenting for scheduled hysteroscopy, D&C, for the treatment or further evaluation of postmenopausal bleeding with focal lesion on ultrasound work up.   Prior Treatments prior to proceeding with surgery include: ultrasound evalation  Preoperative Pap: 02/22/2017 Results: no abnormalities  Preoperative Endometrial biopsy: N/A focal lesion Preoperative Ultrasound: 05/21/2019Findings: cystic endometrium   Review of Systems:10 point review of systems  Past Medical History:  Past Medical History:  Diagnosis Date  . Pulmonary embolism ([place]) 04/09/2018   Completed course of blood thinner in 2007; after femur fracture and surgery    Past Surgical History:  Past Surgical History:  Procedure Laterality Date  . HIP PINNING  2007  . LEG SURGERY  2007   plate    Family History:  Family History  Problem Relation Age of Onset  . Breast cancer Maternal Aunt 35  . Breast cancer Maternal Aunt 35  . Alzheimer's disease Mother   . Lymphoma Father   . Heart disease Father   . Thyroid disease Son     Social History:  Social History   Socioeconomic History  . Marital status: Married    Spouse name: Not on file  . Number of children: Not on file  . Years of education: Not on file  . Highest education level: Not on file  Occupational History  . Not on file  Social Needs  . Financial resource strain: Not on file  . Food insecurity:    Worry: Not on file    Inability: Not on file  . Transportation needs:    Medical: Not on file    Non-medical: Not on file  Tobacco Use  . Smoking status: Never Smoker  . Smokeless tobacco: Never Used  Substance and Sexual Activity  . Alcohol use: No  . Drug use: No  . Sexual activity: Not Currently  Lifestyle  . Physical activity:    Days per week: Not on file    Minutes per session: Not on file  . Stress:  Not on file  Relationships  . Social connections:    Talks on phone: Not on file    Gets together: Not on file    Attends religious service: Not on file    Active member of club or organization: Not on file    Attends meetings of clubs or organizations: Not on file    Relationship status: Not on file  . Intimate partner violence:    Fear of current or ex partner: Not on file    Emotionally abused: Not on file    Physically abused: Not on file    Forced sexual activity: Not on file  Other Topics Concern  . Not on file  Social History Narrative  . Not on file    Allergies:  No Known Allergies  Medications: Prior to Admission medications   Medication Sig Start Date End Date Taking? Authorizing Provider  aspirin EC 325 MG tablet Take 325 mg by mouth 3 (three) times a week.     [provider]  Calcium-Magnesium-Vitamin D (CALCIUM 1200+D3 PO) Take 1 tablet by mouth 2 (two) times a week.    [provider]  cetirizine (ZYRTEC) 10 MG tablet Take 10 mg by mouth 4 (four) times a week.     [provider]  Cholecalciferol (VITAMIN D3) 1000 units CAPS Take 2,000 Units by mouth 2 (two) times  a week.     [provider]  glucosamine-chondroitin 500-400 MG tablet Take 1 tablet by mouth 2 (two) times daily.    [provider]  metroNIDAZOLE (METROGEL VAGINAL) 0.75 % vaginal gel Place 1 Applicatorful vaginally 2 (two) times daily. Patient not taking: Reported on 05/22/2018 04/10/18   Arnetha Courser, MD  Multiple Vitamins-Minerals (CENTRUM SILVER PO) Take 1 tablet by mouth daily.    [provider]  Tetrahydrozoline HCl (VISINE OP) Place 2 drops into both eyes daily as needed (for dry eyes).    [provider]  TURMERIC CURCUMIN PO Take 1 capsule by mouth 3 (three) times a week.    [provider]  vitamin B-12 (CYANOCOBALAMIN) 1000 MCG tablet Take 1,000 mcg by mouth 4 (four) times a week.     [provider]     Physical Exam Vitals: Blood pressure 132/82, pulse 88, height 5\' 4"  (1.626 m), weight 207 lb (93.9 kg). General: NAD HEENT: normocephalic, anicteric Pulmonary: CTAB, No increased work of breathing Cardiovascular: RRR, distal pulses 2+ Abdomen: soft, non-tender, non-distended Genitourinary: deferred Extremities: no edema, erythema, or tenderness Neurologic: Grossly intact Psychiatric: mood appropriate, affect full  Imaging US Transvaginal Non-ob  Result Date: 05/15/2018 ULTRASOUND REPORT Location: [facility] Date of Service: 05/14/2018 Patient Name: Stacy Warren DOB: [birth year] MRN: [id number] Indications:PMB Findings: The uterus is anteverted and measures 7.81 X 4.81 X 3.34 CM. Echo texture is heterogenous without evidence of focal masses. The Endometrium measures 8.39 mm. - Multiple cystic area present in endometrium Right Ovary measures 1.88 X 1.53 X 1.10 cm. It is normal in appearance. Left Ovary measures 1.65 X 1.21 X 0.91 cm. It is normal in appearance. Survey of the adnexa demonstrates no adnexal masses. There is no free fluid in the cul de sac. Impression: 1. Anteverted uterus with heterogenous texture. 2.  Multiple cystic area present in endome Recommendations: 1.Clinical correlation with the patient's History and Physical Exam. Mital bahen Marlowe Sax, RDMS Images reviewed.  Thickened endometrial stripe in the setting of postmenopausal bleeding with what appears to be focal lesion likely multiple polyps. Malachy Mood, MD, [facility], [facility] 05/15/2018, 4:57 PM    Assessment: 67 y.o. [id number] presenting for scheduled hysteroscopy, D&C for endometrial polyps  Plan: 1) I have discussed with the patient the indications for the procedure. Included in the discussion were the options of therapy, as wall as their individual risks, benefits, and complications. Ample time was given to answer all questions.   In office pipelle biopsy generally provides  comparable results to [facility], however this sampling modality may miss focal lesions if these were previously documented on ultrasound.  It is because of the potential to miss focal lesions that hysteroscopy D&C is also warranted in patient with continued postmenopausal bleeding that is not self limited regardless of prior in office biopsy results or ultrasound findings.  She understands that the risk of continued observation include worsening bleeding or worsening of any underlying pathology.  The choices include: 1. Doing nothing but following her symptoms 2. Attempts at hormonal manipulation with either BCP or Depo-Provera for premenopausal patients with no concern for focal lesion or endometrial pathology 3. D&C/hysteroscopy. 4. Endometrial ablation via Novasure or other techniques for premenopausal patients with no concern for focal lesion or endometrial pathology  5. As final resort, hysterectomy. After consideration of her history and findings, mutual decision has been made to proceed with D+C/hysteroscopy. While the incidence is low, the risks from this  surgery include, but are not limited to, the risks of anesthesia, hemorrhage, infection, perforation, and injury to adjacent structures including bowel, bladder and blood vessels.   2) Routine postoperative instructions were reviewed with the patient and her family in detail today including the expected length of recovery and likely postoperative course.  The patient concurred with the proposed plan, giving informed written consent for the surgery today.  Patient instructed on the importance of being NPO after midnight prior to her procedure.  If warranted preoperative prophylactic antibiotics and SCDs ordered on call to the OR to meet SCIP guidelines and adhere to recommendation laid forth in [place] Number 104 May 2009  "Antibiotic Prophylaxis for Gynecologic Procedures".     Malachy Mood, MD, [facility], [facility] 05/23/2018, 8:57 AM

## 2018-05-25 ENCOUNTER — Encounter
Admission: RE | Admit: 2018-05-25 | Discharge: 2018-05-25 | Disposition: A | Discharge status: Home / Self Care | Payer: Medicare Other | Source: Ambulatory Visit | Attending: Obstetrics and Gynecology | Admitting: Obstetrics and Gynecology

## 2018-05-25 ENCOUNTER — Other Ambulatory Visit: Payer: Self-pay

## 2018-05-25 DIAGNOSIS — Z01810 Encounter for preprocedural cardiovascular examination: Secondary | ICD-10-CM | POA: Diagnosis not present

## 2018-05-25 HISTORY — DX: Unspecified osteoarthritis, unspecified site: M19.90

## 2018-05-25 NOTE — Patient Instructions (Signed)
Your procedure is scheduled SA:YTKZS. 06/07/18 Report to Day Surgery. To find out your arrival time please call [phone] between [place] on Wed. 06/06/18.  Remember: Instructions that are not followed completely may result in serious medical risk, up to and including death, or upon the discretion of your surgeon and anesthesiologist your surgery may need to be rescheduled.     _X__ 1. Do not eat food after midnight the night before your procedure.                 No gum chewing or hard candies. You may drink clear liquids up to 2 hours                 before you are scheduled to arrive for your surgery- DO not drink clear                 liquids within 2 hours of the start of your surgery.                 Clear Liquids include:  water, apple juice without pulp, clear carbohydrate                 drink such as Clearfast of Gartorade, Black Coffee or Tea (Do not add                 anything to coffee or tea).  __X__2.  On the morning of surgery brush your teeth with toothpaste and water, you  may rinse your mouth with mouthwash if you wish.  Do not swallow any  toothpaste of mouthwash.     _X__ 3.  No Alcohol for 24 hours before or after surgery.   ___ 4.  Do Not Smoke or use e-cigarettes For 24 Hours Prior to Your Surgery.                 Do not use any chewable tobacco products for at least 6 hours prior to                 surgery.  ____  5.  Bring all medications with you on the day of surgery if instructed.   _x___  6.  Notify your doctor if there is any change in your medical condition      (cold, fever, infections).     Do not wear jewelry, make-up, hairpins, clips or nail polish. Do not wear lotions, powders, or perfumes. You may wear deodorant. Do not shave 48 hours prior to surgery. Men may shave face and neck. Do not bring valuables to the hospital.    [facility] is not responsible for any belongings or valuables.  Contacts, dentures or  bridgework may not be worn into surgery. Leave your suitcase in the car. After surgery it may be brought to your room. For patients admitted to the hospital, discharge time is determined by your treatment team.   Patients discharged the day of surgery will not be allowed to drive home.   Please read over the following fact sheets that you were given:    _x___ Take these medicines the morning of surgery with A SIP OF WATER:    1. cetirizine (ZYRTEC) 10 MG tablet  2.   3.   4.  5.  6.  ____ Fleet Enema (as directed)   ____ Use CHG Soap as directed  ____ Use inhalers on the day of surgery  ____ Stop metformin 2 days prior to surgery  ____ Take 1/2 of usual insulin dose the night before surgery. No insulin the morning          of surgery.   __x__ Stop aspirin on Friday 06/01/18 last dose  __x__ Stop Anti-inflammatories  ibuprofen or Aleve On 05/30/18 May take tylenol   __x__ Stop supplements until after surgery.  TURMERIC CURCUMIN PO,glucosamine-chondroitin 500-400 MG tablet on 05/30/18  ____ Bring C-Pap to the hospital.

## 2018-06-06 MED ORDER — FAMOTIDINE 20 MG PO TABS
20.0000 mg | ORAL_TABLET | Freq: Once | ORAL | Status: AC
  Administered 2018-06-07: 20 mg via ORAL

## 2018-06-06 MED ORDER — LACTATED RINGERS IV SOLN
INTRAVENOUS | Status: DC
  Administered 2018-06-07: 07:00:00 via INTRAVENOUS

## 2018-06-07 ENCOUNTER — Encounter: Payer: Self-pay | Admitting: *Deleted

## 2018-06-07 ENCOUNTER — Ambulatory Visit: Payer: Medicare Other | Admitting: Certified Registered"

## 2018-06-07 ENCOUNTER — Encounter
Admission: RE | Disposition: A | Discharge status: Home / Self Care | Payer: Self-pay | Source: Ambulatory Visit | Attending: Obstetrics and Gynecology

## 2018-06-07 ENCOUNTER — Ambulatory Visit
Admission: RE | Admit: 2018-06-07 | Discharge: 2018-06-07 | Disposition: A | Discharge status: Home / Self Care | Payer: Medicare Other | Source: Ambulatory Visit | Attending: Obstetrics and Gynecology | Admitting: Obstetrics and Gynecology

## 2018-06-07 ENCOUNTER — Other Ambulatory Visit: Payer: Self-pay

## 2018-06-07 DIAGNOSIS — N950 Postmenopausal bleeding: Secondary | ICD-10-CM | POA: Diagnosis not present

## 2018-06-07 DIAGNOSIS — Z9889 Other specified postprocedural states: Secondary | ICD-10-CM

## 2018-06-07 DIAGNOSIS — Z86711 Personal history of pulmonary embolism: Secondary | ICD-10-CM | POA: Insufficient documentation

## 2018-06-07 DIAGNOSIS — Z7982 Long term (current) use of aspirin: Secondary | ICD-10-CM | POA: Insufficient documentation

## 2018-06-07 DIAGNOSIS — N840 Polyp of corpus uteri: Secondary | ICD-10-CM | POA: Insufficient documentation

## 2018-06-07 HISTORY — PX: DILATION AND CURETTAGE OF UTERUS: SHX78

## 2018-06-07 HISTORY — PX: HYSTEROSCOPY WITH D & C: SHX1775

## 2018-06-07 SURGERY — DILATATION AND CURETTAGE /HYSTEROSCOPY
Anesthesia: General

## 2018-06-07 MED ORDER — IBUPROFEN 400 MG PO TABS: 400.0000 mg | ORAL_TABLET | Freq: Four times a day (QID) | ORAL | 0 refills | Status: DC | PRN

## 2018-06-07 MED ORDER — FENTANYL CITRATE (PF) 100 MCG/2ML IJ SOLN
INTRAMUSCULAR | Status: AC
  Filled 2018-06-07: qty 2

## 2018-06-07 MED ORDER — ONDANSETRON HCL 4 MG/2ML IJ SOLN
INTRAMUSCULAR | Status: DC | PRN
  Administered 2018-06-07: 4 mg via INTRAVENOUS

## 2018-06-07 MED ORDER — MIDAZOLAM HCL 2 MG/2ML IJ SOLN
INTRAMUSCULAR | Status: DC | PRN
  Administered 2018-06-07: 2 mg via INTRAVENOUS

## 2018-06-07 MED ORDER — FAMOTIDINE 20 MG PO TABS
ORAL_TABLET | ORAL | Status: AC
  Filled 2018-06-07: qty 1

## 2018-06-07 MED ORDER — FENTANYL CITRATE (PF) 100 MCG/2ML IJ SOLN
INTRAMUSCULAR | Status: DC | PRN
  Administered 2018-06-07 (×2): 50 ug via INTRAVENOUS

## 2018-06-07 MED ORDER — PROPOFOL 500 MG/50ML IV EMUL
INTRAVENOUS | Status: DC | PRN
  Administered 2018-06-07: 50 ug/kg/min via INTRAVENOUS

## 2018-06-07 MED ORDER — MIDAZOLAM HCL 2 MG/2ML IJ SOLN
INTRAMUSCULAR | Status: AC
  Filled 2018-06-07: qty 2

## 2018-06-07 MED ORDER — FENTANYL CITRATE (PF) 100 MCG/2ML IJ SOLN: 25.0000 ug | INTRAMUSCULAR | Status: DC | PRN

## 2018-06-07 MED ORDER — DEXAMETHASONE SODIUM PHOSPHATE 10 MG/ML IJ SOLN
INTRAMUSCULAR | Status: DC | PRN
  Administered 2018-06-07: 6 mg via INTRAVENOUS

## 2018-06-07 MED ORDER — PROPOFOL 10 MG/ML IV BOLUS
INTRAVENOUS | Status: AC
  Filled 2018-06-07: qty 20

## 2018-06-07 MED ORDER — ONDANSETRON HCL 4 MG/2ML IJ SOLN: 4.0000 mg | Freq: Once | INTRAMUSCULAR | Status: DC | PRN

## 2018-06-07 SURGICAL SUPPLY — 17 items
CATH ROBINSON RED A/P 16FR (CATHETERS) ×3 IMPLANT
ELECT REM PT RETURN 9FT ADLT (ELECTROSURGICAL) ×3
ELECTRODE REM PT RTRN 9FT ADLT (ELECTROSURGICAL) ×1 IMPLANT
GLOVE BIO SURGEON STRL SZ7 (GLOVE) ×9 IMPLANT
GLOVE INDICATOR 7.5 STRL GRN (GLOVE) ×9 IMPLANT
GOWN STRL REUS W/ TWL LRG LVL3 (GOWN DISPOSABLE) ×2 IMPLANT
GOWN STRL REUS W/TWL LRG LVL3 (GOWN DISPOSABLE) ×4
IV LACTATED RINGERS 1000ML (IV SOLUTION) ×3 IMPLANT
KIT TURNOVER CYSTO (KITS) ×3 IMPLANT
MYOSURE LITE POLYP REMOVAL (MISCELLANEOUS) ×3 IMPLANT
PACK DNC HYST (MISCELLANEOUS) ×3 IMPLANT
PAD OB MATERNITY 4.3X12.25 (PERSONAL CARE ITEMS) ×3 IMPLANT
PAD PREP 24X41 OB/GYN DISP (PERSONAL CARE ITEMS) ×3 IMPLANT
SEAL ROD LENS SCOPE MYOSURE (ABLATOR) ×3 IMPLANT
TOWEL OR 17X26 4PK STRL BLUE (TOWEL DISPOSABLE) ×3 IMPLANT
TUBING CONNECTING 10 (TUBING) ×2 IMPLANT
TUBING CONNECTING 10' (TUBING) ×1

## 2018-06-07 NOTE — Anesthesia Postprocedure Evaluation (Signed)
Anesthesia Post Note  Patient: Stacy Warren  Procedure(s) Performed: DILATATION AND CURETTAGE /HYSTEROSCOPY/MYOSURE POLYPECTOMY (N/A )  Patient location during evaluation: PACU Anesthesia Type: General Level of consciousness: awake and alert Pain management: pain level controlled Vital Signs Assessment: post-procedure vital signs reviewed and stable Respiratory status: spontaneous breathing, nonlabored ventilation, respiratory function stable and patient connected to nasal cannula oxygen Cardiovascular status: blood pressure returned to baseline and stable Postop Assessment: no apparent nausea or vomiting Anesthetic complications: no     Last Vitals:  Vitals:   06/07/18 1018 06/07/18 1045  BP: 120/63 126/75  Pulse: (!) 56 (!) 56  Resp: 14 16  Temp: (!) 36.1 C (!) 36 C  SpO2: 99% 98%    Last Pain:  Vitals:   06/07/18 1045  TempSrc: Temporal  PainSc: 0-No pain                 Martha Clan

## 2018-06-07 NOTE — Transfer of Care (Signed)
Immediate Anesthesia Transfer of Care Note  Patient: Stacy Warren  Procedure(s) Performed: DILATATION AND CURETTAGE /HYSTEROSCOPY/MYOSURE POLYPECTOMY (N/A )  Patient Location: PACU  Anesthesia Type:MAC  Level of Consciousness: awake, alert  and oriented  Airway & Oxygen Therapy: Patient Spontanous Breathing  Post-op Assessment: Report given to RN and Post -op Vital signs reviewed and stable  Post vital signs: Reviewed and stable  Last Vitals:  Vitals Value Taken Time  BP    Temp    Pulse    Resp    SpO2      Last Pain:  Vitals:   06/07/18 0719  TempSrc: Tympanic  PainSc: 0-No pain         Complications: No apparent anesthesia complications

## 2018-06-07 NOTE — Anesthesia Preprocedure Evaluation (Addendum)
Anesthesia Evaluation  Patient identified by MRN, date of birth, ID band Patient awake    Reviewed: Allergy & Precautions, H&P , NPO status , Patient's Chart, lab work & pertinent test results, reviewed documented beta blocker date and time   Airway Mallampati: II  TM Distance: >3 FB Neck ROM: full    Dental  (+) Dental Advidsory Given   Pulmonary neg pulmonary ROS,           Cardiovascular Exercise Tolerance: Good negative cardio ROS       Neuro/Psych negative neurological ROS  negative psych ROS   GI/Hepatic negative GI ROS, Neg liver ROS,   Endo/Other  negative endocrine ROS  Renal/GU CRFRenal disease  negative genitourinary   Musculoskeletal   Abdominal   Peds  Hematology negative hematology ROS (+)   Anesthesia Other Findings Past Medical History: No date: Arthritis 04/09/2018: Pulmonary embolism ([place])     Comment:  Completed course of blood thinner in 2007; after femur               fracture and surgery   Reproductive/Obstetrics negative OB ROS                             Anesthesia Physical Anesthesia Plan  ASA: II  Anesthesia Plan: General   Post-op Pain Management:    Induction: Intravenous  PONV Risk Score and Plan: 3 and Ondansetron, Dexamethasone and TIVA  Airway Management Planned: Simple Face Mask  Additional Equipment:   Intra-op Plan:   Post-operative Plan:   Informed Consent: I have reviewed the patients History and Physical, chart, labs and discussed the procedure including the risks, benefits and alternatives for the proposed anesthesia with the patient or authorized representative who has indicated his/her understanding and acceptance.   Dental Advisory Given  Plan Discussed with: Anesthesiologist, CRNA and Surgeon  Anesthesia Plan Comments:        Anesthesia Quick Evaluation

## 2018-06-07 NOTE — Discharge Instructions (Signed)

## 2018-06-07 NOTE — H&P (Signed)
Date of Initial H&P: 05/23/2018  History reviewed, patient examined, no change in status, stable for surgery.

## 2018-06-07 NOTE — Op Note (Signed)
Preoperative Diagnosis: 1) 67 y.o. with postmenopausal bleeding 2) Endometrial polyp on ultrasound  Postoperative Diagnosis: 1) 67 y.o. with postmenopausal bleeding 2) Endometrial polyp on ultrasound   Operation Performed: Hysteroscopy, dilation and curettage  Indication: endometrial polyp  Anesthesia: MAC  Primary Surgeon: Malachy Mood, MD  Assistant: none  Preoperative Antibiotics: none  Estimated Blood Loss:minimal  IV Fluids: 834m  Urine Output:: ~252mstraight cath  Drains or Tubes: none  Implants: none  Specimens Removed: endometrial curettings/polyp  Complications: none  Intraoperative Findings: Exam under anesthesia revealed small, mobile uterus with no masses and bilateral adnexa without masses or fullness. There was significant uterine prolapse and cervical stenosis.  Hysteroscopy revealed endometrial polyp arising from the anterior fundal portion of the uterus, otherwise grossly normal appearing uterine cavity with bilateral tubal ostia and normal appearing endocervical canal.   Patient Condition: stable  Procedure in Detail:  Patient was taken to the operating room were she was administered general endotracheal anesthesia.  She was positioned in the dorsal lithotomy position utilizing Allen stirups, prepped and draped in the usual sterile fashion.  Uterus was noted to be retroverted and small.   Prior to proceeding with the case a time out was performed.  Attention was turned to the patient's pelvis.  A red rubber catheter was used to empty the patient's bladder.  An operative speculum was placed to allow visualization of the cervix.  The anterior lip of the cervix was grasped with a single tooth tenaculum and the cervix was sequentially dilated initially using lacrimal duct dilators given significant cervical stenosis then using pratt dilators.  The hysteroscope was then advanced into the uterine cavity noting the above findings.  Myosure device was used to  resect the endometrial polyp and obtain random 4 quadrant curettage.  Specimen collected and sent to pathology.    The single tooth tenaculum was removed from the cervix.  The tenaculum sites and cervix were noted to be  Hemostatic before removing the operative speculum.  Sponge needle and instrument counts were corrects times two.  The patient tolerated the procedure well and was taken to the recovery room in stable condition.

## 2018-06-07 NOTE — Anesthesia Post-op Follow-up Note (Signed)
Anesthesia QCDR form completed.        

## 2018-06-09 LAB — SURGICAL PATHOLOGY

## 2018-06-13 ENCOUNTER — Ambulatory Visit: Payer: Medicare Other | Admitting: Obstetrics and Gynecology

## 2018-06-19 ENCOUNTER — Ambulatory Visit: Payer: Medicare Other | Admitting: Obstetrics and Gynecology

## 2018-06-19 ENCOUNTER — Ambulatory Visit (INDEPENDENT_AMBULATORY_CARE_PROVIDER_SITE_OTHER): Payer: Medicare Other | Admitting: Obstetrics and Gynecology

## 2018-06-19 ENCOUNTER — Encounter: Payer: Self-pay | Admitting: Obstetrics and Gynecology

## 2018-06-19 VITALS — BP 144/70 | HR 93 | Ht 64.0 in | Wt 209.0 lb

## 2018-06-19 DIAGNOSIS — Z4889 Encounter for other specified surgical aftercare: Secondary | ICD-10-CM

## 2018-06-19 DIAGNOSIS — Z9889 Other specified postprocedural states: Secondary | ICD-10-CM

## 2018-06-19 NOTE — Progress Notes (Signed)
      Postoperative Follow-up Patient presents post op from hysteroscopy D&C 1weeks ago for PMB and endometrial polyp.  Subjective: Patient reports marked improvement in her preop symptoms. Eating a regular diet without difficulty. The patient is not having any pain.  Activity: normal activities of daily living. Still some occasional light spotting  Objective: Blood pressure (!) 144/70, pulse 93, height 5\' 4"  (1.626 m), weight 209 lb (94.8 kg).   Gen: NAD HEENT: normocephalic, anicteric Pulmonary: no increased work of breathing Neurologic: grossly intact  Admission on 06/07/2018, Discharged on 06/07/2018  Component Date Value Ref Range Status  . SURGICAL PATHOLOGY 06/07/2018    Final                   Value:Surgical Pathology CASE: [id number] PATIENT: [facility] Surgical Pathology Report     SPECIMEN SUBMITTED: A. Endometrial polyp and curettings  CLINICAL HISTORY: None provided  PRE-OPERATIVE DIAGNOSIS: Post menopausal bleeding  POST-OPERATIVE DIAGNOSIS: Endometrial polyp     DIAGNOSIS: A.  ENDOMETRIUM; POLYPECTOMY AND CURETTAGE: - ENDOMETRIAL POLYP, MULTIPLE FRAGMENTS. - SCANT ATROPHIC ENDOMETRIUM. - NEGATIVE FOR ATYPIA AND MALIGNANCY.   GROSS DESCRIPTION: A. Labeled: Endometrial polyp and curettings Received: In formalin Tissue fragment(s): Multiple Size: Aggregate, 2.4 x 2.1 x 0.2 cm Description: Rubbery pink-tan fragments Entirely submitted in one cassette.     Final Diagnosis performed by Bryan Lemma, MD.   Electronically signed 06/09/2018 2:23:13PM The electronic signature indicates that the named Attending Pathologist has evaluated the specimen  Technical component performed at [facility], [address] [place], [place] [address] Lab: 8                         [phone] Dir: Rush Farmer, MD, MMM  Professional component performed at [facility], [facility], [place], [place],  [address] Lab: [phone]  Dir: Dellia Nims. Reuel Derby, MD     Assessment: 67 y.o. s/p hysteroscopy, D&C stable  Plan: Patient has done well after surgery with no apparent complications.  I have discussed the post-operative course to date, and the expected progress moving forward.  The patient understands what complications to be concerned about.  I will see the patient in routine follow up, or sooner if needed.    Activity plan: No restriction.   Malachy Mood, MD, [facility], [facility] 06/19/2018, 11:35 AM

## 2018-07-26 ENCOUNTER — Ambulatory Visit (INDEPENDENT_AMBULATORY_CARE_PROVIDER_SITE_OTHER): Payer: Medicare Other | Admitting: Obstetrics and Gynecology

## 2018-07-26 ENCOUNTER — Encounter: Payer: Self-pay | Admitting: Obstetrics and Gynecology

## 2018-07-26 VITALS — BP 128/78 | HR 86 | Ht 64.0 in | Wt 207.0 lb

## 2018-07-26 DIAGNOSIS — Z4889 Encounter for other specified surgical aftercare: Secondary | ICD-10-CM

## 2018-07-26 NOTE — Progress Notes (Signed)
      Postoperative Follow-up Patient presents post op from hysteroscopy, D&C 6weeks ago for PMB - endometrial polyp.  Subjective: Patient reports marked improvement in her preop symptoms. Eating a regular diet without difficulty. The patient is not having any pain.  Activity: normal activities of daily living.  No further vaginal bleeding  Objective: Blood pressure 128/78, pulse 86, height 5\' 4"  (1.626 m), weight 207 lb (93.9 kg).  Gen: NAD HEENT: normocephalic, anicteric Pulmonary: no increased work of breathing GU: normal external female genitalia, anterior cystocele, cervix normal, uterus non-enlarged, non-tender Ext: no edema  Admission on 06/07/2018, Discharged on 06/07/2018  Component Date Value Ref Range Status  . SURGICAL PATHOLOGY 06/07/2018    Final                   Value:Surgical Pathology CASE: [id number] PATIENT: [facility] Surgical Pathology Report     SPECIMEN SUBMITTED: A. Endometrial polyp and curettings  CLINICAL HISTORY: None provided  PRE-OPERATIVE DIAGNOSIS: Post menopausal bleeding  POST-OPERATIVE DIAGNOSIS: Endometrial polyp     DIAGNOSIS: A.  ENDOMETRIUM; POLYPECTOMY AND CURETTAGE: - ENDOMETRIAL POLYP, MULTIPLE FRAGMENTS. - SCANT ATROPHIC ENDOMETRIUM. - NEGATIVE FOR ATYPIA AND MALIGNANCY.   GROSS DESCRIPTION: A. Labeled: Endometrial polyp and curettings Received: In formalin Tissue fragment(s): Multiple Size: Aggregate, 2.4 x 2.1 x 0.2 cm Description: Rubbery pink-tan fragments Entirely submitted in one cassette.     Final Diagnosis performed by Bryan Lemma, MD.   Electronically signed 06/09/2018 2:23:13PM The electronic signature indicates that the named Attending Pathologist has evaluated the specimen  Technical component performed at [facility], [address], [place], [place] [address] Lab: 8                         [phone] Dir: Rush Farmer, MD, MMM  Professional component performed at [facility], [facility], [place], [place], [place] [address] Lab: [phone] Dir: Dellia Nims. Rubinas, MD     Assessment: 67 y.o. s/p hysteroscopy, D&C for endometrial polyp stable  Plan: Patient has done well after surgery with no apparent complications.  I have discussed the post-operative course to date, and the expected progress moving forward.  The patient understands what complications to be concerned about.  I will see the patient in routine follow up, or sooner if needed.    Activity plan: No restriction.  Return in about 1 week (around 08/02/2018) for pessary fitting.    Malachy Mood, MD, [facility], [facility] 07/26/2018, 2:03 PM

## 2018-08-03 ENCOUNTER — Ambulatory Visit (INDEPENDENT_AMBULATORY_CARE_PROVIDER_SITE_OTHER): Payer: Medicare Other | Admitting: Obstetrics and Gynecology

## 2018-08-03 ENCOUNTER — Encounter: Payer: Self-pay | Admitting: Obstetrics and Gynecology

## 2018-08-03 VITALS — BP 132/78 | HR 81 | Ht 64.0 in | Wt 206.0 lb

## 2018-08-03 DIAGNOSIS — Z4689 Encounter for fitting and adjustment of other specified devices: Secondary | ICD-10-CM

## 2018-08-03 MED ORDER — ESTROGENS, CONJUGATED 0.625 MG/GM VA CREA: 1.0000 g | TOPICAL_CREAM | VAGINAL | 3 refills | Status: DC

## 2018-08-03 NOTE — Progress Notes (Signed)
Obstetrics & Gynecology Office Visit   Chief Complaint:  Chief Complaint  Patient presents with  . Pessary fitting    History of Present Illness: 67 year old presenting for initially pessary fitting.  She initially presented to [facility] and was diagnosed with prolapse.  She also reported PMB and was evaluated with hysteroscopy D&C with normal pathology on that procedure.  She continues to have prominent urinary symptoms and is interested in seeing if these may be improved with pessary.    Review of Systems: Review of Systems  Constitutional: Negative.   Gastrointestinal: Negative.   Genitourinary: Positive for frequency and urgency. Negative for dysuria, flank pain and hematuria.    Past Medical History:  Past Medical History:  Diagnosis Date  . Arthritis   . Pulmonary embolism ([place]) 04/09/2018   Completed course of blood thinner in 2007; after femur fracture and surgery    Past Surgical History:  Past Surgical History:  Procedure Laterality Date  . bunionectomy Left   . CERVICAL CONE BIOPSY  1984  . HIP PINNING  2007  . HYSTEROSCOPY W/D&C N/A 06/07/2018   Procedure: DILATATION AND CURETTAGE /HYSTEROSCOPY/MYOSURE POLYPECTOMY;  Surgeon: Malachy Mood, MD;  Location: [facility] ORS;  Service: Gynecology;  Laterality: N/A;  . LEG SURGERY  2007   plate  . TONSILLECTOMY    . VAGINAL DELIVERY     x2    Gynecologic History: No LMP recorded. Patient is postmenopausal.  Obstetric History: [id number]  Family History:  Family History  Problem Relation Age of Onset  . Breast cancer Maternal Aunt 35  . Breast cancer Maternal Aunt 35  . Alzheimer's disease Mother   . Lymphoma Father   . Heart disease Father   . Thyroid disease Son     Social History:  Social History   Socioeconomic History  . Marital status: Married    Spouse name: Not on file  . Number of children: Not on file  . Years of education: Not on file  . Highest education level: Not on file    Occupational History  . Not on file  Social Needs  . Financial resource strain: Not on file  . Food insecurity:    Worry: Not on file    Inability: Not on file  . Transportation needs:    Medical: Not on file    Non-medical: Not on file  Tobacco Use  . Smoking status: Never Smoker  . Smokeless tobacco: Never Used  Substance and Sexual Activity  . Alcohol use: No  . Drug use: No  . Sexual activity: Not Currently  Lifestyle  . Physical activity:    Days per week: Not on file    Minutes per session: Not on file  . Stress: Not on file  Relationships  . Social connections:    Talks on phone: Not on file    Gets together: Not on file    Attends religious service: Not on file    Active member of club or organization: Not on file    Attends meetings of clubs or organizations: Not on file    Relationship status: Not on file  . Intimate partner violence:    Fear of current or ex partner: Not on file    Emotionally abused: Not on file    Physically abused: Not on file    Forced sexual activity: Not on file  Other Topics Concern  . Not on file  Social History Narrative  . Not on file  Allergies:  No Known Allergies  Medications: Prior to Admission medications   Medication Sig Start Date End Date Taking? Authorizing Provider  acetaminophen (TYLENOL) 650 MG CR tablet Take 650 mg by mouth every 8 (eight) hours as needed for pain.   Yes [provider]  aspirin EC 325 MG tablet Take 325 mg by mouth 3 (three) times a week.    Yes [provider]  Calcium-Magnesium-Vitamin D (CALCIUM 1200+D3 PO) Take 1 tablet by mouth 2 (two) times a week.   Yes [provider]  cetirizine (ZYRTEC) 10 MG tablet Take 10 mg by mouth 4 (four) times a week.    Yes [provider]  Cholecalciferol (VITAMIN D3) 1000 units CAPS Take 2,000 Units by mouth 2 (two) times a week.    Yes [provider]  glucosamine-chondroitin 500-400 MG tablet Take 1 tablet by  mouth 2 (two) times daily.   Yes [provider]  ibuprofen (ADVIL,MOTRIN) 400 MG tablet Take 1 tablet (400 mg total) by mouth every 6 (six) hours as needed. 06/07/18  Yes Malachy Mood, MD  Multiple Vitamins-Minerals (CENTRUM SILVER PO) Take 1 tablet by mouth daily.   Yes [provider]  Tetrahydrozoline HCl (VISINE OP) Place 2 drops into both eyes daily as needed (for dry eyes).   Yes [provider]  TURMERIC CURCUMIN PO Take 1 capsule by mouth 3 (three) times a week.   Yes [provider]  vitamin B-12 (CYANOCOBALAMIN) 1000 MCG tablet Take 1,000 mcg by mouth 4 (four) times a week.    Yes [provider]  conjugated estrogens (PREMARIN) vaginal cream Place 0.5 Applicatorfuls vaginally 2 (two) times a week. 08/06/18   Malachy Mood, MD    Physical Exam Vitals:  Vitals:   08/03/18 0915  BP: 132/78  Pulse: 81   No LMP recorded. Patient is postmenopausal.  General: NAD HEENT: normocephalic, anicteric Pulmonary: No increased work of breathing Genitourinary:  External: Normal external female genitalia.  Normal urethral meatus, normal  Bartholin's and Skene's glands.    Vagina: Normal vaginal mucosa, stage II cystocele.  The prolapse is adequately reduced using a size 3 incontinence ring with support.    Cervix: Grossly normal in appearance, no bleeding  Uterus: Non-enlarged, mobile, normal contour.  No CMT  Adnexa: ovaries non-enlarged, no adnexal masses  Rectal: deferred  Lymphatic: no evidence of inguinal lymphadenopathy Extremities: no edema, erythema, or tenderness Neurologic: Grossly intact Psychiatric: mood appropriate, affect full  Female chaperone present for pelvic  portions of the physical exam  Assessment: Stacy Warren presenting for pessary fitting  Plan: Problem List Items Addressed This Visit    None    Visit Diagnoses    Encounter for fitting and adjustment of pessary    -  Primary     1) Patient fitted  with size 3 incontinence ring with support.  Good reduction of prolapse.  Good fitment even after moving, cough, and strain.  Follow up in 1 week to assess fitment and improvement in symptoms.  We discussed long term pessary care.  Rx premarin and samples provided  2) A total of 74minutes were spent in face-to-face contact with the patient during this encounter with over half of that time devoted to counseling and coordination of care.  3) Return in about 1 week (around 08/10/2018) for pessary check.   Malachy Mood, MD, [facility], [facility] 08/06/2018, 8:51 AM

## 2018-08-14 ENCOUNTER — Ambulatory Visit: Payer: Medicare Other | Admitting: Obstetrics and Gynecology

## 2018-08-14 ENCOUNTER — Encounter: Payer: Self-pay | Admitting: Obstetrics and Gynecology

## 2018-08-14 VITALS — BP 132/70 | HR 76 | Wt 207.0 lb

## 2018-08-14 DIAGNOSIS — Z4689 Encounter for fitting and adjustment of other specified devices: Secondary | ICD-10-CM

## 2018-08-14 NOTE — Progress Notes (Signed)
Obstetrics & Gynecology Office Visit   Chief Complaint:  Chief Complaint  Patient presents with  . Follow-up    Pessary fell out    History of Present Illness: Patient presents for 1 week pessary check.  Her size 3 incontinence ring with support did not remain in place.  She presents today to re-fit with plans to placed in Gelhorn type pessary.      Review of Systems: negative unless noted in HPI  Past Medical History:  Past Medical History:  Diagnosis Date  . Arthritis   . Pulmonary embolism ([place]) 04/09/2018   Completed course of blood thinner in 2007; after femur fracture and surgery    Past Surgical History:  Past Surgical History:  Procedure Laterality Date  . bunionectomy Left   . CERVICAL CONE BIOPSY  1984  . HIP PINNING  2007  . HYSTEROSCOPY W/D&C N/A 06/07/2018   Procedure: DILATATION AND CURETTAGE /HYSTEROSCOPY/MYOSURE POLYPECTOMY;  Surgeon: Malachy Mood, MD;  Location: [facility] ORS;  Service: Gynecology;  Laterality: N/A;  . LEG SURGERY  2007   plate  . TONSILLECTOMY    . VAGINAL DELIVERY     x2    Gynecologic History: No LMP recorded. Patient is postmenopausal.  Obstetric History: [id number]  Family History:  Family History  Problem Relation Age of Onset  . Breast cancer Maternal Aunt 35  . Breast cancer Maternal Aunt 35  . Alzheimer's disease Mother   . Lymphoma Father   . Heart disease Father   . Thyroid disease Son     Social History:  Social History   Socioeconomic History  . Marital status: Married    Spouse name: Not on file  . Number of children: Not on file  . Years of education: Not on file  . Highest education level: Not on file  Occupational History  . Not on file  Social Needs  . Financial resource strain: Not on file  . Food insecurity:    Worry: Not on file    Inability: Not on file  . Transportation needs:    Medical: Not on file    Non-medical: Not on file  Tobacco Use  . Smoking status: Never Smoker  . Smokeless  tobacco: Never Used  Substance and Sexual Activity  . Alcohol use: No  . Drug use: No  . Sexual activity: Not Currently  Lifestyle  . Physical activity:    Days per week: Not on file    Minutes per session: Not on file  . Stress: Not on file  Relationships  . Social connections:    Talks on phone: Not on file    Gets together: Not on file    Attends religious service: Not on file    Active member of club or organization: Not on file    Attends meetings of clubs or organizations: Not on file    Relationship status: Not on file  . Intimate partner violence:    Fear of current or ex partner: Not on file    Emotionally abused: Not on file    Physically abused: Not on file    Forced sexual activity: Not on file  Other Topics Concern  . Not on file  Social History Narrative  . Not on file    Allergies:  No Known Allergies  Medications: Prior to Admission medications   Medication Sig Start Date End Date Taking? Authorizing Provider  acetaminophen (TYLENOL) 650 MG CR tablet Take 650 mg by mouth every 8 (eight)  hours as needed for pain.   Yes [provider]  aspirin EC 325 MG tablet Take 325 mg by mouth 3 (three) times a week.    Yes [provider]  Calcium-Magnesium-Vitamin D (CALCIUM 1200+D3 PO) Take 1 tablet by mouth 2 (two) times a week.   Yes [provider]  cetirizine (ZYRTEC) 10 MG tablet Take 10 mg by mouth 4 (four) times a week.    Yes [provider]  Cholecalciferol (VITAMIN D3) 1000 units CAPS Take 2,000 Units by mouth 2 (two) times a week.    Yes [provider]  conjugated estrogens (PREMARIN) vaginal cream Place 0.5 Applicatorfuls vaginally 2 (two) times a week. 08/06/18  Yes Malachy Mood, MD  glucosamine-chondroitin 500-400 MG tablet Take 1 tablet by mouth 2 (two) times daily.   Yes [provider]  ibuprofen (ADVIL,MOTRIN) 400 MG tablet Take 1 tablet (400 mg total) by mouth every 6 (six) hours as needed.  06/07/18  Yes Malachy Mood, MD  Multiple Vitamins-Minerals (CENTRUM SILVER PO) Take 1 tablet by mouth daily.   Yes [provider]  Tetrahydrozoline HCl (VISINE OP) Place 2 drops into both eyes daily as needed (for dry eyes).   Yes [provider]  TURMERIC CURCUMIN PO Take 1 capsule by mouth 3 (three) times a week.   Yes [provider]  vitamin B-12 (CYANOCOBALAMIN) 1000 MCG tablet Take 1,000 mcg by mouth 4 (four) times a week.    Yes [provider]    Physical Exam Vitals:  Vitals:   08/14/18 0854  BP: 132/70  Pulse: 76   No LMP recorded. Patient is postmenopausal.  General: NAD HEENT: normocephalic, anicteric Pulmonary: No increased work of breathing  Genitourinary:  External: Normal external female genitalia.  Normal urethral meatus, normal  Bartholin's and Skene's glands.    Vagina: Normal vaginal mucosa, stage II procidentia size 3 gelhorn pessary placed without difficulty Extremities: no edema, erythema, or tenderness Neurologic: Grossly intact Psychiatric: mood appropriate, affect full  Female chaperone present for pelvic  portions of the physical exam  Assessment: 67 y.o. [id number] pessary placement  Plan: Problem List Items Addressed This Visit    None    Visit Diagnoses    Pessary maintenance    -  Primary     - Size 3 longstem gelhorn patient to wear tester in first week as we had two size 5gelhorns in the kit. Follow up 1 week  Malachy Mood, MD, [facility] 08/15/2018, 1:05 PM

## 2018-08-28 ENCOUNTER — Encounter: Payer: Self-pay | Admitting: Obstetrics and Gynecology

## 2018-08-28 ENCOUNTER — Ambulatory Visit (INDEPENDENT_AMBULATORY_CARE_PROVIDER_SITE_OTHER): Payer: Medicare Other | Admitting: Obstetrics and Gynecology

## 2018-08-28 VITALS — BP 134/88 | HR 86 | Wt 209.0 lb

## 2018-08-28 DIAGNOSIS — Z4689 Encounter for fitting and adjustment of other specified devices: Secondary | ICD-10-CM

## 2018-08-28 NOTE — Progress Notes (Signed)
Obstetrics & Gynecology Office Visit   Chief Complaint:  Chief Complaint  Patient presents with  . Follow-up    Pessary Check    History of Present Illness: Stacy Warren is a 67 y.o. female who is seen today for a follow-up appointment for a  pessary check. She is using a size 3 long stem gelhorn pessary for uterine prolapase  She Denies vaginal bleeding.  She Denies vaginal discharge.  She is voiding and defecating without difficulty.  Review of Systems: Review of Systems  Constitutional: Negative.   Gastrointestinal: Negative.   Genitourinary: Positive for urgency. Negative for dysuria, flank pain, frequency and hematuria.  Skin: Negative.   Psychiatric/Behavioral: Hallucinations:      Past Medical History:  Past Medical History:  Diagnosis Date  . Arthritis   . Pulmonary embolism ([place]) 04/09/2018   Completed course of blood thinner in 2007; after femur fracture and surgery    Past Surgical History:  Past Surgical History:  Procedure Laterality Date  . bunionectomy Left   . CERVICAL CONE BIOPSY  1984  . HIP PINNING  2007  . HYSTEROSCOPY W/D&C N/A 06/07/2018   Procedure: DILATATION AND CURETTAGE /HYSTEROSCOPY/MYOSURE POLYPECTOMY;  Surgeon: Malachy Mood, MD;  Location: [facility] ORS;  Service: Gynecology;  Laterality: N/A;  . LEG SURGERY  2007   plate  . TONSILLECTOMY    . VAGINAL DELIVERY     x2    Gynecologic History: No LMP recorded. Patient is postmenopausal.  Obstetric History: [id number]  Family History:  Family History  Problem Relation Age of Onset  . Breast cancer Maternal Aunt 35  . Breast cancer Maternal Aunt 35  . Alzheimer's disease Mother   . Lymphoma Father   . Heart disease Father   . Thyroid disease Son     Social History:  Social History   Socioeconomic History  . Marital status: Married    Spouse name: Not on file  . Number of children: Not on file  . Years of education: Not on file  . Highest education level: Not on file    Occupational History  . Not on file  Social Needs  . Financial resource strain: Not on file  . Food insecurity:    Worry: Not on file    Inability: Not on file  . Transportation needs:    Medical: Not on file    Non-medical: Not on file  Tobacco Use  . Smoking status: Never Smoker  . Smokeless tobacco: Never Used  Substance and Sexual Activity  . Alcohol use: No  . Drug use: No  . Sexual activity: Not Currently  Lifestyle  . Physical activity:    Days per week: Not on file    Minutes per session: Not on file  . Stress: Not on file  Relationships  . Social connections:    Talks on phone: Not on file    Gets together: Not on file    Attends religious service: Not on file    Active member of club or organization: Not on file    Attends meetings of clubs or organizations: Not on file    Relationship status: Not on file  . Intimate partner violence:    Fear of current or ex partner: Not on file    Emotionally abused: Not on file    Physically abused: Not on file    Forced sexual activity: Not on file  Other Topics Concern  . Not on file  Social History Narrative  .  Not on file    Allergies:  No Known Allergies  Medications: Prior to Admission medications   Medication Sig Start Date End Date Taking? Authorizing Provider  acetaminophen (TYLENOL) 650 MG CR tablet Take 650 mg by mouth every 8 (eight) hours as needed for pain.   Yes [provider]  aspirin EC 325 MG tablet Take 325 mg by mouth 3 (three) times a week.    Yes [provider]  Calcium-Magnesium-Vitamin D (CALCIUM 1200+D3 PO) Take 1 tablet by mouth 2 (two) times a week.   Yes [provider]  cetirizine (ZYRTEC) 10 MG tablet Take 10 mg by mouth 4 (four) times a week.    Yes [provider]  Cholecalciferol (VITAMIN D3) 1000 units CAPS Take 2,000 Units by mouth 2 (two) times a week.    Yes [provider]  conjugated estrogens (PREMARIN) vaginal cream Place 0.5  Applicatorfuls vaginally 2 (two) times a week. 08/06/18  Yes Malachy Mood, MD  glucosamine-chondroitin 500-400 MG tablet Take 1 tablet by mouth 2 (two) times daily.   Yes [provider]  ibuprofen (ADVIL,MOTRIN) 400 MG tablet Take 1 tablet (400 mg total) by mouth every 6 (six) hours as needed. 06/07/18  Yes Malachy Mood, MD  Multiple Vitamins-Minerals (CENTRUM SILVER PO) Take 1 tablet by mouth daily.   Yes [provider]  Tetrahydrozoline HCl (VISINE OP) Place 2 drops into both eyes daily as needed (for dry eyes).   Yes [provider]  TURMERIC CURCUMIN PO Take 1 capsule by mouth 3 (three) times a week.   Yes [provider]  vitamin B-12 (CYANOCOBALAMIN) 1000 MCG tablet Take 1,000 mcg by mouth 4 (four) times a week.    Yes [provider]    Physical Exam Vitals:  Vitals:   08/28/18 1003  BP: 134/88  Pulse: 86   No LMP recorded. Patient is postmenopausal.  General: NAD HEENT: normocephalic, anicteric Thyroid: no enlargement, no palpable nodules Pulmonary: No increased work of breathing Cardiovascular: RRR, distal pulses 2+ Abdomen: NABS, soft, non-tender, non-distended.  Umbilicus without lesions.  No hepatomegaly, splenomegaly or masses palpable. No evidence of hernia  Genitourinary:  External: Normal external female genitalia.  Normal urethral meatus, normal  Bartholin's and Skene's glands.    Vagina: pessary with good reduction in prolapse, has not moved.  No bleeding or erosions.  Extremities: no edema, erythema, or tenderness Neurologic: Grossly intact Psychiatric: mood appropriate, affect full  Female chaperone present for pelvic  portions of the physical exam  Assessment: 67 y.o. [id number] pessary check  Plan: Problem List Items Addressed This Visit    None    Visit Diagnoses    Pessary maintenance    -  Primary     - currently still using tester will need to replace once actual pessary come in - recommend  continued twice weekly premarin cream - follow up 3 months  Malachy Mood, MD, [place], [place] 08/29/2018, 1:30 PM

## 2018-09-24 ENCOUNTER — Encounter: Payer: Self-pay | Admitting: Family Medicine

## 2018-09-24 ENCOUNTER — Ambulatory Visit: Payer: Medicare Other | Admitting: Family Medicine

## 2018-09-24 VITALS — BP 124/76 | HR 86 | Temp 98.2°F | Ht 64.0 in | Wt 203.6 lb

## 2018-09-24 DIAGNOSIS — M545 Low back pain, unspecified: Secondary | ICD-10-CM

## 2018-09-24 DIAGNOSIS — E669 Obesity, unspecified: Secondary | ICD-10-CM | POA: Diagnosis not present

## 2018-09-24 DIAGNOSIS — M170 Bilateral primary osteoarthritis of knee: Secondary | ICD-10-CM | POA: Diagnosis not present

## 2018-09-24 DIAGNOSIS — N393 Stress incontinence (female) (male): Secondary | ICD-10-CM

## 2018-09-24 DIAGNOSIS — Z23 Encounter for immunization: Secondary | ICD-10-CM | POA: Diagnosis not present

## 2018-09-24 DIAGNOSIS — M1990 Unspecified osteoarthritis, unspecified site: Secondary | ICD-10-CM | POA: Insufficient documentation

## 2018-09-24 NOTE — Assessment & Plan Note (Signed)
Praise given for her weight loss efforts; see AVS; try to work on 1/2 pound to 1 pound down each week to get BMI under 30 as a first step; 174 pounds

## 2018-09-24 NOTE — Patient Instructions (Addendum)
Try turmeric as a natural anti-inflammatory (for pain and arthritis). It comes in capsules where you buy aspirin and fish oil, but also as a spice where you buy pepper and garlic powder.  Try Tylenol, up to 3,000 mg per day per package directions  Try topicals like biofreeze  Temperature (ice or heat), whichever helps  Therapy is recommended for the knees, so let me know if I can put in that referral  Check out the information at familydoctor.org entitled "Nutrition for Weight Loss: What You Need to Know about Fad Diets" Try to lose between 1-2 pounds per week by taking in fewer calories and burning off more calories You can succeed by limiting portions, limiting foods dense in calories and fat, becoming more active, and drinking 8 glasses of water a day (64 ounces) Don't skip meals, especially breakfast, as skipping meals may alter your metabolism Do not use over-the-counter weight loss pills or gimmicks that claim rapid weight loss A healthy BMI (or body mass index) is between 18.5 and 24.9 You can calculate your ideal BMI at the [place] website ClubMonetize.fr   Obesity, Adult Obesity is the condition of having too much total body fat. Being overweight or obese means that your weight is greater than what is considered healthy for your body size. Obesity is determined by a measurement called BMI. BMI is an estimate of body fat and is calculated from height and weight. For adults, a BMI of 30 or higher is considered obese. Obesity can eventually lead to other health concerns and major illnesses, including:  Stroke.  Coronary artery disease (CAD).  Type 2 diabetes.  Some types of cancer, including cancers of the colon, breast, uterus, and gallbladder.  Osteoarthritis.  High blood pressure (hypertension).  High cholesterol.  Sleep apnea.  Gallbladder stones.  Infertility problems.  What are the causes? The main cause of  obesity is taking in (consuming) more calories than your body uses for energy. Other factors that contribute to this condition may include:  Being born with genes that make you more likely to become obese.  Having a medical condition that causes obesity. These conditions include: ? Hypothyroidism. ? Polycystic ovarian syndrome (PCOS). ? Binge-eating disorder. ? Cushing syndrome.  Taking certain medicines, such as steroids, antidepressants, and seizure medicines.  Not being physically active (sedentary lifestyle).  Living where there are limited places to exercise safely or buy healthy foods.  Not getting enough sleep.  What increases the risk? The following factors may increase your risk of this condition:  Having a family history of obesity.  Being a woman of African-American descent.  Being a man of Hispanic descent.  What are the signs or symptoms? Having excessive body fat is the main symptom of this condition. How is this diagnosed? This condition may be diagnosed based on:  Your symptoms.  Your medical history.  A physical exam. Your health care provider may measure: ? Your BMI. If you are an adult with a BMI between 25 and less than 30, you are considered overweight. If you are an adult with a BMI of 30 or higher, you are considered obese. ? The distances around your hips and your waist (circumferences). These may be compared to each other to help diagnose your condition. ? Your skinfold thickness. Your health care provider may gently pinch a fold of your skin and measure it.  How is this treated? Treatment for this condition often includes changing your lifestyle. Treatment may include some or all of the following:  Dietary changes. Work with your health care provider and a dietitian to set a weight-loss goal that is healthy and reasonable for you. Dietary changes may include eating: ? Smaller portions. A portion size is the amount of a particular food that is  healthy for you to eat at one time. This varies from person to person. ? Low-calorie or low-fat options. ? More whole grains, fruits, and vegetables.  Regular physical activity. This may include aerobic activity (cardio) and strength training.  Medicine to help you lose weight. Your health care provider may prescribe medicine if you are unable to lose 1 pound a week after 6 weeks of eating more healthily and doing more physical activity.  Surgery. Surgical options may include gastric banding and gastric bypass. Surgery may be done if: ? Other treatments have not helped to improve your condition. ? You have a BMI of 40 or higher. ? You have life-threatening health problems related to obesity.  Follow these instructions at home:  Eating and drinking   Follow recommendations from your health care provider about what you eat and drink. Your health care provider may advise you to: ? Limit fast foods, sweets, and processed snack foods. ? Choose low-fat options, such as low-fat milk instead of whole milk. ? Eat 5 or more servings of fruits or vegetables every day. ? Eat at home more often. This gives you more control over what you eat. ? Choose healthy foods when you eat out. ? Learn what a healthy portion size is. ? Keep low-fat snacks on hand. ? Avoid sugary drinks, such as soda, fruit juice, iced tea sweetened with sugar, and flavored milk. ? Eat a healthy breakfast.  Drink enough water to keep your urine clear or pale yellow.  Do not go without eating for long periods of time (do not fast) or follow a fad diet. Fasting and fad diets can be unhealthy and even dangerous. Physical Activity  Exercise regularly, as told by your health care provider. Ask your health care provider what types of exercise are safe for you and how often you should exercise.  Warm up and stretch before being active.  Cool down and stretch after being active.  Rest between periods of  activity. Lifestyle  Limit the time that you spend in front of your TV, computer, or video game system.  Find ways to reward yourself that do not involve food.  Limit alcohol intake to no more than 1 drink a day for nonpregnant women and 2 drinks a day for men. One drink equals 12 oz of beer, 5 oz of wine, or 1 oz of hard liquor. General instructions  Keep a weight loss journal to keep track of the food you eat and how much you exercise you get.  Take over-the-counter and prescription medicines only as told by your health care provider.  Take vitamins and supplements only as told by your health care provider.  Consider joining a support group. Your health care provider may be able to recommend a support group.  Keep all follow-up visits as told by your health care provider. This is important. Contact a health care provider if:  You are unable to meet your weight loss goal after 6 weeks of dietary and lifestyle changes. This information is not intended to replace advice given to you by your health care provider. Make sure you discuss any questions you have with your health care provider. Document Released: 01/19/2005 Document Revised: 05/16/2016 Document Reviewed: 09/30/2015 Elsevier Interactive Patient Education  2018 [place]  Osteoarthritis Osteoarthritis is a type of arthritis that affects tissue that covers the ends of bones in joints (cartilage). Cartilage acts as a cushion between the bones and helps them move smoothly. Osteoarthritis results when cartilage in the joints gets worn down. Osteoarthritis is sometimes called "wear and tear" arthritis. Osteoarthritis is the most common form of arthritis. It often occurs in older people. It is a condition that gets worse over time (a progressive condition). Joints that are most often affected by this condition are in:  Fingers.  Toes.  Hips.  Knees.  Spine, including neck and lower back.  What are the causes? This  condition is caused by age-related wearing down of cartilage that covers the ends of bones. What increases the risk? The following factors may make you more likely to develop this condition:  Older age.  Being overweight or obese.  Overuse of joints, such as in athletes.  Past injury of a joint.  Past surgery on a joint.  Family history of osteoarthritis.  What are the signs or symptoms? The main symptoms of this condition are pain, swelling, and stiffness in the joint. The joint may lose its shape over time. Small pieces of bone or cartilage may break off and float inside of the joint, which may cause more pain and damage to the joint. Small deposits of bone (osteophytes) may grow on the edges of the joint. Other symptoms may include:  A grating or scraping feeling inside the joint when you move it.  Popping or creaking sounds when you move.  Symptoms may affect one or more joints. Osteoarthritis in a major joint, such as your knee or hip, can make it painful to walk or exercise. If you have osteoarthritis in your hands, you might not be able to grip items, twist your hand, or control small movements of your hands and fingers (fine motor skills). How is this diagnosed? This condition may be diagnosed based on:  Your medical history.  A physical exam.  Your symptoms.  X-rays of the affected joint(s).  Blood tests to rule out other types of arthritis.  How is this treated? There is no cure for this condition, but treatment can help to control pain and improve joint function. Treatment plans may include:  A prescribed exercise program that allows for rest and joint relief. You may work with a physical therapist.  A weight control plan.  Pain relief techniques, such as: ? Applying heat and cold to the joint. ? Electric pulses delivered to nerve endings under the skin (transcutaneous electrical nerve stimulation, or TENS). ? Massage. ? Certain nutritional  supplements.  NSAIDs or prescription medicines to help relieve pain.  Medicine to help relieve pain and inflammation (corticosteroids). This can be given by mouth (orally) or as an injection.  Assistive devices, such as a brace, wrap, splint, specialized glove, or cane.  Surgery, such as: ? An osteotomy. This is done to reposition the bones and relieve pain or to remove loose pieces of bone and cartilage. ? Joint replacement surgery. You may need this surgery if you have very bad (advanced) osteoarthritis.  Follow these instructions at home: Activity  Rest your affected joints as directed by your health care provider.  Do not drive or use heavy machinery while taking prescription pain medicine.  Exercise as directed. Your health care provider or physical therapist may recommend specific types of exercise, such as: ? Strengthening exercises. These are done to strengthen the muscles that support joints  that are affected by arthritis. They can be performed with weights or with exercise bands to add resistance. ? Aerobic activities. These are exercises, such as brisk walking or water aerobics, that get your heart pumping. ? Range-of-motion activities. These keep your joints easy to move. ? Balance and agility exercises. Managing pain, stiffness, and swelling  If directed, apply heat to the affected area as often as told by your health care provider. Use the heat source that your health care provider recommends, such as a moist heat pack or a heating pad. ? If you have a removable assistive device, remove it as told by your health care provider. ? Place a towel between your skin and the heat source. If your health care provider tells you to keep the assistive device on while you apply heat, place a towel between the assistive device and the heat source. ? Leave the heat on for 20-30 minutes. ? Remove the heat if your skin turns bright red. This is especially important if you are unable to  feel pain, heat, or cold. You may have a greater risk of getting burned.  If directed, put ice on the affected joint: ? If you have a removable assistive device, remove it as told by your health care provider. ? Put ice in a plastic bag. ? Place a towel between your skin and the bag. If your health care provider tells you to keep the assistive device on during icing, place a towel between the assistive device and the bag. ? Leave the ice on for 20 minutes, 2-3 times a day. General instructions  Take over-the-counter and prescription medicines only as told by your health care provider.  Maintain a healthy weight. Follow instructions from your health care provider for weight control. These may include dietary restrictions.  Do not use any products that contain nicotine or tobacco, such as cigarettes and e-cigarettes. These can delay bone healing. If you need help quitting, ask your health care provider.  Use assistive devices as directed by your health care provider.  Keep all follow-up visits as told by your health care provider. This is important. Where to find more information:  [employer] of Arthritis and Musculoskeletal and Skin Diseases: www.niams.SouthExposed.es  [employer] on Aging: http://kim-miller.com/  American College of Rheumatology: www.rheumatology.org Contact a health care provider if:  Your skin turns red.  You develop a rash.  You have pain that gets worse.  You have a fever along with joint or muscle aches. Get help right away if:  You lose a lot of weight.  You suddenly lose your appetite.  You have night sweats. Summary  Osteoarthritis is a type of arthritis that affects tissue covering the ends of bones in joints (cartilage).  This condition is caused by age-related wearing down of cartilage that covers the ends of bones.  The main symptom of this condition is pain, swelling, and stiffness in the joint.  There is no cure for this condition, but  treatment can help to control pain and improve joint function. This information is not intended to replace advice given to you by your health care provider. Make sure you discuss any questions you have with your health care provider. Document Released: 12/12/2005 Document Revised: 08/15/2016 Document Reviewed: 08/15/2016 Elsevier Interactive Patient Education  [employer].

## 2018-09-24 NOTE — Assessment & Plan Note (Signed)
Under the care of GYN, pessary

## 2018-09-24 NOTE — Assessment & Plan Note (Signed)
Try Ts; weight loss for knees; let me know if any desire to PT for the knees

## 2018-09-24 NOTE — Progress Notes (Signed)
BP 124/76   Pulse 86   Temp 98.2 F (36.8 C) (Oral)   Ht 5\' 4"  (1.626 m)   Wt 203 lb 9.6 oz (92.4 kg)   SpO2 97%   BMI 34.95 kg/m    Subjective:    Patient ID: Stacy Warren, female    DOB: Oct 10, 1951, 67 y.o.   MRN: [id number]  HPI: Stacy Warren is a 67 y.o. female  Chief Complaint  Patient presents with  . Back Pain    lowe back pain    HPI Patient is here for an acute visit She has lower back pain, but she brought up her hands right away and says she thinks she has arhtirits; she isn't sure if she needed to see rheumatologist Both hands, whole hand; in her right, wrist is also involved Bothering her since [place] or August in her hands She was in a hotel and after the 4th night, her back started to hurt; that was just a few weeks ago; she thought it might be get better when she got hom to her own bed Has bad knees, needs surgery and has put it off for so long; maybe next year; sees Dr. Leanor Kail; right and left bothering equally I asked what she has tried; trying the 8 hour arthritis medicine OTC, maybe worked a couple of hours only No topicals tried Runs hot water over her hands in the morning and massages them to get them working; has her coffee and takes her glucosamine-chondroitin which seems to help I asked her more about her back; it did not bother her until 2-3 weeks ago; on the 4th night in the hotel, felt like she needed to switch beds; "just pain"; could stoop over but felt it when stooping over  Went to [place] and had the [facility] in June; he did a pessary for her bladder, installed the first one and it came out; current one is better; the pessary is causing urine to causing more of a spray; no odor to the urine, no burning  Getting flu vaccine today  Obesity; she started swimming in Feb; did that from Feb until June but has not gotten back; has never been on a cruise and going Sunday   Depression screen [facility] 2/9 09/24/2018 04/09/2018  Decreased  Interest 0 0  Down, Depressed, Hopeless 0 0  PHQ - 2 Score 0 0  Altered sleeping 0 -  Tired, decreased energy 0 -  Change in appetite 0 -  Feeling bad or failure about yourself  0 -  Trouble concentrating 0 -  Moving slowly or fidgety/restless 0 -  Suicidal thoughts 0 -  PHQ-9 Score 0 -  Difficult doing work/chores Not difficult at all -   Fall Risk  09/24/2018 04/09/2018  Falls in the past year? Yes No  Number falls in past yr: 1 -  Injury with Fall? No -    Relevant past medical, surgical, family and social history reviewed Past Medical History:  Diagnosis Date  . Arthritis   . Pulmonary embolism ([place]) 04/09/2018   Completed course of blood thinner in 2007; after femur fracture and surgery   Past Surgical History:  Procedure Laterality Date  . bunionectomy Left   . CERVICAL CONE BIOPSY  1984  . DILATION AND CURETTAGE OF UTERUS  06/07/2018  . HIP PINNING  2007  . HYSTEROSCOPY W/D&C N/A 06/07/2018   Procedure: DILATATION AND CURETTAGE /HYSTEROSCOPY/MYOSURE POLYPECTOMY;  Surgeon: Malachy Mood, MD;  Location: [facility] ORS;  Service:  Gynecology;  Laterality: N/A;  . LEG SURGERY  2007   plate  . TONSILLECTOMY    . VAGINAL DELIVERY     x2   Family History  Problem Relation Age of Onset  . Breast cancer Maternal Aunt 35  . Breast cancer Maternal Aunt 35  . Alzheimer's disease Mother   . Lymphoma Father   . Heart disease Father   . Thyroid disease Son    Social History   Tobacco Use  . Smoking status: Never Smoker  . Smokeless tobacco: Never Used  Substance Use Topics  . Alcohol use: No  . Drug use: No     Office Visit from 09/24/2018 in [facility]  AUDIT-C Score  0      Interim medical history since last visit reviewed. Allergies and medications reviewed  Review of Systems Per HPI unless specifically indicated above     Objective:    BP 124/76   Pulse 86   Temp 98.2 F (36.8 C) (Oral)   Ht 5\' 4"  (1.626 m)   Wt 203 lb 9.6 oz (92.4  kg)   SpO2 97%   BMI 34.95 kg/m   Wt Readings from Last 3 Encounters:  09/24/18 203 lb 9.6 oz (92.4 kg)  08/28/18 209 lb (94.8 kg)  08/14/18 207 lb (93.9 kg)    Physical Exam  Constitutional: She appears well-developed and well-nourished.  obese  HENT:  Mouth/Throat: Mucous membranes are normal.  Eyes: EOM are normal. No scleral icterus.  Cardiovascular: Normal rate and regular rhythm.  Pulmonary/Chest: Effort normal and breath sounds normal.  Musculoskeletal:       Right knee: She exhibits deformity (enlarged).       Left knee: She exhibits deformity (enlarged).       Lumbar back: She exhibits tenderness. She exhibits normal range of motion, no bony tenderness, no deformity and no spasm.  Some mild discomfort in the lower back with rotation at the waist left and right with extension at the waist; crepitus with active flex/ext of both knees  Neurological:  No LE weakness  Psychiatric: She has a normal mood and affect. Her behavior is normal.      Assessment & Plan:   Problem List Items Addressed This Visit      Musculoskeletal and Integument   Osteoarthritis    Try Ts; weight loss for knees; let me know if any desire to PT for the knees        Other   Urinary, incontinence, stress female    Under the care of GYN, pessary      Obesity (BMI 30.0-34.9)    Praise given for her weight loss efforts; see AVS; try to work on 1/2 pound to 1 pound down each week to get BMI under 30 as a first step; 174 pounds       Other Visit Diagnoses    Acute midline low back pain without sciatica    -  Primary   Relevant Orders   Urinalysis w microscopic + reflex cultur   Need for influenza vaccination       Relevant Orders   Flu vaccine HIGH DOSE PF (Fluzone High dose) (Completed)       Follow up plan: No follow-ups on file.  An after-visit summary was printed and given to the patient at [place].  Please see the patient instructions which may contain other information and  recommendations beyond what is mentioned above in the assessment and plan.  No orders of  the defined types were placed in this encounter.   Orders Placed This Encounter  Procedures  . Flu vaccine HIGH DOSE PF (Fluzone High dose)  . Urinalysis w microscopic + reflex cultur

## 2018-09-25 ENCOUNTER — Other Ambulatory Visit: Payer: Self-pay | Admitting: Family Medicine

## 2018-09-25 MED ORDER — NITROFURANTOIN MONOHYD MACRO 100 MG PO CAPS: 100.0000 mg | ORAL_CAPSULE | Freq: Two times a day (BID) | ORAL | 0 refills | Status: DC

## 2018-09-25 NOTE — Progress Notes (Signed)
Start antibiotics Called, left message ABX called in to [facility]; start ASAP If getting worse, go to urgent care or ER

## 2018-09-27 ENCOUNTER — Telehealth: Payer: Self-pay | Admitting: Family Medicine

## 2018-09-27 LAB — URINE CULTURE
MICRO NUMBER:: 91177251
SPECIMEN QUALITY:: ADEQUATE

## 2018-09-27 LAB — URINALYSIS W MICROSCOPIC + REFLEX CULTURE
Bilirubin Urine: NEGATIVE
Glucose, UA: NEGATIVE
Hgb urine dipstick: NEGATIVE
Hyaline Cast: NONE SEEN /LPF
Ketones, ur: NEGATIVE
Nitrites, Initial: NEGATIVE
Protein, ur: NEGATIVE
Specific Gravity, Urine: 1.012 (ref 1.001–1.03)
pH: 8 (ref 5.0–8.0)

## 2018-09-27 LAB — CULTURE INDICATED

## 2018-09-27 NOTE — Telephone Encounter (Signed)
Pt.notified

## 2018-09-27 NOTE — Telephone Encounter (Signed)
Copied from [place] [id number]. Topic: Quick Communication - See Telephone Encounter >> Sep 27, 2018  8:28 AM Burchel, Abbi R wrote: CRM for notification. See Telephone encounter for: 09/27/18.  Pt is requesting a call back from Dr Sanda Klein.  She has some questions about her U/A and medication.   Pt: [phone]

## 2018-10-16 ENCOUNTER — Encounter: Payer: Self-pay | Admitting: Family Medicine

## 2018-10-16 DIAGNOSIS — N3000 Acute cystitis without hematuria: Secondary | ICD-10-CM | POA: Insufficient documentation

## 2018-11-14 ENCOUNTER — Telehealth: Payer: Self-pay

## 2018-11-14 NOTE — Telephone Encounter (Signed)
Left another message for patient. Advised her to call the office for an appointment with a provider.  Message sent to Clarise Cruz, office scheduler, in case pt calls back.

## 2018-11-14 NOTE — Telephone Encounter (Signed)
Message left for patient to call me back.

## 2018-11-14 NOTE — Telephone Encounter (Signed)
Pt states she feels like she may have a bladder infection and requesting to speak with Maudie Mercury directly. fwding to [facility]

## 2018-11-15 ENCOUNTER — Telehealth: Payer: Self-pay

## 2018-11-15 NOTE — Telephone Encounter (Signed)
Copied from [place] #[id number]. Topic: Appointment Scheduling - Scheduling Inquiry for Clinic >> Nov 15, 2018  8:40 AM Conception Chancy, NT wrote: Reason for CRM: patient is calling and states she has a bladder infection with symptoms of a back ache and odor urine and a sinus infection with symptoms of pressure around eyes. Dr. Sanda Klein first available appointment is 11/30/18 and she does not want to wait until then. She also does not want to see another provider. Requesting a call back.

## 2018-11-15 NOTE — Telephone Encounter (Signed)
Notified she would have to be seen by someone.  But was able to get her in tomorrow with Dr. Sanda Klein

## 2018-11-16 ENCOUNTER — Encounter: Payer: Self-pay | Admitting: Family Medicine

## 2018-11-16 ENCOUNTER — Ambulatory Visit: Payer: Medicare Other | Admitting: Family Medicine

## 2018-11-16 VITALS — BP 128/72 | HR 74 | Temp 97.9°F | Ht 64.0 in | Wt 203.8 lb

## 2018-11-16 DIAGNOSIS — N3000 Acute cystitis without hematuria: Secondary | ICD-10-CM | POA: Diagnosis not present

## 2018-11-16 DIAGNOSIS — R350 Frequency of micturition: Secondary | ICD-10-CM | POA: Diagnosis not present

## 2018-11-16 DIAGNOSIS — M545 Low back pain, unspecified: Secondary | ICD-10-CM

## 2018-11-16 DIAGNOSIS — J0100 Acute maxillary sinusitis, unspecified: Secondary | ICD-10-CM

## 2018-11-16 DIAGNOSIS — H6983 Other specified disorders of Eustachian tube, bilateral: Secondary | ICD-10-CM

## 2018-11-16 LAB — POCT URINALYSIS DIPSTICK
Bilirubin, UA: NEGATIVE
Blood, UA: NEGATIVE
Glucose, UA: NEGATIVE
Ketones, UA: NEGATIVE
Nitrite, UA: NEGATIVE
Odor: NORMAL
Protein, UA: NEGATIVE
Spec Grav, UA: 1.015 (ref 1.010–1.025)
Urobilinogen, UA: 0.2 E.U./dL
pH, UA: 6 (ref 5.0–8.0)

## 2018-11-16 MED ORDER — SULFAMETHOXAZOLE-TRIMETHOPRIM 800-160 MG PO TABS: 1.0000 | ORAL_TABLET | Freq: Two times a day (BID) | ORAL | 0 refills | Status: DC

## 2018-11-16 NOTE — Progress Notes (Signed)
BP 128/72   Pulse 74   Temp 97.9 F (36.6 C) (Oral)   Ht 5\' 4"  (1.626 m)   Wt 203 lb 12.8 oz (92.4 kg)   SpO2 97%   BMI 34.98 kg/m    Subjective:    Patient ID: Stacy Warren, female    DOB: 04-13-1951, 67 y.o.   MRN: [id number]  HPI: Stacy Warren is a 67 y.o. female  Chief Complaint  Patient presents with  . Sinusitis    onset 1 week, symptoms include facial pressure  . Urinary Tract Infection    onset 2 weeks symptoms include low back pain and frequency    HPI Patient is here for two acute issues  She thinks she has a bladder infection; symptoms have been going on for 2 weeks; she also has low back pain and urinary frequency; urine has a strong odor; low grade fever maybe; trying to drink enough water, other beverages; no visible blood; recent UTI in September; had citrobacter freundii resistant to amoxicillin/clav and cefazolin; it was sensitive to TMP/SMX; offered referal and not interested in this time; she has a pessary  She has had issues with sinus congestion, facial pressure; not around the eyes or over the eyes; she has drainage now; drainage has been "green" earlier this week, but this morning it was "clear"; no real sore throat; ears are like a tunnel; no rash; no travel; she was on a cruise October 6-13, [place]    Depression screen [facility] 2/9 11/16/2018 09/24/2018 04/09/2018  Decreased Interest 0 0 0  Down, Depressed, Hopeless 0 0 0  PHQ - 2 Score 0 0 0  Altered sleeping 0 0 -  Tired, decreased energy 0 0 -  Change in appetite 0 0 -  Feeling bad or failure about yourself  0 0 -  Trouble concentrating 0 0 -  Moving slowly or fidgety/restless 0 0 -  Suicidal thoughts 0 0 -  PHQ-9 Score 0 0 -  Difficult doing work/chores Not difficult at all Not difficult at all -   Fall Risk  11/16/2018 09/24/2018 04/09/2018  Falls in the past year? 0 Yes No  Number falls in past yr: 0 1 -  Injury with Fall? - No -    Relevant past medical, surgical, family and  social history reviewed Past Medical History:  Diagnosis Date  . Arthritis   . Pulmonary embolism ([place]) 04/09/2018   Completed course of blood thinner in 2007; after femur fracture and surgery   Past Surgical History:  Procedure Laterality Date  . bunionectomy Left   . CERVICAL CONE BIOPSY  1984  . DILATION AND CURETTAGE OF UTERUS  06/07/2018  . HIP PINNING  2007  . HYSTEROSCOPY W/D&C N/A 06/07/2018   Procedure: DILATATION AND CURETTAGE /HYSTEROSCOPY/MYOSURE POLYPECTOMY;  Surgeon: Malachy Mood, MD;  Location: [facility] ORS;  Service: Gynecology;  Laterality: N/A;  . LEG SURGERY  2007   plate  . TONSILLECTOMY    . VAGINAL DELIVERY     x2   Family History  Problem Relation Age of Onset  . Breast cancer Maternal Aunt 35  . Breast cancer Maternal Aunt 35  . Alzheimer's disease Mother   . Lymphoma Father   . Heart disease Father   . Thyroid disease Son    Social History   Tobacco Use  . Smoking status: Never Smoker  . Smokeless tobacco: Never Used  Substance Use Topics  . Alcohol use: No  . Drug use: No  Office Visit from 11/16/2018 in [facility]  AUDIT-C Score  0      Interim medical history since last visit reviewed. Allergies and medications reviewed  Review of Systems Per HPI unless specifically indicated above     Objective:    BP 128/72   Pulse 74   Temp 97.9 F (36.6 C) (Oral)   Ht 5\' 4"  (1.626 m)   Wt 203 lb 12.8 oz (92.4 kg)   SpO2 97%   BMI 34.98 kg/m   Wt Readings from Last 3 Encounters:  11/16/18 203 lb 12.8 oz (92.4 kg)  09/24/18 203 lb 9.6 oz (92.4 kg)  08/28/18 209 lb (94.8 kg)    Physical Exam  Constitutional: She appears well-developed and well-nourished.  HENT:  Right Ear: Ear canal normal. A middle ear effusion is present.  Left Ear: Ear canal normal. A middle ear effusion is present.  Nose: Mucosal edema and rhinorrhea present.  Mouth/Throat: Mucous membranes are normal. No posterior oropharyngeal edema or  posterior oropharyngeal erythema.  Layered cloudy fluid behind both TMs  Eyes: EOM are normal. No scleral icterus.  Cardiovascular: Normal rate and regular rhythm.  Pulmonary/Chest: Effort normal and breath sounds normal.  Abdominal: She exhibits no distension. There is no tenderness.  Lymphadenopathy:    She has no cervical adenopathy.  Skin: She is not diaphoretic. No pallor.  Psychiatric: She has a normal mood and affect. Her behavior is normal.    Results for orders placed or performed in visit on 11/16/18  POCT urinalysis dipstick  Result Value Ref Range   Color, UA yellow    Clarity, UA clear    Glucose, UA Negative Negative   Bilirubin, UA neg    Ketones, UA neg    Spec Grav, UA 1.015 1.010 - 1.025   Blood, UA neg    pH, UA 6.0 5.0 - 8.0   Protein, UA Negative Negative   Urobilinogen, UA 0.2 0.2 or 1.0 E.U./dL   Nitrite, UA neg    Leukocytes, UA Large (3+) (A) Negative   Appearance clear    Odor normal       Assessment & Plan:   Problem List Items Addressed This Visit      Genitourinary   Acute cystitis - Primary    Other Visit Diagnoses    Low back pain, unspecified back pain laterality, unspecified chronicity, unspecified whether sciatica present       Relevant Orders   POCT urinalysis dipstick (Completed)   Urine Culture   Urine frequency       urine dip suggestive of acute cystitis; 2nd case in 2 months; offered referral to urologist; she declined; start antibiotics; consider pessary as contributing   Relevant Orders   POCT urinalysis dipstick (Completed)   Urine Culture   Acute non-recurrent maxillary sinusitis       would usually not treat with ABX, but treating UTI now   Relevant Medications   sulfamethoxazole-trimethoprim (BACTRIM DS,SEPTRA DS) 800-160 MG tablet   ETD (Eustachian tube dysfunction), bilateral       don't sniff; gentle blowing       Follow up plan: Return in about 2 weeks (around 11/30/2018) for CMA visit for POCT urine.  An  after-visit summary was printed and given to the patient at [facility].  Please see the patient instructions which may contain other information and recommendations beyond what is mentioned above in the assessment and plan.  Meds ordered this encounter  Medications  . sulfamethoxazole-trimethoprim (BACTRIM DS,SEPTRA  DS) 800-160 MG tablet    Sig: Take 1 tablet by mouth 2 (two) times daily.    Dispense:  14 tablet    Refill:  0    Orders Placed This Encounter  Procedures  . Urine Culture  . POCT urinalysis dipstick

## 2018-11-16 NOTE — Patient Instructions (Signed)
Start the antibiotics  Please do eat yogurt or kimchi or take a probiotic daily for the next month We want to replace the healthy germs in the gut If you notice foul, watery diarrhea in the next two months, schedule an appointment RIGHT AWAY or go to an urgent care or the emergency room if a holiday or over a weekend  Stay well-hydrated  Return in 2-3 weeks for a recheck of your urine with one of our CMAs to make sure this infection gets completely gone

## 2018-11-17 LAB — URINE CULTURE
MICRO NUMBER:: 91412307
SPECIMEN QUALITY:: ADEQUATE

## 2018-11-19 NOTE — Progress Notes (Signed)
Joelene Millin, please let the patient know that her urine culture did not grow out any one type of germ that seemed to be causing the infection; if she continues to have symptoms after finishing the antibiotics, please return for a repeat urine (POCT)

## 2018-11-27 ENCOUNTER — Ambulatory Visit: Payer: Medicare Other | Admitting: Obstetrics and Gynecology

## 2018-11-27 ENCOUNTER — Encounter: Payer: Self-pay | Admitting: Obstetrics and Gynecology

## 2018-11-27 VITALS — BP 118/76 | HR 76 | Wt 208.0 lb

## 2018-11-27 DIAGNOSIS — Z4689 Encounter for fitting and adjustment of other specified devices: Secondary | ICD-10-CM

## 2018-11-27 NOTE — Progress Notes (Signed)
Obstetrics & Gynecology Office Visit   Chief Complaint:  Chief Complaint  Patient presents with  . Follow-up    Pessary check    History of Present Illness: Stacy Warren is a 67 y.o. female who is seen today for a follow-up appointment for a  pessary check. She is using a 2 1/2 Gelhorn longstem pessary for uterine prolapse  She Denies vaginal bleeding.  She Denies vaginal discharge.  She is voiding and defecating without difficulty.   Review of Systems: Review of Systems  Constitutional: Negative.   Gastrointestinal: Negative.   Genitourinary: Negative.      Past Medical History:  Past Medical History:  Diagnosis Date  . Arthritis   . Pulmonary embolism ([place]) 04/09/2018   Completed course of blood thinner in 2007; after femur fracture and surgery    Past Surgical History:  Past Surgical History:  Procedure Laterality Date  . bunionectomy Left   . CERVICAL CONE BIOPSY  1984  . DILATION AND CURETTAGE OF UTERUS  06/07/2018  . HIP PINNING  2007  . HYSTEROSCOPY W/D&C N/A 06/07/2018   Procedure: DILATATION AND CURETTAGE /HYSTEROSCOPY/MYOSURE POLYPECTOMY;  Surgeon: Malachy Mood, MD;  Location: [facility] ORS;  Service: Gynecology;  Laterality: N/A;  . LEG SURGERY  2007   plate  . TONSILLECTOMY    . VAGINAL DELIVERY     x2    Gynecologic History: No LMP recorded. Patient is postmenopausal.  Obstetric History: [id number]  Family History:  Family History  Problem Relation Age of Onset  . Breast cancer Maternal Aunt 35  . Breast cancer Maternal Aunt 35  . Alzheimer's disease Mother   . Lymphoma Father   . Heart disease Father   . Thyroid disease Son     Social History:  Social History   Socioeconomic History  . Marital status: Married    Spouse name: Not on file  . Number of children: Not on file  . Years of education: Not on file  . Highest education level: Not on file  Occupational History  . Not on file  Social Needs  . Financial resource strain:  Not on file  . Food insecurity:    Worry: Not on file    Inability: Not on file  . Transportation needs:    Medical: Not on file    Non-medical: Not on file  Tobacco Use  . Smoking status: Never Smoker  . Smokeless tobacco: Never Used  Substance and Sexual Activity  . Alcohol use: No  . Drug use: No  . Sexual activity: Not Currently  Lifestyle  . Physical activity:    Days per week: Not on file    Minutes per session: Not on file  . Stress: Not on file  Relationships  . Social connections:    Talks on phone: Not on file    Gets together: Not on file    Attends religious service: Not on file    Active member of club or organization: Not on file    Attends meetings of clubs or organizations: Not on file    Relationship status: Not on file  . Intimate partner violence:    Fear of current or ex partner: Not on file    Emotionally abused: Not on file    Physically abused: Not on file    Forced sexual activity: Not on file  Other Topics Concern  . Not on file  Social History Narrative  . Not on file    Allergies:  No Known Allergies  Medications: Prior to Admission medications   Medication Sig Start Date End Date Taking? Authorizing Provider  acetaminophen (TYLENOL) 650 MG CR tablet Take 650 mg by mouth every 8 (eight) hours as needed for pain.   Yes [provider]  aspirin EC 325 MG tablet Take 325 mg by mouth 3 (three) times a week.    Yes [provider]  Calcium-Magnesium-Vitamin D (CALCIUM 1200+D3 PO) Take 1 tablet by mouth 2 (two) times a week.   Yes [provider]  cetirizine (ZYRTEC) 10 MG tablet Take 10 mg by mouth 4 (four) times a week.    Yes [provider]  Cholecalciferol (VITAMIN D3) 1000 units CAPS Take 2,000 Units by mouth 2 (two) times a week.    Yes [provider]  conjugated estrogens (PREMARIN) vaginal cream Place 0.5 Applicatorfuls vaginally 2 (two) times a week. 08/06/18  Yes Malachy Mood, MD    glucosamine-chondroitin 500-400 MG tablet Take 1 tablet by mouth 2 (two) times daily.   Yes [provider]  ibuprofen (ADVIL,MOTRIN) 400 MG tablet Take 1 tablet (400 mg total) by mouth every 6 (six) hours as needed. 06/07/18  Yes Malachy Mood, MD  Multiple Vitamins-Minerals (CENTRUM SILVER PO) Take 1 tablet by mouth daily.   Yes [provider]  Tetrahydrozoline HCl (VISINE OP) Place 2 drops into both eyes daily as needed (for dry eyes).   Yes [provider]  TURMERIC CURCUMIN PO Take 1 capsule by mouth 3 (three) times a week.   Yes [provider]  vitamin B-12 (CYANOCOBALAMIN) 1000 MCG tablet Take 1,000 mcg by mouth 4 (four) times a week.    Yes [provider]    Physical Exam Vitals:  Vitals:   11/27/18 1139  BP: 118/76  Pulse: 76   No LMP recorded. Patient is postmenopausal.  General: NAD HEENT: normocephalic, anicteric Pulmonary: No increased work of breathing Genitourinary:  External: Normal external female genitalia.  Normal urethral meatus, normal Bartholin's and Skene's glands.    Vagina: Normal vaginal mucosa, procedentia.    Cervix: Grossly normal in appearance, no bleeding  Uterus: Non-enlarged, mobile, normal contour.  No CMT  Adnexa: ovaries non-enlarged, no adnexal masses  Rectal: deferred  Lymphatic: no evidence of inguinal lymphadenopathy Extremities: no edema, erythema, or tenderness Neurologic: Grossly intact Psychiatric: mood appropriate, affect full  Female chaperone present for pelvic  portions of the physical exam  Assessment: 67 y.o. [id number] pessary check  Plan: Problem List Items Addressed This Visit    None    Visit Diagnoses    Pessary maintenance    -  Primary     - 2 1/2" gelhorn pessary placed in lieu of tester - No problems other than on UTI in past 3 months - continue premarin cream twice weekly - repeat pessary check in 3 months  Malachy Mood, MD, [place], [place]

## 2018-11-28 ENCOUNTER — Ambulatory Visit
Admission: RE | Admit: 2018-11-28 | Discharge: 2018-11-28 | Disposition: A | Discharge status: Home / Self Care | Payer: Medicare Other | Source: Ambulatory Visit | Attending: Family Medicine | Admitting: Family Medicine

## 2018-11-28 ENCOUNTER — Other Ambulatory Visit: Payer: Self-pay | Admitting: Family Medicine

## 2018-11-28 ENCOUNTER — Encounter: Payer: Self-pay | Admitting: Family Medicine

## 2018-11-28 DIAGNOSIS — M85852 Other specified disorders of bone density and structure, left thigh: Secondary | ICD-10-CM | POA: Insufficient documentation

## 2018-11-28 DIAGNOSIS — Z780 Asymptomatic menopausal state: Secondary | ICD-10-CM | POA: Insufficient documentation

## 2018-11-28 DIAGNOSIS — M8580 Other specified disorders of bone density and structure, unspecified site: Secondary | ICD-10-CM | POA: Insufficient documentation

## 2018-11-28 DIAGNOSIS — N6310 Unspecified lump in the right breast, unspecified quadrant: Secondary | ICD-10-CM

## 2018-11-28 DIAGNOSIS — R928 Other abnormal and inconclusive findings on diagnostic imaging of breast: Secondary | ICD-10-CM

## 2018-11-28 DIAGNOSIS — Z13820 Encounter for screening for osteoporosis: Secondary | ICD-10-CM | POA: Insufficient documentation

## 2018-11-28 DIAGNOSIS — Z1239 Encounter for other screening for malignant neoplasm of breast: Secondary | ICD-10-CM

## 2018-11-28 DIAGNOSIS — M85532 Aneurysmal bone cyst, left forearm: Secondary | ICD-10-CM | POA: Insufficient documentation

## 2018-11-28 DIAGNOSIS — Z1231 Encounter for screening mammogram for malignant neoplasm of breast: Secondary | ICD-10-CM | POA: Diagnosis not present

## 2018-11-28 HISTORY — DX: Other specified disorders of bone density and structure, unspecified site: M85.80

## 2018-11-28 NOTE — Progress Notes (Signed)
Joelene Millin, please let the patient know that: Your bone density shows that you have osteopenia, which means that your bone is thinner than it should be but not as far along as osteoporosis.  Your risk of a fracture is slightly elevated, but not to the point of requiring medication at this point. We should get another bone scan in two years to monitor. Please do practice fall precautions (don't get up on chairs or ladders to reach high things, don't go out on slippery steps in the winter, always use handrails when going up or down stairs, etc.). Try to get three servings of calcium a day (best in foods/drinks like kale, spinach, broccoli, almond milk, tofu, etc.). Try to get 1000 iu of OTC vitamin D3 daily or sun exposure for vitamin D.

## 2018-11-30 ENCOUNTER — Ambulatory Visit: Payer: Medicare Other

## 2018-12-04 ENCOUNTER — Telehealth: Payer: Self-pay

## 2018-12-04 NOTE — Telephone Encounter (Signed)
Please contact Stacy Warren, as their note says they will contact the patient Let them know that patient is calling and has not heard anything I have ordered the imaging for the RIGHT breast (diag mammo and [place]) and I don't see that they've been scheduled Then relate to patient if [facility] will call them and when She does need additional imaging

## 2018-12-04 NOTE — Telephone Encounter (Signed)
Copied from [facility] [id number]. Topic: General - Other >> Dec 04, 2018  1:57 PM Percell Belt A wrote: Reason for CRM: Pt called in just requesting a call back with her Mammogram results as soon has there are available.   Cell number is best contact number

## 2018-12-05 NOTE — Telephone Encounter (Signed)
Called [facility] they do reach out and let them know mammo is abnormal and additional imaging needed and will setup.  Pt notified

## 2018-12-07 ENCOUNTER — Ambulatory Visit
Admission: RE | Admit: 2018-12-07 | Discharge: 2018-12-07 | Disposition: A | Discharge status: Home / Self Care | Payer: Medicare Other | Source: Ambulatory Visit | Attending: Family Medicine | Admitting: Family Medicine

## 2018-12-07 DIAGNOSIS — N6310 Unspecified lump in the right breast, unspecified quadrant: Secondary | ICD-10-CM | POA: Diagnosis present

## 2018-12-07 DIAGNOSIS — R928 Other abnormal and inconclusive findings on diagnostic imaging of breast: Secondary | ICD-10-CM

## 2018-12-10 ENCOUNTER — Other Ambulatory Visit: Payer: Self-pay | Admitting: Family Medicine

## 2018-12-10 DIAGNOSIS — N6310 Unspecified lump in the right breast, unspecified quadrant: Secondary | ICD-10-CM

## 2018-12-10 DIAGNOSIS — R928 Other abnormal and inconclusive findings on diagnostic imaging of breast: Secondary | ICD-10-CM

## 2018-12-13 ENCOUNTER — Ambulatory Visit
Admission: RE | Admit: 2018-12-13 | Discharge: 2018-12-13 | Disposition: A | Discharge status: Home / Self Care | Payer: Medicare Other | Source: Ambulatory Visit | Attending: Family Medicine | Admitting: Family Medicine

## 2018-12-13 DIAGNOSIS — N6310 Unspecified lump in the right breast, unspecified quadrant: Secondary | ICD-10-CM | POA: Diagnosis present

## 2018-12-13 DIAGNOSIS — R928 Other abnormal and inconclusive findings on diagnostic imaging of breast: Secondary | ICD-10-CM | POA: Insufficient documentation

## 2018-12-13 HISTORY — PX: BREAST BIOPSY: SHX20

## 2018-12-14 ENCOUNTER — Other Ambulatory Visit: Payer: Self-pay | Admitting: Anatomic Pathology & Clinical Pathology

## 2018-12-14 NOTE — Progress Notes (Unsigned)
MDT

## 2018-12-17 ENCOUNTER — Encounter: Payer: Self-pay | Admitting: *Deleted

## 2018-12-17 ENCOUNTER — Other Ambulatory Visit: Payer: Self-pay | Admitting: Anatomic Pathology & Clinical Pathology

## 2018-12-17 DIAGNOSIS — C50911 Malignant neoplasm of unspecified site of right female breast: Secondary | ICD-10-CM

## 2018-12-17 LAB — SURGICAL PATHOLOGY

## 2018-12-17 NOTE — Progress Notes (Signed)
  Oncology Nurse Navigator Documentation  Navigator Location: [facility] (12/17/18 1500) Referral date to RadOnc/MedOnc: 12/21/18 (12/17/18 1500) )Navigator Encounter Type: Introductory phone call (12/17/18 1500)   Abnormal Finding Date: 12/07/18 (12/17/18 1500) Confirmed Diagnosis Date: 12/14/18 (12/17/18 1500)                   Barriers/Navigation Needs: Education;Coordination of Care (12/17/18 1500) Education: Coping with Diagnosis/ Prognosis;Newly Diagnosed Cancer Education (12/17/18 1500) Interventions: Coordination of Care (12/17/18 1500)   Coordination of Care: Appts (12/17/18 1500)                  Time Spent with Patient: 45 (12/17/18 1500)   Called patient to establish navigation services.  Patient is newly diagnosed with invasive breast cancer.  I have scheduled her to see Dr. Rosana Hoes on 12/20/18 @ 3:00 and Dr. Tasia Catchings on 12/21/18 @ 11:15.  Will give educational material at that time.  She is to call with any questions or needs.

## 2018-12-20 ENCOUNTER — Ambulatory Visit: Payer: Medicare Other | Admitting: Surgery

## 2018-12-20 ENCOUNTER — Other Ambulatory Visit: Payer: Self-pay

## 2018-12-20 ENCOUNTER — Encounter: Payer: Self-pay | Admitting: Surgery

## 2018-12-20 VITALS — BP 168/99 | HR 87 | Temp 97.7°F | Resp 16 | Ht 64.0 in | Wt 207.0 lb

## 2018-12-20 DIAGNOSIS — Z170 Estrogen receptor positive status [ER+]: Secondary | ICD-10-CM | POA: Diagnosis not present

## 2018-12-20 DIAGNOSIS — C50311 Malignant neoplasm of lower-inner quadrant of right female breast: Secondary | ICD-10-CM

## 2018-12-20 NOTE — Progress Notes (Signed)
Surgical Clinic History and Physical  Referring provider:  Arnetha Courser, MD [address] [place], [place] [address]  HISTORY OF PRESENT ILLNESS (HPI):  67 y.o. 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 Patient reports she usually checks screening mammogram in March, this year not until earlier this month (12/4), at which time suspicious mass was visualized, followed by subsequent diagnostic mammogram and focused ultrasound (12/13), and stereotactic core needle biopsy (12/19) with 5 mm invasive adenocarcinoma identified among 7 mm mass. Patient denies prior breast biopsy, abnormal or otherwise, denies nipple discharge, breast pain, palpable breast mass, fever/chills, or unintentional weight loss. She breastfed both of her sons and is aware of 2 maternal aunts with breast cancer and a paternal aunt with breast cancer. Patient otherwise denies CP or SOB, says she is able to walk up and down her flight of steps at home without stopping for or experiencing CP or SOB. She also typically swims a few times per week, though has not since June due to her being "busy". She does not, however, typically walk >2 blocks due to arthritis-associated knee pain.  PAST MEDICAL HISTORY (PMH):  Past Medical History:  Diagnosis Date  . Arthritis   . Osteopenia 11/28/2018  . Pulmonary embolism ([place]) 04/09/2018   Completed course of blood thinner in 2007; after femur fracture and surgery    PAST SURGICAL HISTORY [facility]):  Past Surgical History:  Procedure Laterality Date  . BREAST BIOPSY Right 12/13/2018   stereo bx of mass, coil clip INVASIVE MAMMARY CARCINOMA  . bunionectomy Left   . CERVICAL CONE BIOPSY  1984  . DILATION AND CURETTAGE OF UTERUS  06/07/2018  . HIP PINNING  2007  . HYSTEROSCOPY W/D&C N/A 06/07/2018   Procedure: DILATATION AND CURETTAGE /HYSTEROSCOPY/MYOSURE POLYPECTOMY;  Surgeon: Malachy Mood, MD;  Location:  [facility] ORS;  Service: Gynecology;  Laterality: N/A;  . LEG SURGERY  2007   plate  . TONSILLECTOMY    . VAGINAL DELIVERY     x2    MEDICATIONS:  Prior to Admission medications   Medication Sig Start Date End Date Taking? Authorizing Provider  acetaminophen (TYLENOL) 650 MG CR tablet Take 650 mg by mouth every 8 (eight) hours as needed for pain.   Yes [provider]  aspirin EC 325 MG tablet Take 325 mg by mouth 3 (three) times a week.    Yes [provider]  Calcium-Magnesium-Vitamin D (CALCIUM 1200+D3 PO) Take 1 tablet by mouth 2 (two) times a week.   Yes [provider]  cetirizine (ZYRTEC) 10 MG tablet Take 10 mg by mouth 4 (four) times a week.    Yes [provider]  Cholecalciferol (VITAMIN D3) 1000 units CAPS Take 2,000 Units by mouth 2 (two) times a week.    Yes [provider]  conjugated estrogens (PREMARIN) vaginal cream Place 0.5 Applicatorfuls vaginally 2 (two) times a week. 08/06/18  Yes Malachy Mood, MD  glucosamine-chondroitin 500-400 MG tablet Take 1 tablet by mouth 2 (two) times daily.   Yes [provider]  ibuprofen (ADVIL,MOTRIN) 400 MG tablet Take 1 tablet (400 mg total) by mouth every 6 (six) hours as needed. 06/07/18  Yes Malachy Mood, MD  Multiple Vitamins-Minerals (CENTRUM SILVER PO) Take 1 tablet by mouth daily.   Yes [provider]  Tetrahydrozoline HCl (VISINE OP) Place 2 drops into both eyes daily as needed (for dry eyes).   Yes [provider]  Plain View  PO Take 1 capsule by mouth 3 (three) times a week.   Yes [provider]  vitamin B-12 (CYANOCOBALAMIN) 1000 MCG tablet Take 1,000 mcg by mouth 4 (four) times a week.    Yes [provider]    ALLERGIES:  No Known Allergies   SOCIAL HISTORY:  Social History   Socioeconomic History  . Marital status: Married    Spouse name: Not on file  . Number of children: Not on file  . Years of education: Not on  file  . Highest education level: Not on file  Occupational History  . Not on file  Social Needs  . Financial resource strain: Not on file  . Food insecurity:    Worry: Not on file    Inability: Not on file  . Transportation needs:    Medical: Not on file    Non-medical: Not on file  Tobacco Use  . Smoking status: Never Smoker  . Smokeless tobacco: Never Used  Substance and Sexual Activity  . Alcohol use: No  . Drug use: No  . Sexual activity: Not Currently  Lifestyle  . Physical activity:    Days per week: Not on file    Minutes per session: Not on file  . Stress: Not on file  Relationships  . Social connections:    Talks on phone: Not on file    Gets together: Not on file    Attends religious service: Not on file    Active member of club or organization: Not on file    Attends meetings of clubs or organizations: Not on file    Relationship status: Not on file  . Intimate partner violence:    Fear of current or ex partner: Not on file    Emotionally abused: Not on file    Physically abused: Not on file    Forced sexual activity: Not on file  Other Topics Concern  . Not on file  Social History Narrative  . Not on file    The patient currently resides (home / rehab facility / nursing home): Home The patient normally is (ambulatory / bedbound): Ambulatory  FAMILY HISTORY:  Family History  Problem Relation Age of Onset  . Breast cancer Maternal Aunt 35  . Breast cancer Maternal Aunt 35  . Alzheimer's disease Mother   . Lymphoma Father   . Heart disease Father   . Thyroid disease Son   . Breast cancer Paternal Aunt   . Colon cancer Neg Hx     Otherwise negative/non-contributory.  REVIEW OF SYSTEMS:  Constitutional: denies any other weight loss, fever, chills, or sweats  Eyes: denies any other vision changes, history of eye injury  ENT: denies sore throat, hearing problems  Respiratory: denies shortness of breath, wheezing  Cardiovascular: denies chest pain,  palpitations  Breasts: pain, masses, nipple discharge, and bruising as per HPI Gastrointestinal: denies abdominal pain, N/V, or diarrhea Musculoskeletal: denies any other joint pains or cramps  Skin: Denies any other rashes or skin discolorations except as per HPI Neurological: denies any other headache, dizziness, weakness  Psychiatric: Denies any other depression, anxiety   All other review of systems were otherwise negative   VITAL SIGNS:  BP (!) 168/99   Pulse 87   Temp 97.7 F (36.5 C) (Skin)   Resp 16   Ht _0  (1.626 m)   Wt 207 lb (93.9 kg)   SpO2 96%   BMI 35.53 kg/m   PHYSICAL EXAM:  Constitutional:  -- Overweight  body habitus  -- Awake, alert, and oriented x3  Eyes:  -- Pupils equally round and reactive to light  -- No scleral icterus  Ear, nose, throat:  -- No jugular venous distension -- No nasal drainage, bleeding Pulmonary:  -- No crackles  -- Equal breath sounds bilaterally -- Breathing non-labored at rest Cardiovascular:  -- S1, S2 present  -- No pericardial rubs Breasts: -- Bilateral large, soft, pendulous breasts -- No palpable mass, nipple discharge, or tenderness to palpation bilaterally -- Small focus ecchymosis and subcutaneous hematoma at Right breast biopsy site, no dressing present (patient says she recently removed it 1 week after biopsy as instructed) -- No appreciable axillary lymphadenopathy bilaterally Gastrointestinal:  -- Abdomen soft, nontender, non-distended, no guarding/rebound  -- No abdominal masses appreciated, pulsatile or otherwise  Musculoskeletal and Integumentary:  -- Wounds or skin discoloration: None appreciated except as described above (Breasts) -- Extremities: B/L UE and LE FROM, hands and feet warm, no edema  Neurologic:  -- Motor function: Intact and symmetric -- Sensation: Intact and symmetric  Labs:  CBC Latest Ref Rng & Units 04/09/2018  WBC 3.8 - 10.8 Thousand/uL 5.5  Hemoglobin 11.7 - 15.5 g/dL 13.9   Hematocrit 35.0 - 45.0 % 40.6  Platelets 140 - 400 Thousand/uL 257   CMP Latest Ref Rng & Units 04/09/2018  Glucose 65 - 99 mg/dL 100(H)  BUN 7 - 25 mg/dL 13  Creatinine 0.50 - 0.99 mg/dL 0.79  Sodium 135 - 146 mmol/L 139  Potassium 3.5 - 5.3 mmol/L 4.3  Chloride 98 - 110 mmol/L 102  CO2 20 - 32 mmol/L 29  Calcium 8.6 - 10.4 mg/dL 10.1  Total Protein 6.1 - 8.1 g/dL 7.7  Total Bilirubin 0.2 - 1.2 mg/dL 0.6  AST 10 - 35 U/L 20  ALT 6 - 29 U/L 18   Imaging studies:  Screening B/L Mammogram (11/28/2018) ACR Breast Density Category b: There are scattered areas of fibroglandular density. In the right breast, a possible mass warrants further evaluation. In the left breast, no findings suspicious for malignancy.  Diagnostic Right Mammogram and Focused/Targeted Ultrasound (12/07/2018) Persistent 7 mm mass within the slightly INNER probable LOWER RIGHT breast without sonographic correlate. Given that this is new since 2018 in this postmenopausal patient, tissue sampling is recommended.  Stereotactic Core Needle Biopsy Pathology (12/13/2018) BREAST, RIGHT, LOWER INNER QUADRANT; STEREOTACTIC-GUIDED CORE BIOPSY:  - INVASIVE MAMMARY CARCINOMA, NO SPECIAL TYPE.   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 female with non-palpable biopsy-proven Right breast invasive breast carcinoma identified on annual screening mammogram, complicated by co-morbidities including osteopenia, osteoarthritis, and a history of therapeutic anticoagulation for pulmonary embolus..   - imaging and pathology results discussed  - all risks, benefits, and alternatives to Right breast lumpectomy/partial mastectomy (with wire-localization) + radiation vs mastectomy, either along with sentinel lymph node biopsy, were discussed with the patient and her husband, all of their questions were answered to their expressed satisfaction, patient expresses she wishes to proceed with Right breast lumpectomy, sentinel lymph node biopsy, and post-surgical radiation therapy, and informed consent was obtained accordingly.  - will proceed with Right breast lumpectomy/partial mastectomy with wire localization (mass not appreciable on patient's ultrasound) and sentinel lymph node biopsy as soon as possible pending anesthesia and OR availability   - anticipate return to clinic 2 weeks following above procedure  - instructed to call if any questions or concerns  All of the above recommendations were discussed with the patient and patient's family, and all of patient's and family's questions were answered to their expressed satisfaction.  Thank you for the opportunity to participate in this patient's care.  -- Marilynne Drivers Rosana Hoes, MD, [place]: [facility] - Partnering for exceptional care. Office: [phone]

## 2018-12-20 NOTE — H&P (View-Only) (Signed)
Surgical Clinic History and Physical  Referring provider:  Arnetha Courser, MD [address] [place], [place] [address]  HISTORY OF PRESENT ILLNESS (HPI):  67 y.o. 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 Patient reports she usually checks screening mammogram in March, this year not until earlier this month (12/4), at which time suspicious mass was visualized, followed by subsequent diagnostic mammogram and focused ultrasound (12/13), and stereotactic core needle biopsy (12/19) with 5 mm invasive adenocarcinoma identified among 7 mm mass. Patient denies prior breast biopsy, abnormal or otherwise, denies nipple discharge, breast pain, palpable breast mass, fever/chills, or unintentional weight loss. She breastfed both of her sons and is aware of 2 maternal aunts with breast cancer and a paternal aunt with breast cancer. Patient otherwise denies CP or SOB, says she is able to walk up and down her flight of steps at home without stopping for or experiencing CP or SOB. She also typically swims a few times per week, though has not since June due to her being "busy". She does not, however, typically walk >2 blocks due to arthritis-associated knee pain.  PAST MEDICAL HISTORY (PMH):  Past Medical History:  Diagnosis Date  . Arthritis   . Osteopenia 11/28/2018  . Pulmonary embolism ([place]) 04/09/2018   Completed course of blood thinner in 2007; after femur fracture and surgery    PAST SURGICAL HISTORY [facility]):  Past Surgical History:  Procedure Laterality Date  . BREAST BIOPSY Right 12/13/2018   stereo bx of mass, coil clip INVASIVE MAMMARY CARCINOMA  . bunionectomy Left   . CERVICAL CONE BIOPSY  1984  . DILATION AND CURETTAGE OF UTERUS  06/07/2018  . HIP PINNING  2007  . HYSTEROSCOPY W/D&C N/A 06/07/2018   Procedure: DILATATION AND CURETTAGE /HYSTEROSCOPY/MYOSURE POLYPECTOMY;  Surgeon: Malachy Mood, MD;  Location:  [facility] ORS;  Service: Gynecology;  Laterality: N/A;  . LEG SURGERY  2007   plate  . TONSILLECTOMY    . VAGINAL DELIVERY     x2    MEDICATIONS:  Prior to Admission medications   Medication Sig Start Date End Date Taking? Authorizing Provider  acetaminophen (TYLENOL) 650 MG CR tablet Take 650 mg by mouth every 8 (eight) hours as needed for pain.   Yes [provider]  aspirin EC 325 MG tablet Take 325 mg by mouth 3 (three) times a week.    Yes [provider]  Calcium-Magnesium-Vitamin D (CALCIUM 1200+D3 PO) Take 1 tablet by mouth 2 (two) times a week.   Yes [provider]  cetirizine (ZYRTEC) 10 MG tablet Take 10 mg by mouth 4 (four) times a week.    Yes [provider]  Cholecalciferol (VITAMIN D3) 1000 units CAPS Take 2,000 Units by mouth 2 (two) times a week.    Yes [provider]  conjugated estrogens (PREMARIN) vaginal cream Place 0.5 Applicatorfuls vaginally 2 (two) times a week. 08/06/18  Yes Malachy Mood, MD  glucosamine-chondroitin 500-400 MG tablet Take 1 tablet by mouth 2 (two) times daily.   Yes [provider]  ibuprofen (ADVIL,MOTRIN) 400 MG tablet Take 1 tablet (400 mg total) by mouth every 6 (six) hours as needed. 06/07/18  Yes Malachy Mood, MD  Multiple Vitamins-Minerals (CENTRUM SILVER PO) Take 1 tablet by mouth daily.   Yes [provider]  Tetrahydrozoline HCl (VISINE OP) Place 2 drops into both eyes daily as needed (for dry eyes).   Yes [provider]  Plain View  PO Take 1 capsule by mouth 3 (three) times a week.   Yes [provider]  vitamin B-12 (CYANOCOBALAMIN) 1000 MCG tablet Take 1,000 mcg by mouth 4 (four) times a week.    Yes [provider]    ALLERGIES:  No Known Allergies   SOCIAL HISTORY:  Social History   Socioeconomic History  . Marital status: Married    Spouse name: Not on file  . Number of children: Not on file  . Years of education: Not on  file  . Highest education level: Not on file  Occupational History  . Not on file  Social Needs  . Financial resource strain: Not on file  . Food insecurity:    Worry: Not on file    Inability: Not on file  . Transportation needs:    Medical: Not on file    Non-medical: Not on file  Tobacco Use  . Smoking status: Never Smoker  . Smokeless tobacco: Never Used  Substance and Sexual Activity  . Alcohol use: No  . Drug use: No  . Sexual activity: Not Currently  Lifestyle  . Physical activity:    Days per week: Not on file    Minutes per session: Not on file  . Stress: Not on file  Relationships  . Social connections:    Talks on phone: Not on file    Gets together: Not on file    Attends religious service: Not on file    Active member of club or organization: Not on file    Attends meetings of clubs or organizations: Not on file    Relationship status: Not on file  . Intimate partner violence:    Fear of current or ex partner: Not on file    Emotionally abused: Not on file    Physically abused: Not on file    Forced sexual activity: Not on file  Other Topics Concern  . Not on file  Social History Narrative  . Not on file    The patient currently resides (home / rehab facility / nursing home): Home The patient normally is (ambulatory / bedbound): Ambulatory  FAMILY HISTORY:  Family History  Problem Relation Age of Onset  . Breast cancer Maternal Aunt 35  . Breast cancer Maternal Aunt 35  . Alzheimer's disease Mother   . Lymphoma Father   . Heart disease Father   . Thyroid disease Son   . Breast cancer Paternal Aunt   . Colon cancer Neg Hx     Otherwise negative/non-contributory.  REVIEW OF SYSTEMS:  Constitutional: denies any other weight loss, fever, chills, or sweats  Eyes: denies any other vision changes, history of eye injury  ENT: denies sore throat, hearing problems  Respiratory: denies shortness of breath, wheezing  Cardiovascular: denies chest pain,  palpitations  Breasts: pain, masses, nipple discharge, and bruising as per HPI Gastrointestinal: denies abdominal pain, N/V, or diarrhea Musculoskeletal: denies any other joint pains or cramps  Skin: Denies any other rashes or skin discolorations except as per HPI Neurological: denies any other headache, dizziness, weakness  Psychiatric: Denies any other depression, anxiety   All other review of systems were otherwise negative   VITAL SIGNS:  BP (!) 168/99   Pulse 87   Temp 97.7 F (36.5 C) (Skin)   Resp 16   Ht _0  (1.626 m)   Wt 207 lb (93.9 kg)   SpO2 96%   BMI 35.53 kg/m   PHYSICAL EXAM:  Constitutional:  -- Overweight  body habitus  -- Awake, alert, and oriented x3  Eyes:  -- Pupils equally round and reactive to light  -- No scleral icterus  Ear, nose, throat:  -- No jugular venous distension -- No nasal drainage, bleeding Pulmonary:  -- No crackles  -- Equal breath sounds bilaterally -- Breathing non-labored at rest Cardiovascular:  -- S1, S2 present  -- No pericardial rubs Breasts: -- Bilateral large, soft, pendulous breasts -- No palpable mass, nipple discharge, or tenderness to palpation bilaterally -- Small focus ecchymosis and subcutaneous hematoma at Right breast biopsy site, no dressing present (patient says she recently removed it 1 week after biopsy as instructed) -- No appreciable axillary lymphadenopathy bilaterally Gastrointestinal:  -- Abdomen soft, nontender, non-distended, no guarding/rebound  -- No abdominal masses appreciated, pulsatile or otherwise  Musculoskeletal and Integumentary:  -- Wounds or skin discoloration: None appreciated except as described above (Breasts) -- Extremities: B/L UE and LE FROM, hands and feet warm, no edema  Neurologic:  -- Motor function: Intact and symmetric -- Sensation: Intact and symmetric  Labs:  CBC Latest Ref Rng & Units 04/09/2018  WBC 3.8 - 10.8 Thousand/uL 5.5  Hemoglobin 11.7 - 15.5 g/dL 13.9   Hematocrit 35.0 - 45.0 % 40.6  Platelets 140 - 400 Thousand/uL 257   CMP Latest Ref Rng & Units 04/09/2018  Glucose 65 - 99 mg/dL 100(H)  BUN 7 - 25 mg/dL 13  Creatinine 0.50 - 0.99 mg/dL 0.79  Sodium 135 - 146 mmol/L 139  Potassium 3.5 - 5.3 mmol/L 4.3  Chloride 98 - 110 mmol/L 102  CO2 20 - 32 mmol/L 29  Calcium 8.6 - 10.4 mg/dL 10.1  Total Protein 6.1 - 8.1 g/dL 7.7  Total Bilirubin 0.2 - 1.2 mg/dL 0.6  AST 10 - 35 U/L 20  ALT 6 - 29 U/L 18   Imaging studies:  Screening B/L Mammogram (11/28/2018) ACR Breast Density Category b: There are scattered areas of fibroglandular density. In the right breast, a possible mass warrants further evaluation. In the left breast, no findings suspicious for malignancy.  Diagnostic Right Mammogram and Focused/Targeted Ultrasound (12/07/2018) Persistent 7 mm mass within the slightly INNER probable LOWER RIGHT breast without sonographic correlate. Given that this is new since 2018 in this postmenopausal patient, tissue sampling is recommended.  Stereotactic Core Needle Biopsy Pathology (12/13/2018) BREAST, RIGHT, LOWER INNER QUADRANT; STEREOTACTIC-GUIDED CORE BIOPSY:  - INVASIVE MAMMARY CARCINOMA, NO SPECIAL TYPE.   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 female with non-palpable biopsy-proven Right breast invasive breast carcinoma identified on annual screening mammogram, complicated by co-morbidities including osteopenia, osteoarthritis, and a history of therapeutic anticoagulation for pulmonary embolus..   - imaging and pathology results discussed  - all risks, benefits, and alternatives to Right breast lumpectomy/partial mastectomy (with wire-localization) + radiation vs mastectomy, either along with sentinel lymph node biopsy, were discussed with the patient and her husband, all of their questions were answered to their expressed satisfaction, patient expresses she wishes to proceed with Right breast lumpectomy, sentinel lymph node biopsy, and post-surgical radiation therapy, and informed consent was obtained accordingly.  - will proceed with Right breast lumpectomy/partial mastectomy with wire localization (mass not appreciable on patient's ultrasound) and sentinel lymph node biopsy as soon as possible pending anesthesia and OR availability   - anticipate return to clinic 2 weeks following above procedure  - instructed to call if any questions or concerns  All of the above recommendations were discussed with the patient and patient's family, and all of patient's and family's questions were answered to their expressed satisfaction.  Thank you for the opportunity to participate in this patient's care.  -- Marilynne Drivers Rosana Hoes, MD, [place]: [facility] - [facility] for exceptional care. Office: [phone]

## 2018-12-20 NOTE — Patient Instructions (Addendum)
You are scheduled for surgery at [facility] with Dr Rosana Hoes on 12/31/18.  You will decrease your aspirin to 81 mg daily one week prior to surgery. You will pre admit at the hospital in the [facility] on 12/27/18 at 2:30 pm. You will arrive at the [facility] the morning of surgery on 12/31/18 at 7:45 am. Surgery instructions have been reviewed with the patient.

## 2018-12-21 ENCOUNTER — Inpatient Hospital Stay: Payer: Medicare Other

## 2018-12-21 ENCOUNTER — Encounter: Payer: Self-pay | Admitting: *Deleted

## 2018-12-21 ENCOUNTER — Inpatient Hospital Stay: Payer: Medicare Other | Attending: Oncology | Admitting: Oncology

## 2018-12-21 ENCOUNTER — Encounter: Payer: Self-pay | Admitting: Oncology

## 2018-12-21 ENCOUNTER — Other Ambulatory Visit: Payer: Self-pay

## 2018-12-21 ENCOUNTER — Other Ambulatory Visit: Payer: Self-pay | Admitting: Surgery

## 2018-12-21 VITALS — BP 137/83 | HR 69 | Temp 96.8°F | Resp 18 | Ht 63.0 in | Wt 208.3 lb

## 2018-12-21 DIAGNOSIS — C50911 Malignant neoplasm of unspecified site of right female breast: Secondary | ICD-10-CM

## 2018-12-21 DIAGNOSIS — Z86711 Personal history of pulmonary embolism: Secondary | ICD-10-CM

## 2018-12-21 DIAGNOSIS — M1990 Unspecified osteoarthritis, unspecified site: Secondary | ICD-10-CM | POA: Diagnosis not present

## 2018-12-21 DIAGNOSIS — Z79899 Other long term (current) drug therapy: Secondary | ICD-10-CM | POA: Diagnosis not present

## 2018-12-21 DIAGNOSIS — M85852 Other specified disorders of bone density and structure, left thigh: Secondary | ICD-10-CM

## 2018-12-21 DIAGNOSIS — M85832 Other specified disorders of bone density and structure, left forearm: Secondary | ICD-10-CM

## 2018-12-21 DIAGNOSIS — C50311 Malignant neoplasm of lower-inner quadrant of right female breast: Secondary | ICD-10-CM | POA: Insufficient documentation

## 2018-12-21 DIAGNOSIS — Z7982 Long term (current) use of aspirin: Secondary | ICD-10-CM | POA: Diagnosis not present

## 2018-12-21 DIAGNOSIS — Z803 Family history of malignant neoplasm of breast: Secondary | ICD-10-CM | POA: Insufficient documentation

## 2018-12-21 DIAGNOSIS — Z170 Estrogen receptor positive status [ER+]: Principal | ICD-10-CM

## 2018-12-21 DIAGNOSIS — M8580 Other specified disorders of bone density and structure, unspecified site: Secondary | ICD-10-CM | POA: Diagnosis not present

## 2018-12-21 DIAGNOSIS — Z923 Personal history of irradiation: Secondary | ICD-10-CM

## 2018-12-21 LAB — COMPREHENSIVE METABOLIC PANEL
ALT: 19 U/L (ref 0–44)
AST: 21 U/L (ref 15–41)
Albumin: 4.1 g/dL (ref 3.5–5.0)
Alkaline Phosphatase: 60 U/L (ref 38–126)
Anion gap: 7 (ref 5–15)
BUN: 17 mg/dL (ref 8–23)
CO2: 26 mmol/L (ref 22–32)
Calcium: 9.7 mg/dL (ref 8.9–10.3)
Chloride: 104 mmol/L (ref 98–111)
Creatinine, Ser: 0.67 mg/dL (ref 0.44–1.00)
GFR calc Af Amer: >60 mL/min (ref >60)
GFR calc non Af Amer: >60 mL/min (ref >60)
Glucose, Bld: 102 mg/dL — ABNORMAL HIGH (ref 70–99)
Potassium: 4.4 mmol/L (ref 3.5–5.1)
Sodium: 137 mmol/L (ref 135–145)
Total Bilirubin: 0.5 mg/dL (ref 0.3–1.2)
Total Protein: 7.7 g/dL (ref 6.5–8.1)

## 2018-12-21 LAB — CBC WITH DIFFERENTIAL/PLATELET
Abs Immature Granulocytes: 0.02 10*3/uL (ref 0.00–0.07)
Basophils Absolute: 0.1 10*3/uL (ref 0.0–0.1)
Basophils Relative: 1 %
Eosinophils Absolute: 0.2 10*3/uL (ref 0.0–0.5)
Eosinophils Relative: 3 %
HCT: 41.6 % (ref 36.0–46.0)
Hemoglobin: 13.5 g/dL (ref 12.0–15.0)
Immature Granulocytes: 0 %
Lymphocytes Relative: 28 %
Lymphs Abs: 1.8 10*3/uL (ref 0.7–4.0)
MCH: 27.9 pg (ref 26.0–34.0)
MCHC: 32.5 g/dL (ref 30.0–36.0)
MCV: 86 fL (ref 80.0–100.0)
Monocytes Absolute: 0.4 10*3/uL (ref 0.1–1.0)
Monocytes Relative: 7 %
Neutro Abs: 3.9 10*3/uL (ref 1.7–7.7)
Neutrophils Relative %: 61 %
Platelets: 249 10*3/uL (ref 150–400)
RBC: 4.84 MIL/uL (ref 3.87–5.11)
RDW: 14.7 % (ref 11.5–15.5)
WBC: 6.4 10*3/uL (ref 4.0–10.5)
nRBC: 0 % (ref 0.0–0.2)

## 2018-12-21 NOTE — Progress Notes (Signed)
Patient here for initial visit. °

## 2018-12-21 NOTE — Progress Notes (Signed)
  Oncology Nurse Navigator Documentation  Navigator Location: [facility] (12/21/18 1200) Referral date to RadOnc/MedOnc: 12/21/18 (12/21/18 1200) )Navigator Encounter Type: Initial MedOnc (12/21/18 1200)       Surgery Date: 12/31/18 (12/21/18 1200) Genetic Counseling Date: 12/21/18 (12/21/18 1200) Genetic Counseling Type: Urgent (12/21/18 1200)         Patient Visit Type: MedOnc (12/21/18 1200) Treatment Phase: Pre-Tx/Tx Discussion (12/21/18 1200) Barriers/Navigation Needs: Education (12/21/18 1200) Education: Newly Diagnosed Cancer Education (12/21/18 1200) Interventions: Education (12/21/18 1200)     Education Method: Verbal;Written (12/21/18 1200)  Support Groups/Services: Breast Support Group (12/21/18 1200)             Time Spent with Patient: 60 (12/21/18 1200)   Met patient today during her initial medical oncology consult with Dr. Tasia Catchings.  Patient is scheduled for lumpectomy on 12/31/18.  She will follow up with Dr. Tasia Catchings 1 week post surgery.  She will again discuss possible Oncotype DX testing.  Patient reminded to stop the vaginal estrogen cream as recommended by Dr. Tasia Catchings.  She is to call if she has any questions or needs.

## 2018-12-22 ENCOUNTER — Encounter: Payer: Self-pay | Admitting: Surgery

## 2018-12-22 DIAGNOSIS — Z170 Estrogen receptor positive status [ER+]: Secondary | ICD-10-CM

## 2018-12-22 DIAGNOSIS — C50311 Malignant neoplasm of lower-inner quadrant of right female breast: Secondary | ICD-10-CM | POA: Insufficient documentation

## 2018-12-22 NOTE — Progress Notes (Signed)
Hematology/Oncology Consult note [facility] Telephone:([phone] [phone]   Patient Care Team: Arnetha Courser, MD as PCP - General (Family Medicine) Leanor Kail, MD (Orthopedic Surgery)  REFERRING PROVIDER: Dr.Collins.  CHIEF COMPLAINTS/REASON FOR VISIT:  Evaluation of breast cancer  HISTORY OF PRESENTING ILLNESS:  Stacy Warren is a  67 y.o.  female with PMH listed below who was referred to me for evaluation of breast cancer  Patient had screening mammogram on 11/28/2018 which indicate further evaluation. Diagnostic mammogram and ultrasound on 12/07/2018 showed persistent 7 mm mass within the slightly inner palpable lower right breast without sonographic correlate.  Recommend biopsy. Biopsy pathology showed: Invasive mammary carcinoma, no special type, 5 mm in December.  Grade 2, DCIS present,  ER more than 90% positive, PR more than 90% positive, HER-2 negative.  Nipple discharge: Denies Family history: Strong family history of breast cancer 2 maternal aunt deceased from breast cancer in 69s.  Father deceased from [place].  Paternal aunt deceased from breast  OCP use: Remote use of OCP in her 36s for about a year..  Estrogen and progesterone therapy: denies History of radiation to chest: denies.  Previous breast surgery: Denies Menopause status: Post menopause   #History of pulmonary embolism hip surgery.  Took blood thinner for a period of time.  Currently on aspirin 325 mg daily.  Review of Systems  Constitutional: Negative for appetite change, chills, fatigue and fever.  HENT:   Negative for hearing loss and voice change.   Eyes: Negative for eye problems.  Respiratory: Negative for chest tightness and cough.   Cardiovascular: Negative for chest pain.  Gastrointestinal: Negative for abdominal distention, abdominal pain and blood in stool.  Endocrine: Negative for hot flashes.  Genitourinary: Negative for difficulty urinating  and frequency.   Musculoskeletal: Negative for arthralgias.  Skin: Negative for itching and rash.  Neurological: Negative for extremity weakness.  Hematological: Negative for adenopathy.  Psychiatric/Behavioral: Negative for confusion.    MEDICAL HISTORY:  Past Medical History:  Diagnosis Date  . Arthritis   . Osteopenia 11/28/2018  . Pulmonary embolism ([place]) 04/09/2018   Completed course of blood thinner in 2007; after femur fracture and surgery    SURGICAL HISTORY: Past Surgical History:  Procedure Laterality Date  . BREAST BIOPSY Right 12/13/2018   stereo bx of mass, coil clip INVASIVE MAMMARY CARCINOMA  . bunionectomy Left   . CERVICAL CONE BIOPSY  1984  . DILATION AND CURETTAGE OF UTERUS  06/07/2018  . HIP PINNING  2007  . HYSTEROSCOPY W/D&C N/A 06/07/2018   Procedure: DILATATION AND CURETTAGE /HYSTEROSCOPY/MYOSURE POLYPECTOMY;  Surgeon: Malachy Mood, MD;  Location: [facility] ORS;  Service: Gynecology;  Laterality: N/A;  . LEG SURGERY  2007   plate  . TONSILLECTOMY    . VAGINAL DELIVERY     x2    SOCIAL HISTORY: Social History   Socioeconomic History  . Marital status: Married    Spouse name: Not on file  . Number of children: Not on file  . Years of education: Not on file  . Highest education level: Not on file  Occupational History  . Occupation: retired  Scientific laboratory technician  . Financial resource strain: Not on file  . Food insecurity:    Worry: Not on file    Inability: Not on file  . Transportation needs:    Medical: Not on file    Non-medical: Not on file  Tobacco Use  . Smoking status: Never Smoker  . Smokeless tobacco: Never  Used  Substance and Sexual Activity  . Alcohol use: No  . Drug use: No  . Sexual activity: Not Currently  Lifestyle  . Physical activity:    Days per week: Not on file    Minutes per session: Not on file  . Stress: Not on file  Relationships  . Social connections:    Talks on phone: Not on file    Gets together: Not on file      Attends religious service: Not on file    Active member of club or organization: Not on file    Attends meetings of clubs or organizations: Not on file    Relationship status: Not on file  . Intimate partner violence:    Fear of current or ex partner: Not on file    Emotionally abused: Not on file    Physically abused: Not on file    Forced sexual activity: Not on file  Other Topics Concern  . Not on file  Social History Narrative  . Not on file    FAMILY HISTORY: Family History  Problem Relation Age of Onset  . Breast cancer Maternal Aunt 35  . Breast cancer Maternal Aunt 35  . Alzheimer's disease Mother   . Lymphoma Father   . Heart disease Father   . Skin cancer Brother   . Thyroid disease Son   . Breast cancer Paternal Aunt   . Colon cancer Neg Hx     ALLERGIES:  has No Known Allergies.  MEDICATIONS:  Current Outpatient Medications  Medication Sig Dispense Refill  . acetaminophen (TYLENOL) 650 MG CR tablet Take 650 mg by mouth every 8 (eight) hours as needed for pain.    Marland Kitchen aspirin EC 325 MG tablet Take 325 mg by mouth 2 (two) times a week.     . Calcium-Magnesium-Vitamin D (CALCIUM 1200+D3 PO) Take 1 tablet by mouth 2 (two) times a week.    . cetirizine (ZYRTEC) 10 MG tablet Take 10 mg by mouth daily as needed for allergies.     . Cholecalciferol (VITAMIN D3) 1000 units CAPS Take 2,000 Units by mouth 2 (two) times a week.     . conjugated estrogens (PREMARIN) vaginal cream Place 0.5 Applicatorfuls vaginally 2 (two) times a week. 30 g 3  . glucosamine-chondroitin 500-400 MG tablet Take 1 tablet by mouth daily.     Marland Kitchen ibuprofen (ADVIL,MOTRIN) 400 MG tablet Take 1 tablet (400 mg total) by mouth every 6 (six) hours as needed. 30 tablet 0  . Multiple Vitamins-Minerals (CENTRUM SILVER PO) Take 1 tablet by mouth daily.    . Tetrahydrozoline HCl (VISINE OP) Place 2 drops into both eyes daily as needed (for dry eyes).    . TURMERIC CURCUMIN PO Take 500 mg by mouth at  bedtime.     . vitamin B-12 (CYANOCOBALAMIN) 1000 MCG tablet Take 1,000 mcg by mouth 4 (four) times a week.      No current facility-administered medications for this visit.      PHYSICAL EXAMINATION: ECOG PERFORMANCE STATUS: 0 - Asymptomatic Vitals:   12/21/18 1122  BP: 137/83  Pulse: 69  Resp: 18  Temp: (!) 96.8 F (36 C)   Filed Weights   12/21/18 1122  Weight: 208 lb 4.8 oz (94.5 kg)    Physical Exam Constitutional:      General: She is not in acute distress. HENT:     Head: Normocephalic and atraumatic.  Eyes:     General: No scleral icterus.  Pupils: Pupils are equal, round, and reactive to light.  Neck:     Musculoskeletal: Normal range of motion and neck supple.  Cardiovascular:     Rate and Rhythm: Normal rate and regular rhythm.     Heart sounds: Normal heart sounds.  Pulmonary:     Effort: Pulmonary effort is normal. No respiratory distress.     Breath sounds: No wheezing.  Abdominal:     General: Bowel sounds are normal. There is no distension.     Palpations: Abdomen is soft. There is no mass.     Tenderness: There is no abdominal tenderness.  Musculoskeletal: Normal range of motion.        General: No deformity.  Skin:    General: Skin is warm and dry.     Findings: No erythema or rash.  Neurological:     Mental Status: She is alert and oriented to person, place, and time.     Cranial Nerves: No cranial nerve deficit.     Coordination: Coordination normal.  Psychiatric:        Behavior: Behavior normal.        Thought Content: Thought content normal.   Breast, no palpable breast mass bilaterally.  No palpable axillary lymphadenopathy.   LABORATORY DATA:  I have reviewed the data as listed Lab Results  Component Value Date   WBC 6.4 12/21/2018   HGB 13.5 12/21/2018   HCT 41.6 12/21/2018   MCV 86.0 12/21/2018   PLT 249 12/21/2018   Recent Labs    04/09/18 1048 12/21/18 1223  NA 139 137  K 4.3 4.4  CL 102 104  CO2 29 26  GLUCOSE  100* 102*  BUN 13 17  CREATININE 0.79 0.67  CALCIUM 10.1 9.7  GFRNONAA 77 >60  GFRAA 89 >60  PROT 7.7 7.7  ALBUMIN  --  4.1  AST 20 21  ALT 18 19  ALKPHOS  --  60  BILITOT 0.6 0.5   Iron/TIBC/Ferritin/ %Sat No results found for: IRON, TIBC, FERRITIN, IRONPCTSAT    RADIOGRAPHIC STUDIES: I have personally reviewed the radiological images as listed and agreed with the findings in the report. 12/07/2018 diagnostic mammogram and ultrasound 2D/3D spot compression views of the RIGHT breast demonstrate a persistent 7 mm partially circumscribed partially obscured oval mass within the slightly INNER probably LOWER RIGHT breast.  Targeted ultrasound is performed, showing no solid or cystic mass, distortion or abnormal shadowing within the entire LOWER and central RIGHT breast. Heterogeneous tissue throughout the RIGHT breast noted.  IMPRESSION: 1. Persistent 7 mm mass within the slightly INNER probable LOWER RIGHT breast without sonographic correlate. Given that this is new since 2018 in this postmenopausal patient, tissue sampling is recommended.  DG bone density 11/28/2018 The BMD measured at Forearm Radius 33% is 0.679 g/cm2 with a T-score of -2.3. This patient is considered osteopenic according to [facility]) criteria.   ASSESSMENT & PLAN:  1. Malignant neoplasm of lower-inner quadrant of right breast of female, estrogen receptor positive ([place])   2. Family history of breast cancer   3. History of pulmonary embolism   4. Osteopenia of left forearm   5. Osteopenia of neck of left femur    Mammogram and ultrasound images were independently reviewed by me and discussed with patient.  Pathology results were discussed with patient. Clinically T1bN0, ER PR positive HER-2 negative invasive mammary carcinoma. Recommend surgical resection with lumpectomy and sentinel lymph node biopsy. Depending on the final pathology, will decide if  need to send Oncotype DX We briefly  discussed about adjuvant radiation and antiestrogen treatment. Check baseline CBC and CMP #Strong family history of breast cancer, recommend genetic testing.  Refer to Dietitian.  #Osteopenia, patient will need anti-estrogen treatments after finishing radiation. Candidate for bisphosphonate treatments.  Briefly discussed.  will discuss during further visits.  Patient has multiple questions and all questions answered to her satisfaction. We spent sufficient time to discuss many aspect of care, questions were answered to patient's satisfaction.   Orders Placed This Encounter  Procedures  . CBC with Differential/Platelet    Standing Status:   Future    Number of Occurrences:   1    Standing Expiration Date:   12/22/2019  . Comprehensive metabolic panel    Standing Status:   Future    Number of Occurrences:   1    Standing Expiration Date:   12/22/2019    All questions were answered. The patient knows to call the clinic with any problems questions or concerns.  Return of visit: 1 week after surgery. Thank you for this kind referral and the opportunity to participate in the care of this patient. A copy of today's note is routed to referring provider  Total face to face encounter time for this patient visit was  60 min. >50% of the time was  spent in counseling and coordination of care.    Earlie Server, MD, PhD Hematology Oncology [facility] at [facility] Pager- [phone] 12/22/2018

## 2018-12-25 NOTE — Addendum Note (Signed)
Encounter addended by: Nona Dell, RT on: 12/25/2018 2:04 PM  Actions taken: Imaging Exam begun

## 2018-12-27 ENCOUNTER — Other Ambulatory Visit: Payer: Self-pay

## 2018-12-27 ENCOUNTER — Encounter
Admission: RE | Admit: 2018-12-27 | Discharge: 2018-12-27 | Disposition: A | Discharge status: Home / Self Care | Payer: Medicare Other | Source: Ambulatory Visit | Attending: Surgery | Admitting: Surgery

## 2018-12-27 ENCOUNTER — Telehealth: Payer: Self-pay

## 2018-12-27 DIAGNOSIS — Z01818 Encounter for other preprocedural examination: Secondary | ICD-10-CM | POA: Diagnosis not present

## 2018-12-27 DIAGNOSIS — Z01810 Encounter for preprocedural cardiovascular examination: Secondary | ICD-10-CM

## 2018-12-27 NOTE — Patient Instructions (Signed)
Your procedure is scheduled on: Mon 12/31/18 Report to Mammography. At 8:00  Remember: Instructions that are not followed completely may result in serious medical risk,  up to and including death, or upon the discretion of your surgeon and anesthesiologist your  surgery may need to be rescheduled.     _X__ 1. Do not eat food after midnight the night before your procedure.                 No gum chewing or hard candies. You may drink clear liquids up to 2 hours                 before you are scheduled to arrive for your surgery- DO not drink clear                 liquids within 2 hours of the start of your surgery.                 Clear Liquids include:  water, apple juice without pulp, clear carbohydrate                 drink such as Clearfast of Gatorade, Black Coffee or Tea (Do not add                 anything to coffee or tea).  __X__2.  On the morning of surgery brush your teeth with toothpaste and water, you                may rinse your mouth with mouthwash if you wish.  Do not swallow any toothpaste of mouthwash.     _X__ 3.  No Alcohol for 24 hours before or after surgery.   ___ 4.  Do Not Smoke or use e-cigarettes For 24 Hours Prior to Your Surgery.                 Do not use any chewable tobacco products for at least 6 hours prior to                 surgery.  ____  5.  Bring all medications with you on the day of surgery if instructed.   __x__  6.  Notify your doctor if there is any change in your medical condition      (cold, fever, infections).     Do not wear jewelry, make-up, hairpins, clips or nail polish. Do not wear lotions, powders, or perfumes. You may wear deodorant. Do not shave 48 hours prior to surgery. Men may shave face and neck. Do not bring valuables to the hospital.    [facility] is not responsible for any belongings or valuables.  Contacts, dentures or bridgework may not be worn into surgery. Leave your suitcase in the car. After  surgery it may be brought to your room. For patients admitted to the hospital, discharge time is determined by your treatment team.   Patients discharged the day of surgery will not be allowed to drive home.   Please read over the following fact sheets that you were given:    __x__ Take these medicines the morning of surgery with A SIP OF WATER:    1. none  2.   3.   4.  5.  6.  ____ Fleet Enema (as directed)   _x___ Use CHG Soap as directed  ____ Use inhalers on the day of surgery  ____ Stop metformin 2 days prior to surgery    ____ Take 1/2  of usual insulin dose the night before surgery. No insulin the morning          of surgery.   __x__  Continue aspirin 81 mg   _x__ Stop Anti-inflammatories on    __x__ Stop supplements until after surgery.  glucosamine-chondroitin 500-400 MG tablet,TURMERIC CURCUMIN PO  ____ Bring C-Pap to the hospital.    1 part rubbing alcohol 2 parts water in snack bag and put in freeze.

## 2018-12-27 NOTE — Telephone Encounter (Signed)
Pt called office today and stated she is having a lumpectomy on Monday 1/6. Her Oncologist has told her to stop the Premarin AMS prescribed. Pt wanting to know if there is something else he can give her?  Pt aware AMS is out of the office until Monday and will review her message and advise next week.

## 2018-12-31 ENCOUNTER — Ambulatory Visit
Admission: RE | Admit: 2018-12-31 | Discharge: 2018-12-31 | Disposition: A | Discharge status: Home / Self Care | Payer: Medicare Other | Attending: Surgery | Admitting: Surgery

## 2018-12-31 ENCOUNTER — Encounter: Payer: Self-pay | Admitting: *Deleted

## 2018-12-31 ENCOUNTER — Ambulatory Visit
Admission: RE | Admit: 2018-12-31 | Discharge: 2018-12-31 | Disposition: A | Discharge status: Home / Self Care | Payer: Medicare Other | Source: Ambulatory Visit | Attending: Surgery | Admitting: Surgery

## 2018-12-31 ENCOUNTER — Ambulatory Visit: Payer: Medicare Other | Admitting: Anesthesiology

## 2018-12-31 ENCOUNTER — Other Ambulatory Visit: Payer: Self-pay

## 2018-12-31 ENCOUNTER — Other Ambulatory Visit: Payer: Self-pay | Admitting: Obstetrics and Gynecology

## 2018-12-31 ENCOUNTER — Encounter
Admission: RE | Disposition: A | Discharge status: Home / Self Care | Payer: Self-pay | Source: Home / Self Care | Attending: Surgery

## 2018-12-31 DIAGNOSIS — Z791 Long term (current) use of non-steroidal anti-inflammatories (NSAID): Secondary | ICD-10-CM | POA: Insufficient documentation

## 2018-12-31 DIAGNOSIS — Z170 Estrogen receptor positive status [ER+]: Principal | ICD-10-CM

## 2018-12-31 DIAGNOSIS — Z7982 Long term (current) use of aspirin: Secondary | ICD-10-CM | POA: Insufficient documentation

## 2018-12-31 DIAGNOSIS — M1990 Unspecified osteoarthritis, unspecified site: Secondary | ICD-10-CM | POA: Insufficient documentation

## 2018-12-31 DIAGNOSIS — C50311 Malignant neoplasm of lower-inner quadrant of right female breast: Secondary | ICD-10-CM

## 2018-12-31 DIAGNOSIS — M8580 Other specified disorders of bone density and structure, unspecified site: Secondary | ICD-10-CM | POA: Insufficient documentation

## 2018-12-31 DIAGNOSIS — Z79899 Other long term (current) drug therapy: Secondary | ICD-10-CM | POA: Insufficient documentation

## 2018-12-31 DIAGNOSIS — Z86711 Personal history of pulmonary embolism: Secondary | ICD-10-CM | POA: Insufficient documentation

## 2018-12-31 DIAGNOSIS — Z803 Family history of malignant neoplasm of breast: Secondary | ICD-10-CM | POA: Diagnosis not present

## 2018-12-31 DIAGNOSIS — D241 Benign neoplasm of right breast: Secondary | ICD-10-CM | POA: Insufficient documentation

## 2018-12-31 HISTORY — PX: BREAST LUMPECTOMY: SHX2

## 2018-12-31 HISTORY — PX: BREAST LUMPECTOMY WITH NEEDLE LOCALIZATION AND AXILLARY SENTINEL LYMPH NODE BX: SHX5760

## 2018-12-31 SURGERY — BREAST LUMPECTOMY WITH NEEDLE LOCALIZATION AND AXILLARY SENTINEL LYMPH NODE BX
Anesthesia: General | Laterality: Right

## 2018-12-31 MED ORDER — FENTANYL CITRATE (PF) 100 MCG/2ML IJ SOLN
INTRAMUSCULAR | Status: AC
  Filled 2018-12-31: qty 2

## 2018-12-31 MED ORDER — PRASTERONE 6.5 MG VA INST: 6.5000 mg | VAGINAL_INSERT | Freq: Every day | VAGINAL | 11 refills | Status: DC

## 2018-12-31 MED ORDER — OXYCODONE-ACETAMINOPHEN 5-325 MG PO TABS
ORAL_TABLET | ORAL | Status: AC
  Filled 2018-12-31: qty 1

## 2018-12-31 MED ORDER — FENTANYL CITRATE (PF) 100 MCG/2ML IJ SOLN: 25.0000 ug | INTRAMUSCULAR | Status: DC | PRN

## 2018-12-31 MED ORDER — GLYCOPYRROLATE 0.2 MG/ML IJ SOLN
INTRAMUSCULAR | Status: AC
  Filled 2018-12-31: qty 1

## 2018-12-31 MED ORDER — LIDOCAINE 2% (20 MG/ML) 5 ML SYRINGE
INTRAMUSCULAR | Status: DC | PRN
  Administered 2018-12-31: 80 mg via INTRAVENOUS

## 2018-12-31 MED ORDER — CHLORHEXIDINE GLUCONATE CLOTH 2 % EX PADS: 6.0000 | MEDICATED_PAD | Freq: Once | CUTANEOUS | Status: DC

## 2018-12-31 MED ORDER — TECHNETIUM TC 99M SULFUR COLLOID FILTERED
0.7730 | Freq: Once | INTRAVENOUS | Status: AC | PRN
  Administered 2018-12-31: 0.773 via INTRADERMAL

## 2018-12-31 MED ORDER — GABAPENTIN 300 MG PO CAPS
ORAL_CAPSULE | ORAL | Status: AC
  Filled 2018-12-31: qty 1

## 2018-12-31 MED ORDER — ISOSULFAN BLUE 1 % ~~LOC~~ SOLN
SUBCUTANEOUS | Status: AC
  Filled 2018-12-31: qty 5

## 2018-12-31 MED ORDER — GLYCOPYRROLATE 0.2 MG/ML IJ SOLN
INTRAMUSCULAR | Status: DC | PRN
  Administered 2018-12-31: 0.2 mg via INTRAVENOUS

## 2018-12-31 MED ORDER — MIDAZOLAM HCL 2 MG/2ML IJ SOLN
INTRAMUSCULAR | Status: AC
  Filled 2018-12-31: qty 2

## 2018-12-31 MED ORDER — ONDANSETRON HCL 4 MG/2ML IJ SOLN
INTRAMUSCULAR | Status: DC | PRN
  Administered 2018-12-31: 4 mg via INTRAVENOUS

## 2018-12-31 MED ORDER — ACETAMINOPHEN 500 MG PO TABS
1000.0000 mg | ORAL_TABLET | ORAL | Status: AC
  Administered 2018-12-31: 1000 mg via ORAL

## 2018-12-31 MED ORDER — CEFAZOLIN SODIUM-DEXTROSE 2-4 GM/100ML-% IV SOLN
INTRAVENOUS | Status: AC
  Filled 2018-12-31: qty 100

## 2018-12-31 MED ORDER — ONDANSETRON HCL 4 MG/2ML IJ SOLN: 4.0000 mg | Freq: Once | INTRAMUSCULAR | Status: DC | PRN

## 2018-12-31 MED ORDER — DEXAMETHASONE SODIUM PHOSPHATE 10 MG/ML IJ SOLN
INTRAMUSCULAR | Status: DC | PRN
  Administered 2018-12-31: 10 mg via INTRAVENOUS

## 2018-12-31 MED ORDER — FAMOTIDINE 20 MG PO TABS
20.0000 mg | ORAL_TABLET | Freq: Once | ORAL | Status: AC
  Administered 2018-12-31: 20 mg via ORAL

## 2018-12-31 MED ORDER — ACETAMINOPHEN 500 MG PO TABS
ORAL_TABLET | ORAL | Status: AC
  Filled 2018-12-31: qty 2

## 2018-12-31 MED ORDER — LIDOCAINE-EPINEPHRINE 1 %-1:100000 IJ SOLN
INTRAMUSCULAR | Status: AC
  Filled 2018-12-31: qty 1

## 2018-12-31 MED ORDER — PHENYLEPHRINE HCL 10 MG/ML IJ SOLN
INTRAMUSCULAR | Status: DC | PRN
  Administered 2018-12-31 (×3): 50 ug via INTRAVENOUS
  Administered 2018-12-31 (×2): 100 ug via INTRAVENOUS
  Administered 2018-12-31 (×2): 50 ug via INTRAVENOUS

## 2018-12-31 MED ORDER — CEFAZOLIN SODIUM-DEXTROSE 2-4 GM/100ML-% IV SOLN
2.0000 g | INTRAVENOUS | Status: AC
  Administered 2018-12-31: 2 g via INTRAVENOUS

## 2018-12-31 MED ORDER — PROPOFOL 10 MG/ML IV BOLUS
INTRAVENOUS | Status: DC | PRN
  Administered 2018-12-31: 150 mg via INTRAVENOUS

## 2018-12-31 MED ORDER — OXYCODONE-ACETAMINOPHEN 5-325 MG PO TABS
1.0000 | ORAL_TABLET | ORAL | Status: DC | PRN
  Administered 2018-12-31: 1 via ORAL

## 2018-12-31 MED ORDER — DEXAMETHASONE SODIUM PHOSPHATE 10 MG/ML IJ SOLN
INTRAMUSCULAR | Status: AC
  Filled 2018-12-31: qty 1

## 2018-12-31 MED ORDER — OXYCODONE-ACETAMINOPHEN 5-325 MG PO TABS: 1.0000 | ORAL_TABLET | ORAL | 0 refills | Status: DC | PRN

## 2018-12-31 MED ORDER — MIDAZOLAM HCL 5 MG/5ML IJ SOLN
INTRAMUSCULAR | Status: DC | PRN
  Administered 2018-12-31: 2 mg via INTRAVENOUS

## 2018-12-31 MED ORDER — GABAPENTIN 300 MG PO CAPS
300.0000 mg | ORAL_CAPSULE | ORAL | Status: AC
  Administered 2018-12-31: 300 mg via ORAL

## 2018-12-31 MED ORDER — BUPIVACAINE HCL (PF) 0.5 % IJ SOLN
INTRAMUSCULAR | Status: AC
  Filled 2018-12-31: qty 30

## 2018-12-31 MED ORDER — ONDANSETRON HCL 4 MG/2ML IJ SOLN
INTRAMUSCULAR | Status: AC
  Filled 2018-12-31: qty 2

## 2018-12-31 MED ORDER — LACTATED RINGERS IV SOLN
INTRAVENOUS | Status: DC
  Administered 2018-12-31: 12:00:00 via INTRAVENOUS

## 2018-12-31 MED ORDER — LIDOCAINE HCL (PF) 2 % IJ SOLN
INTRAMUSCULAR | Status: AC
  Filled 2018-12-31: qty 10

## 2018-12-31 MED ORDER — FENTANYL CITRATE (PF) 100 MCG/2ML IJ SOLN
INTRAMUSCULAR | Status: DC | PRN
  Administered 2018-12-31 (×2): 50 ug via INTRAVENOUS

## 2018-12-31 MED ORDER — DEXMEDETOMIDINE HCL 200 MCG/2ML IV SOLN
INTRAVENOUS | Status: DC | PRN
  Administered 2018-12-31: 8 ug via INTRAVENOUS

## 2018-12-31 MED ORDER — EPHEDRINE SULFATE 50 MG/ML IJ SOLN
INTRAMUSCULAR | Status: DC | PRN
  Administered 2018-12-31: 10 mg via INTRAVENOUS
  Administered 2018-12-31: 5 mg via INTRAVENOUS

## 2018-12-31 MED ORDER — PROPOFOL 500 MG/50ML IV EMUL
INTRAVENOUS | Status: AC
  Filled 2018-12-31: qty 50

## 2018-12-31 MED ORDER — FAMOTIDINE 20 MG PO TABS
ORAL_TABLET | ORAL | Status: AC
  Filled 2018-12-31: qty 1

## 2018-12-31 SURGICAL SUPPLY — 38 items
BLADE SURG 15 STRL LF DISP TIS (BLADE) ×1 IMPLANT
BLADE SURG 15 STRL SS (BLADE) ×2
CANISTER SUCT 1200ML W/VALVE (MISCELLANEOUS) ×3 IMPLANT
CHLORAPREP W/TINT 26ML (MISCELLANEOUS) ×3 IMPLANT
CNTNR SPEC 2.5X3XGRAD LEK (MISCELLANEOUS) ×3
CONT SPEC 4OZ STER OR WHT (MISCELLANEOUS) ×6
CONTAINER SPEC 2.5X3XGRAD LEK (MISCELLANEOUS) ×3 IMPLANT
COVER PROBE FLX POLY STRL (MISCELLANEOUS) ×3 IMPLANT
COVER WAND RF STERILE (DRAPES) ×3 IMPLANT
DERMABOND ADVANCED (GAUZE/BANDAGES/DRESSINGS) ×2
DERMABOND ADVANCED .7 DNX12 (GAUZE/BANDAGES/DRESSINGS) ×1 IMPLANT
DEVICE DUBIN SPECIMEN MAMMOGRA (MISCELLANEOUS) ×3 IMPLANT
DRAPE LAPAROTOMY TRNSV 106X77 (MISCELLANEOUS) ×3 IMPLANT
DRAPE SHEET LG 3/4 BI-LAMINATE (DRAPES) ×3 IMPLANT
ELECT CAUTERY BLADE 6.4 (BLADE) ×3 IMPLANT
ELECT REM PT RETURN 9FT ADLT (ELECTROSURGICAL) ×3
ELECTRODE REM PT RTRN 9FT ADLT (ELECTROSURGICAL) ×1 IMPLANT
GLOVE BIO SURGEON STRL SZ7 (GLOVE) ×3 IMPLANT
GLOVE INDICATOR 7.5 STRL GRN (GLOVE) ×3 IMPLANT
GOWN STRL REUS W/ TWL LRG LVL3 (GOWN DISPOSABLE) ×2 IMPLANT
GOWN STRL REUS W/TWL LRG LVL3 (GOWN DISPOSABLE) ×4
KIT TURNOVER KIT A (KITS) ×3 IMPLANT
LABEL OR SOLS (LABEL) ×3 IMPLANT
NDL HYPO 25X1 1.5 SAFETY (NEEDLE) ×2 IMPLANT
NDL SAFETY ECLIPSE 18X1.5 (NEEDLE) ×1 IMPLANT
NEEDLE HYPO 18GX1.5 SHARP (NEEDLE) ×2
NEEDLE HYPO 25X1 1.5 SAFETY (NEEDLE) ×6 IMPLANT
PACK BASIN MINOR ARMC (MISCELLANEOUS) ×3 IMPLANT
SLEVE PROBE SENORX GAMMA FIND (MISCELLANEOUS) ×3 IMPLANT
SUT MNCRL 4-0 (SUTURE) ×2
SUT MNCRL 4-0 27XMFL (SUTURE) ×1
SUT SILK 2 0 SH (SUTURE) ×3 IMPLANT
SUT VIC AB 3-0 SH 27 (SUTURE) ×2
SUT VIC AB 3-0 SH 27X BRD (SUTURE) ×1 IMPLANT
SUTURE MNCRL 4-0 27XMF (SUTURE) ×1 IMPLANT
SYR 10ML LL (SYRINGE) ×6 IMPLANT
SYR BULB 3OZ (MISCELLANEOUS) ×3 IMPLANT
WATER STERILE IRR 1000ML POUR (IV SOLUTION) ×3 IMPLANT

## 2018-12-31 NOTE — Anesthesia Procedure Notes (Signed)
Procedure Name: LMA Insertion Date/Time: 12/31/2018 12:25 PM Performed by: Marsh Dolly, CRNA Pre-anesthesia Checklist: Patient identified, Patient being monitored, Timeout performed, Emergency Drugs available and Suction available Patient Re-evaluated:Patient Re-evaluated prior to induction Oxygen Delivery Method: Circle system utilized Preoxygenation: Pre-oxygenation with 100% oxygen Induction Type: IV induction Ventilation: Mask ventilation without difficulty LMA: LMA inserted LMA Size: 4.0 Tube type: Oral Number of attempts: 1 Placement Confirmation: positive ETCO2 and breath sounds checked- equal and bilateral Tube secured with: Tape Dental Injury: Teeth and Oropharynx as per pre-operative assessment

## 2018-12-31 NOTE — Telephone Encounter (Signed)
Pt aware via voicemail 

## 2018-12-31 NOTE — Discharge Instructions (Addendum)
In addition to included general post-operative instructions for Right breast lumpectomy with axillary sentinel lymph node biopsy,  Diet: Resume home heart healthy diet.   Activity: No heavy lifting >15 - 20 pounds (children, pets, laundry, garbage) or strenuous activity until follow-up, but light activity and walking are encouraged. Do not drive or drink alcohol if taking narcotic pain medications.  Wound care: 2 days after surgery (Wednesday, 1/8), you may shower/get incision wet with soapy water and pat dry (do not rub incisions), but no baths or submerging incision underwater until follow-up.   Medications: Resume all home medications. For mild to moderate pain: acetaminophen (Tylenol) or ibuprofen/naproxen (if no kidney disease). Combining Tylenol with alcohol can substantially increase your risk of causing liver disease. Narcotic pain medications, if prescribed, can be used for severe pain, though may cause nausea, constipation, and drowsiness. Do not combine Tylenol and Percocet (or similar) within a 6 hour period as Percocet (and similar) contain(s) Tylenol. If you do not need the narcotic pain medication, you do not need to fill the prescription.  Call office [phone]) at any time if any questions, worsening pain, fevers/chills, bleeding, drainage from incision site, or other concerns.  AMBULATORY SURGERY  DISCHARGE INSTRUCTIONS   1) The drugs that you were given will stay in your system until tomorrow so for the next 24 hours you should not:  A) Drive an automobile B) Make any legal decisions C) Drink any alcoholic beverage   2) You may resume regular meals tomorrow.  Today it is better to start with liquids and gradually work up to solid foods.  You may eat anything you prefer, but it is better to start with liquids, then soup and crackers, and gradually work up to solid foods.   3) Please notify your doctor immediately if you have any unusual bleeding, trouble breathing,  redness and pain at the surgery site, drainage, fever, or pain not relieved by medication.    4) Additional Instructions:    Please contact your physician with any problems or Same Day Surgery at [phone], Monday through Friday 6 am to 4 pm, or [place] at [facility] number at [phone].

## 2018-12-31 NOTE — Telephone Encounter (Signed)
Called in some intrarosa which has not estrogen in it

## 2018-12-31 NOTE — Anesthesia Post-op Follow-up Note (Signed)
Anesthesia QCDR form completed.        

## 2018-12-31 NOTE — Transfer of Care (Signed)
Immediate Anesthesia Transfer of Care Note  Patient: Stacy Warren  Procedure(s) Performed: BREAST LUMPECTOMY WITH SENTINEL LYMPH NODE BX, WIRE LOCALIZATION (Right )  Patient Location: PACU  Anesthesia Type:General  Level of Consciousness: awake, alert  and oriented  Airway & Oxygen Therapy: Patient Spontanous Breathing and Patient connected to face mask oxygen  Post-op Assessment: Report given to RN and Post -op Vital signs reviewed and stable  Post vital signs: Reviewed and stable  Last Vitals:  Vitals Value Taken Time  BP 115/78 12/31/2018  2:55 PM  Temp    Pulse 68 12/31/2018  2:55 PM  Resp    SpO2 96 % 12/31/2018  2:55 PM  Vitals shown include unvalidated device data.  Last Pain:  Vitals:   12/31/18 0922  TempSrc: Oral  PainSc: 0-No pain         Complications: No apparent anesthesia complications

## 2018-12-31 NOTE — OR Nursing (Signed)
Up to BR. Voided QS.  Blue urine.  Upon return to room. Vomited about 100ccc.  Peppermint oil on cotton ball for nausea.  Healing touch treatment for nausea.   States feels better. " I want to go home."  IV removed.  No further vomited.

## 2018-12-31 NOTE — Anesthesia Preprocedure Evaluation (Signed)
Anesthesia Evaluation  Patient identified by MRN, date of birth, ID band Patient awake    Reviewed: Allergy & Precautions, H&P , NPO status , Patient's Chart, lab work & pertinent test results, reviewed documented beta blocker date and time   Airway Mallampati: II  TM Distance: >3 FB Neck ROM: full    Dental  (+) Teeth Intact   Pulmonary neg pulmonary ROS,    Pulmonary exam normal        Cardiovascular Exercise Tolerance: Good negative cardio ROS Normal cardiovascular exam Rate:Normal     Neuro/Psych negative neurological ROS  negative psych ROS   GI/Hepatic negative GI ROS, Neg liver ROS,   Endo/Other  negative endocrine ROS  Renal/GU CRFRenal disease  negative genitourinary   Musculoskeletal   Abdominal   Peds  Hematology negative hematology ROS (+)   Anesthesia Other Findings   Reproductive/Obstetrics negative OB ROS                             Anesthesia Physical Anesthesia Plan  ASA: III  Anesthesia Plan: General LMA   Post-op Pain Management:    Induction:   PONV Risk Score and Plan:   Airway Management Planned:   Additional Equipment:   Intra-op Plan:   Post-operative Plan:   Informed Consent: I have reviewed the patients History and Physical, chart, labs and discussed the procedure including the risks, benefits and alternatives for the proposed anesthesia with the patient or authorized representative who has indicated his/her understanding and acceptance.     Plan Discussed with: CRNA  Anesthesia Plan Comments:         Anesthesia Quick Evaluation

## 2018-12-31 NOTE — Op Note (Signed)
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 (icd-10: C50.311)  PROCEDURE(S):  1.) Right partial mastectomy/lumpectomy with image-guided wire localization (cpt: 19301) 2.) Excisional biopsy of Right axillary deep sentinel lymph nodes using radiolabel/tracer and lymphazurine blue dye (cpt: 41324)  INTRAOPERATIVE FINDINGS: Wire-localized mammary tissue removed in its entirety (no palpable mass appreciated) not including biopsy-associated coil clip due to pre-localization migration of marker 4.5 cm laterally per radiology, localization wire confirmed central within specimen intra-operatively by radiology, maximum radiotracer count in 1st specimen: 120 with +blue dye, maximum radiotracer background count 7  INTRAVENOUS FLUIDS: 1000 mL crystalloid   ESTIMATED BLOOD LOSS: Minimal (<20 mL)  URINE OUTPUT: No Foley   SPECIMENS: Right breast lumpectomy/partial mastectomy and deep Right axillary sentinel lymph node  IMPLANTS: None  DRAINS: None  COMPLICATIONS: None apparent  CONDITION AT END OF PROCEDURE: Hemodynamically stable and extubated  DISPOSITION OF PATIENT: PACU  INDICATIONS FOR PROCEDURE:  68 y.o. femalepresented for evaluation of 68 y.o. 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 Patient reports she usually checks screening mammogram in March, this year not until earlier this month (12/4), at which time suspicious mass was visualized, followed by subsequent diagnostic mammogram and focused ultrasound (12/13), and stereotactic core needle biopsy (12/19) with 5 mm invasive  adenocarcinoma identified among 7 mm mass. All risks, benefits, and alternatives to lumpectomy vs mastectomy were discussed with the patient, all of patient's questions were answered to her expressed satisfaction, patient elected to proceed with lumpectomy/partial mastectomy with excisional biopsy of sentinel lymph node(s), and informed consent was accordingly obtained and documented at that time.  DETAILS OF PROCEDURE: Pre-operative needle-/wire-localization was performed by radiology, and localization studies were reviewed. Patient was brought to the operating suite and appropriately identified. General anesthesia was administered along with appropriate pre-operative antibiotics, and endotracheal intubation was performed by anesthetist. In supine position, operative site was prepped and draped in the usual sterile fashion, and following a brief time out, no Right axillary radiotracer counts were able to be detected using probe/detector. 1 mL aliquots x 5 of lymphazurine blue dye were injected peri-areola and massaged into the tissue x ~5 minutes.   Local anesthetic was then injected, and a medial slightly lower breast incision parallel to circumareolar line was made using a #15 blade scalpel, through which the localization wire was brought out. Appropriate thickness skin flaps were created, and excision was continued deep to chest wall to include the localization wire/needle in its entirety with appropriately wide margins. Long lateral suture and short superior suture were used to orient the specimen, which was then handed off the field for radiographic assessment and pathology processing. Hemostasis was confirmed/achieved, and the wound was re-approximated in layers using buried interrupted 3-0 Vicryl for dermis.   Attention was then directed to the Right axilla, where axillary hair line, pectoralis muscle, and latissimus dorsal muscle were marked using a skin marking pen. Local anesthetic was injected,  and a ~2.5 cm curvilinear incision was made using #15 blade scalpel and extended deep through subcutaneous tissue using blunt dissection and selective electrocautery, tying any small venous and lymphatic branches using silk ties. Again, no radiotracer counts were able to be detected, and a single blue-stained node was clearly  visualized. Dr. Genevive Bi scrubbed at this time and provided helpful retraction and confirmed the absence of any further blue-stained axillary lymph node(s) or radiotracer counts. The lymphovascular pedicle of the blue-stained lymph node was clamped, divided, and tied using 2-0 silk ties, after which the peak radiotracer count of the excised lymph node was recorded, and background radiotracer was likewise documented. No further radiotracer counts, nor any further blue dye, were detected in the remaining axillary bed. Radiographic confirmation that the entire lesion was excised -- including the localizing wire -- was received prior to completion of closure, though radiologist reiterated that biopsy clip was believed to have migrated 4.5 cm and was neither detected in the specimen nor expected to be detected within the specimen. Right axillary dermis was re-approximated buried interrupted 3-0 Vicryl for dermis, after which the patient's skin was then cleaned and dried, and sterile Dermabond skin glue was applied and allowed to dry.  Patient was then safely able to be extubated, awakened, and transferred to PACU for post-operative monitoring and care.  I was present for all aspects of the above procedure, and no operative complications were apparent.

## 2018-12-31 NOTE — Interval H&P Note (Signed)
History and Physical Interval Note:  12/31/2018 12:02 PM  Stacy Warren  has presented today for surgery, with the diagnosis of RIGHT BREAST CANCER  The various methods of treatment have been discussed with the patient and family. After consideration of risks, benefits and other options for treatment, the patient has consented to  Procedure(s): BREAST LUMPECTOMY WITH SENTINEL LYMPH NODE BX, WIRE LOCALIZATION (Right) as a surgical intervention .  The patient's history has been reviewed, patient examined, no change in status, stable for surgery.  I have reviewed the patient's chart and labs.  Questions were answered to the patient's satisfaction.     Vickie Epley

## 2019-01-01 ENCOUNTER — Encounter: Payer: Self-pay | Admitting: Surgery

## 2019-01-01 ENCOUNTER — Encounter: Payer: Self-pay | Admitting: *Deleted

## 2019-01-03 ENCOUNTER — Telehealth: Payer: Self-pay | Admitting: Genetic Counselor

## 2019-01-03 NOTE — Telephone Encounter (Signed)
Dr. Tasia Catchings is referring Stacy Warren for genetic counseling due to a personal and family history of breast cancer. Her blood was already drawn in the clinic and sent to [facility].  I left her a message to call and schedule this telegenetics visit to be done by phone at her convenience.   Steele Berg, [place], [place] Genetic Counselor Phone: [phone]

## 2019-01-04 ENCOUNTER — Encounter: Payer: Self-pay | Admitting: Genetic Counselor

## 2019-01-04 ENCOUNTER — Telehealth: Payer: Self-pay | Admitting: Genetic Counselor

## 2019-01-04 LAB — SURGICAL PATHOLOGY

## 2019-01-04 NOTE — Anesthesia Postprocedure Evaluation (Signed)
Anesthesia Post Note  Patient: Stacy Warren  Procedure(s) Performed: BREAST LUMPECTOMY WITH SENTINEL LYMPH NODE BX, WIRE LOCALIZATION (Right )  Patient location during evaluation: PACU Anesthesia Type: General Level of consciousness: awake and alert Pain management: pain level controlled Vital Signs Assessment: post-procedure vital signs reviewed and stable Respiratory status: spontaneous breathing, nonlabored ventilation, respiratory function stable and patient connected to nasal cannula oxygen Cardiovascular status: blood pressure returned to baseline and stable Postop Assessment: no apparent nausea or vomiting Anesthetic complications: no     Last Vitals:  Vitals:   12/31/18 1551 12/31/18 1654  BP: 133/73 138/84  Pulse: 76 68  Resp:    Temp: 36.5 C   SpO2: 95% 97%    Last Pain:  Vitals:   01/01/19 0820  TempSrc:   PainSc: 3                  Kaitlynne Wenz Harvie Heck

## 2019-01-04 NOTE — Telephone Encounter (Signed)
Cancer Genetics            Telegenetics Initial Visit    Patient Name: Stacy Warren Patient DOB: 07-Oct-1951 Patient Age: 68 y.o. Phone Call Date: 01/04/2019  Referring Provider: Earlie Server, MD  Reason for Visit: Evaluate for hereditary susceptibility to cancer    Assessment and Plan:  . Stacy Warren's personal history of breast cancer at age 79 is not suggestive of a hereditary predisposition to cancer, but a genetics evaluation is warranted given that she has 2 maternal aunts who died in their 5s due to breast cancer.   . Testing is recommended to determine whether she has a pathogenic mutation that will impact her screening and risk-reduction for cancer. A negative result will be reassuring.  . Stacy Warren wished to pursue genetic testing, and her blood was already drawn and sent to [facility]. Analysis will include the 84 genes on [facility]'s Multi-Cancer panel (AIP, ALK, APC, ATM, AXIN2, BAP1, BARD1, BLM, BMPR1A, BRCA1, BRCA2, BRIP1, CASR, CDC73, CDH1, CDK4, CDKN1B, CDKN1C, CDKN2A, CEBPA, CHEK2, CTNNA1, DICER1, DIS3L2, EGFR, EPCAM, FH, FLCN, GATA2, GPC3, GREM1, HOXB13, HRAS, KIT, MAX, MEN1, MET, MITF, MLH1, MSH2, MSH3, MSH6, MUTYH, NBN, NF1, NF2, NTHL1, PALB2, PDGFRA, PHOX2B, PMS2, POLD1, POLE, POT1, PRKAR1A, PTCH1, PTEN, RAD50, RAD51C, RAD51D, RB1, RECQL4, RET, RUNX1, SDHA, SDHAF2, SDHB, SDHC, SDHD, SMAD4, SMARCA4, SMARCB1, SMARCE1, STK11, SUFU, TERC, TERT, TMEM127, TP53, TSC1, TSC2, VHL, WRN, WT1).   . Results should be available in approximately 2-3 weeks, at which point I will contact her and address implications for her as well as address genetic testing for at-risk family members, if needed.     Stacy Warren questions were answered to her satisfaction today and she is welcome to call with any additional questions or concerns. Thank you for the referral and allowing [place] to share in the care of your patient.   Dr. Grayland Ormond was available for questions  concerning this case. Total time spent by counseling by phone was approximately 25 minutes.   _____________________________________________________________________   History of Present Illness: Stacy Warren, a 68 y.o. female, was referred for genetic counseling to discuss the possibility of a hereditary predisposition to cancer and discuss whether genetic testing is warranted. This was a telegenetics visit via phone.  Stacy Warren was diagnosed with breast cancer at the age of 60. She is s/p lumpectomy and will be undergoing radiation treatments. The breast tumor was ER positive, PR positive, and HER2 negative.   Past Medical History:  Diagnosis Date  . Arthritis   . Osteopenia 11/28/2018  . Pulmonary embolism ([place]) 04/09/2018   Completed course of blood thinner in 2007; after femur fracture and surgery    Past Surgical History:  Procedure Laterality Date  . BREAST BIOPSY Right 12/13/2018   stereo bx of mass, coil clip INVASIVE MAMMARY CARCINOMA  . BREAST LUMPECTOMY Right 12/31/2018   [place]  . BREAST LUMPECTOMY WITH NEEDLE LOCALIZATION AND AXILLARY SENTINEL LYMPH NODE BX Right 12/31/2018   Procedure: BREAST LUMPECTOMY WITH SENTINEL LYMPH NODE BX, WIRE LOCALIZATION;  Surgeon: Vickie Epley, MD;  Location: [facility] ORS;  Service: General;  Laterality: Right;  . bunionectomy Left   . CERVICAL CONE BIOPSY  1984  . DILATION AND CURETTAGE OF UTERUS  06/07/2018  . HIP PINNING  2007  . HYSTEROSCOPY W/D&C N/A 06/07/2018   Procedure: DILATATION AND CURETTAGE /HYSTEROSCOPY/MYOSURE POLYPECTOMY;  Surgeon: Malachy Mood, MD;  Location: [facility] ORS;  Service: Gynecology;  Laterality: N/A;  . LEG SURGERY  2007   plate  . TONSILLECTOMY    . VAGINAL DELIVERY     x2    Family History: Significant diagnoses include the following:  Family History  Problem Relation Age of Onset  . Breast cancer Maternal Aunt 35       deceased 34s  . Breast cancer Maternal Aunt 35       deceased 22s  .  Alzheimer's disease Mother        deceased 26  . Lymphoma Father 71       deceased 87  . Heart disease Father   . Skin cancer Brother        currently 60  . Thyroid disease Son   . Breast cancer Paternal Aunt        dx 49s; deceased 61s  . Colon cancer Neg Hx     Additionally, Ms. Nakayama has 2 sons (ages 42 and 48). She has one brother who has a son and daughter. Her mother had 4 sisters and 2 brothers. Two sister are noted above with breast cancer. Her father had a total of 10 siblings. One sister is noted above with breast cancer.  Ms. Bungert ancestry is Caucasian - NOS. There is no known Jewish ancestry and no consanguinity.  Discussion: We reviewed the characteristics, features and inheritance patterns of hereditary cancer syndromes. We discussed her risk of harboring a mutation in the context of her personal and family history. We discussed the process of genetic testing, insurance coverage and implications of results: positive, negative and variant of uncertain significance (VUS).     Steele Berg, MS, [place] Certified Genetic Counselor phone: [phone]

## 2019-01-07 ENCOUNTER — Other Ambulatory Visit: Payer: Self-pay

## 2019-01-07 ENCOUNTER — Inpatient Hospital Stay: Payer: Medicare Other

## 2019-01-07 ENCOUNTER — Inpatient Hospital Stay: Payer: Medicare Other | Attending: Oncology | Admitting: Oncology

## 2019-01-07 ENCOUNTER — Encounter: Payer: Self-pay | Admitting: Oncology

## 2019-01-07 VITALS — BP 141/91 | HR 75 | Temp 96.6°F | Resp 18 | Wt 207.4 lb

## 2019-01-07 DIAGNOSIS — Z170 Estrogen receptor positive status [ER+]: Secondary | ICD-10-CM | POA: Insufficient documentation

## 2019-01-07 DIAGNOSIS — Z923 Personal history of irradiation: Secondary | ICD-10-CM | POA: Insufficient documentation

## 2019-01-07 DIAGNOSIS — Z807 Family history of other malignant neoplasms of lymphoid, hematopoietic and related tissues: Secondary | ICD-10-CM | POA: Diagnosis not present

## 2019-01-07 DIAGNOSIS — Z791 Long term (current) use of non-steroidal anti-inflammatories (NSAID): Secondary | ICD-10-CM | POA: Insufficient documentation

## 2019-01-07 DIAGNOSIS — C50311 Malignant neoplasm of lower-inner quadrant of right female breast: Secondary | ICD-10-CM | POA: Diagnosis present

## 2019-01-07 DIAGNOSIS — Z79899 Other long term (current) drug therapy: Secondary | ICD-10-CM | POA: Insufficient documentation

## 2019-01-07 DIAGNOSIS — M8580 Other specified disorders of bone density and structure, unspecified site: Secondary | ICD-10-CM | POA: Diagnosis not present

## 2019-01-07 DIAGNOSIS — C50911 Malignant neoplasm of unspecified site of right female breast: Secondary | ICD-10-CM

## 2019-01-07 DIAGNOSIS — Z86711 Personal history of pulmonary embolism: Secondary | ICD-10-CM | POA: Diagnosis not present

## 2019-01-07 DIAGNOSIS — Z803 Family history of malignant neoplasm of breast: Secondary | ICD-10-CM

## 2019-01-07 DIAGNOSIS — M1990 Unspecified osteoarthritis, unspecified site: Secondary | ICD-10-CM | POA: Insufficient documentation

## 2019-01-07 DIAGNOSIS — Z7982 Long term (current) use of aspirin: Secondary | ICD-10-CM | POA: Diagnosis not present

## 2019-01-07 DIAGNOSIS — M85832 Other specified disorders of bone density and structure, left forearm: Secondary | ICD-10-CM

## 2019-01-07 NOTE — Progress Notes (Signed)
Hematology/Oncology Consult note [facility] Telephone:([phone] [phone]   Patient Care Team: Arnetha Courser, MD as PCP - General (Family Medicine) Leanor Kail, MD (Orthopedic Surgery)  REFERRING PROVIDER: Dr.Collins.  CHIEF COMPLAINTS/REASON FOR VISIT:  Evaluation of breast cancer  HISTORY OF PRESENTING ILLNESS:  Stacy Warren is a  68 y.o.  female with PMH listed below who was referred to me for evaluation of breast cancer  Patient had screening mammogram on 11/28/2018 which indicate further evaluation. Diagnostic mammogram and ultrasound on 12/07/2018 showed persistent 7 mm mass within the slightly inner palpable lower right breast without sonographic correlate.  Recommend biopsy. Biopsy pathology showed: Invasive mammary carcinoma, no special type, 5 mm in December.  Grade 2, DCIS present,  ER more than 90% positive, PR more than 90% positive, HER-2 negative.  Nipple discharge: Denies Family history: Strong family history of breast cancer 2 maternal aunt deceased from breast cancer in 79s.  Father deceased from [place].  Paternal aunt deceased from breast  OCP use: Remote use of OCP in her 59s for about a year..  Estrogen and progesterone therapy: denies History of radiation to chest: denies.  Previous breast surgery: Denies Menopause status: Post menopause   #History of pulmonary embolism hip surgery.  Took blood thinner for a period of time.  Currently on aspirin 325 mg daily.  INTERVAL HISTORY Stacy Warren is a 68 y.o. 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  During the interval, patient is status post lumpectomy and a sentinel lymph node biopsy. Lumpectomy and a sentinel lymph node biopsy were negative for malignancy. Several intra-ductal papillomata, some associated with usual ductal hyperplasia.  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  No biopsy site radiated parenchymal changes.  Foci of fibroadenomatoid changes.Lymph node negative for metastatic carcinoma.  Today patient is 1 week after surgery.  Reports bruising and soreness of surgical site.  Otherwise no new complaints.  Review of Systems  Constitutional: Negative for appetite change, chills, fatigue and fever.  HENT:   Negative for hearing loss and voice change.   Eyes: Negative for eye problems.  Respiratory: Negative for chest tightness and cough.   Cardiovascular: Negative for chest pain.  Gastrointestinal: Negative for abdominal distention, abdominal pain and blood in stool.  Endocrine: Negative for hot flashes.  Genitourinary: Negative for difficulty urinating and frequency.   Musculoskeletal: Negative for arthralgias.  Skin: Negative for itching and rash.  Neurological: Negative for extremity weakness.  Hematological: Negative for adenopathy.  Psychiatric/Behavioral: Negative for confusion.  Breast lumpectomy and sentinel lymph node biopsy side soreness and bruising.  MEDICAL HISTORY:  Past Medical History:  Diagnosis Date  . Arthritis   . Osteopenia 11/28/2018  . Pulmonary embolism ([place]) 04/09/2018   Completed course of blood thinner in 2007; after femur fracture and surgery    SURGICAL HISTORY: Past Surgical History:  Procedure Laterality Date  . BREAST BIOPSY Right 12/13/2018   stereo bx of mass, coil clip INVASIVE MAMMARY CARCINOMA  . BREAST LUMPECTOMY Right 12/31/2018   [place]  . BREAST LUMPECTOMY WITH NEEDLE LOCALIZATION AND AXILLARY SENTINEL LYMPH NODE BX Right 12/31/2018   Procedure: BREAST LUMPECTOMY WITH SENTINEL LYMPH NODE BX, WIRE LOCALIZATION;  Surgeon: Vickie Epley, MD;  Location: [facility] ORS;  Service: General;  Laterality: Right;  . bunionectomy Left   . CERVICAL CONE BIOPSY  1984  .  DILATION AND CURETTAGE OF UTERUS  06/07/2018  . HIP PINNING  2007  .  HYSTEROSCOPY W/D&C N/A 06/07/2018   Procedure: DILATATION AND CURETTAGE /HYSTEROSCOPY/MYOSURE POLYPECTOMY;  Surgeon: Malachy Mood, MD;  Location: [facility] ORS;  Service: Gynecology;  Laterality: N/A;  . LEG SURGERY  2007   plate  . TONSILLECTOMY    . VAGINAL DELIVERY     x2    SOCIAL HISTORY: Social History   Socioeconomic History  . Marital status: Married    Spouse name: Not on file  . Number of children: Not on file  . Years of education: Not on file  . Highest education level: Not on file  Occupational History  . Occupation: retired  Scientific laboratory technician  . Financial resource strain: Not on file  . Food insecurity:    Worry: Not on file    Inability: Not on file  . Transportation needs:    Medical: Not on file    Non-medical: Not on file  Tobacco Use  . Smoking status: Never Smoker  . Smokeless tobacco: Never Used  Substance and Sexual Activity  . Alcohol use: No  . Drug use: No  . Sexual activity: Not Currently  Lifestyle  . Physical activity:    Days per week: Not on file    Minutes per session: Not on file  . Stress: Not on file  Relationships  . Social connections:    Talks on phone: Not on file    Gets together: Not on file    Attends religious service: Not on file    Active member of club or organization: Not on file    Attends meetings of clubs or organizations: Not on file    Relationship status: Not on file  . Intimate partner violence:    Fear of current or ex partner: Not on file    Emotionally abused: Not on file    Physically abused: Not on file    Forced sexual activity: Not on file  Other Topics Concern  . Not on file  Social History Narrative  . Not on file    FAMILY HISTORY: Family History  Problem Relation Age of Onset  . Breast cancer Maternal Aunt 35       deceased 46s  . Breast cancer Maternal Aunt 35       deceased 26s  . Alzheimer's disease Mother        deceased 71  . Lymphoma Father 6       deceased 50  . Heart disease Father    . Skin cancer Brother        currently 71  . Thyroid disease Son   . Breast cancer Paternal Aunt        dx 81s; deceased 2s  . Colon cancer Neg Hx     ALLERGIES:  has No Known Allergies.  MEDICATIONS:  Current Outpatient Medications  Medication Sig Dispense Refill  . acetaminophen (TYLENOL) 650 MG CR tablet Take 650 mg by mouth every 8 (eight) hours as needed for pain.    Marland Kitchen aspirin EC 81 MG tablet Take 81 mg by mouth daily.    . Calcium-Magnesium-Vitamin D (CALCIUM 1200+D3 PO) Take 1 tablet by mouth 2 (two) times a week.    . cetirizine (ZYRTEC) 10 MG tablet Take 10 mg by mouth daily as needed for allergies.     . Cholecalciferol (VITAMIN D3) 1000 units CAPS Take 2,000 Units by mouth 2 (two) times a week.     Marland Kitchen glucosamine-chondroitin  500-400 MG tablet Take 1 tablet by mouth daily.     Marland Kitchen ibuprofen (ADVIL,MOTRIN) 400 MG tablet Take 1 tablet (400 mg total) by mouth every 6 (six) hours as needed. 30 tablet 0  . Multiple Vitamins-Minerals (CENTRUM SILVER PO) Take 1 tablet by mouth daily.    Marland Kitchen oxyCODONE-acetaminophen (PERCOCET/ROXICET) 5-325 MG tablet Take 1 tablet by mouth every 4 (four) hours as needed for severe pain. 30 tablet 0  . Prasterone (INTRAROSA) 6.5 MG INST Place 6.5 mg vaginally at bedtime. 30 each 11  . Tetrahydrozoline HCl (VISINE OP) Place 2 drops into both eyes daily as needed (for dry eyes).    . TURMERIC CURCUMIN PO Take 500 mg by mouth at bedtime.     . vitamin B-12 (CYANOCOBALAMIN) 1000 MCG tablet Take 1,000 mcg by mouth 4 (four) times a week.      No current facility-administered medications for this visit.      PHYSICAL EXAMINATION: ECOG PERFORMANCE STATUS: 1 - Symptomatic but completely ambulatory Vitals:   01/07/19 1029  BP: (!) 141/91  Pulse: 75  Resp: 18  Temp: (!) 96.6 F (35.9 C)   Filed Weights   01/07/19 1029  Weight: 207 lb 6.4 oz (94.1 kg)    Physical Exam Constitutional:      General: She is not in acute distress. HENT:     Head:  Normocephalic and atraumatic.  Eyes:     General: No scleral icterus.    Pupils: Pupils are equal, round, and reactive to light.  Neck:     Musculoskeletal: Normal range of motion and neck supple.  Cardiovascular:     Rate and Rhythm: Normal rate and regular rhythm.     Heart sounds: Normal heart sounds.  Pulmonary:     Effort: Pulmonary effort is normal. No respiratory distress.     Breath sounds: No wheezing.  Abdominal:     General: Bowel sounds are normal. There is no distension.     Palpations: Abdomen is soft. There is no mass.     Tenderness: There is no abdominal tenderness.  Musculoskeletal: Normal range of motion.        General: No deformity.  Skin:    General: Skin is warm and dry.     Findings: No erythema or rash.  Neurological:     Mental Status: She is alert and oriented to person, place, and time.     Cranial Nerves: No cranial nerve deficit.     Coordination: Coordination normal.  Psychiatric:        Behavior: Behavior normal.        Thought Content: Thought content normal.   Right breast status post lumpectomy and sentinel lymph node biopsy.  Surrounding ecchymosis and swelling.  Healing surgical wound.    LABORATORY DATA:  I have reviewed the data as listed Lab Results  Component Value Date   WBC 6.4 12/21/2018   HGB 13.5 12/21/2018   HCT 41.6 12/21/2018   MCV 86.0 12/21/2018   PLT 249 12/21/2018   Recent Labs    04/09/18 1048 12/21/18 1223  NA 139 137  K 4.3 4.4  CL 102 104  CO2 29 26  GLUCOSE 100* 102*  BUN 13 17  CREATININE 0.79 0.67  CALCIUM 10.1 9.7  GFRNONAA 77 >60  GFRAA 89 >60  PROT 7.7 7.7  ALBUMIN  --  4.1  AST 20 21  ALT 18 19  ALKPHOS  --  60  BILITOT 0.6 0.5   Iron/TIBC/Ferritin/ %Sat No  results found for: IRON, TIBC, FERRITIN, IRONPCTSAT    RADIOGRAPHIC STUDIES: I have personally reviewed the radiological images as listed and agreed with the findings in the report. 12/07/2018 diagnostic mammogram and  ultrasound 2D/3D spot compression views of the RIGHT breast demonstrate a persistent 7 mm partially circumscribed partially obscured oval mass within the slightly INNER probably LOWER RIGHT breast.  Targeted ultrasound is performed, showing no solid or cystic mass, distortion or abnormal shadowing within the entire LOWER and central RIGHT breast. Heterogeneous tissue throughout the RIGHT breast noted.  IMPRESSION: 1. Persistent 7 mm mass within the slightly INNER probable LOWER RIGHT breast without sonographic correlate. Given that this is new since 2018 in this postmenopausal patient, tissue sampling is recommended.  DG bone density 11/28/2018 The BMD measured at Forearm Radius 33% is 0.679 g/cm2 with a T-score of -2.3. This patient is considered osteopenic according to [facility]) criteria.   ASSESSMENT & PLAN:  1. Malignant neoplasm of right female breast, unspecified estrogen receptor status, unspecified site of breast ([place])   2. Family history of breast cancer   3. Osteopenia of left forearm    #Pathology reports was discussed with patient.  No additional malignancy was discovered. Total size of invasive carcinoma of breast was 5 mm T1a N0, ER PR positive, HER-2 negative invasive mammary carcinoma.  Patient's case were discussed on breast cancer tumor board on 01/07/2019. Per radiology and surgery, wire localized mammary tissue not including biopsy associated coil clip due to migration of marker 4.5 cm laterally. Per pathology, Lumpectomy and sentinel lymph node did not show any additional cancer, also specimen did not showed biopsy changes. It is possible that there is remaining breast cancer. Radiologist at Tumor board did not think MRI breast will offer any additional benefit in acute post op setting.  Consensus reached as listed below.  Send OncotypeDx as on mammogram size exceeds 66m.  Mastectomy should be offered as an option to ensure complete removal of  cancer. If she is not interested, adjuvant radiation to reduce local recurrence. She has follow up appointment with surgery next week. Will refer her to Radiation Oncology.  She will be started on Anti estrogen treatment with aromatase inhibitor after completing RT.   #Strong family history of breast cancer, recommend genetic testing.  Patient has been referred to genetic counselor.  #Osteopenia, patient will need anti-estrogen treatments after finishing radiation. Candidate for bisphosphonate treatments.  Briefly discussed.  will discuss during further visits.  We spent sufficient time to discuss many aspect of care, questions were answered to patient's satisfaction. I have called patient to updated her the recommendations from tumor board.  Orders Placed This Encounter  Procedures  . Ambulatory referral to Radiation Oncology    Referral Priority:   Routine    Referral Type:   Consultation    Referral Reason:   Specialty Services Required    Referred to Provider:   CNoreene Filbert MD    Requested Specialty:   Radiation Oncology    Number of Visits Requested:   1    All questions were answered. The patient knows to call the clinic with any problems questions or concerns.  Return of visit: to be determined.  Total face to face encounter time for this patient visit was 25 min. >50% of the time was  spent in counseling and coordination of care.    ZEarlie Server MD, PhD Hematology Oncology CVeterans Affairs Ill[facility] Care Systemat ADriscoll Children'S HospitalPager- [phone]/13/2020

## 2019-01-07 NOTE — Progress Notes (Signed)
Patient here for follow up

## 2019-01-08 ENCOUNTER — Encounter: Payer: Self-pay | Admitting: *Deleted

## 2019-01-08 NOTE — Progress Notes (Signed)
  Oncology Nurse Navigator Documentation  Navigator Location: [facility] (01/08/19 1300)   )Navigator Encounter Type: Telephone (01/08/19 1300) Telephone: Incoming Call (01/08/19 1300)                       Barriers/Navigation Needs: Education;Coordination of Care (01/08/19 1300) Education: Other (01/08/19 1300) Interventions: Coordination of Care (01/08/19 1300)   Coordination of Care: Appts (01/08/19 1300)                  Time Spent with Patient: 30 (01/08/19 1300)   Patient called.  She had spoken to Dr. Tasia Catchings in regards to the case conference discussion last night and was very upset. Had questions.  Answered her questions and reinforced Dr. Collie Siad results with her.  She would like to see Dr. Rosana Hoes sooner than next week.  I have rescheduled her appointment to Thursday, 01/10/19 @ 3:15.

## 2019-01-09 ENCOUNTER — Telehealth: Payer: Self-pay

## 2019-01-09 NOTE — Telephone Encounter (Signed)
Pt states with her surgery last week she was having problems with bowel movements. While straining her pessary fell out and she thinks she will just leave it out until her appointment in March with [facility]. She will be starting Chemo or radiation soon and not sure if she can have it in at that time.   Pt aware it will be fine at this point to leave it out and to bring it with her at her appointment. Pt to keep [facility] updated on her cancer status.

## 2019-01-10 ENCOUNTER — Encounter: Payer: Self-pay | Admitting: *Deleted

## 2019-01-10 ENCOUNTER — Encounter: Payer: Self-pay | Admitting: Surgery

## 2019-01-10 ENCOUNTER — Ambulatory Visit (INDEPENDENT_AMBULATORY_CARE_PROVIDER_SITE_OTHER): Payer: Medicare Other | Admitting: Surgery

## 2019-01-10 ENCOUNTER — Other Ambulatory Visit: Payer: Self-pay

## 2019-01-10 VITALS — BP 128/83 | HR 90 | Temp 98.6°F | Ht 63.0 in | Wt 208.0 lb

## 2019-01-10 DIAGNOSIS — Z170 Estrogen receptor positive status [ER+]: Secondary | ICD-10-CM

## 2019-01-10 DIAGNOSIS — Z4889 Encounter for other specified surgical aftercare: Secondary | ICD-10-CM

## 2019-01-10 DIAGNOSIS — C50311 Malignant neoplasm of lower-inner quadrant of right female breast: Secondary | ICD-10-CM

## 2019-01-10 NOTE — Patient Instructions (Addendum)
Patient will need a referral for a second opinion for left breast tumor ([facility]). Please follow up with Dr.Chrystal.   Call the office with any questions or concerns.

## 2019-01-10 NOTE — Progress Notes (Signed)
Patient needs to follow up with Dr. Baruch Gouty per Dr. Rosana Hoes. An appointment is scheduled for 01-23-19 at 10 am. Patient aware.   The patient is also wanting a second opinion. At this time, patient would like to research on who she would like to see. Patient has requested a copy of her medical records. Patient wishes obtain an appointment for second opinion once she decides who she would like to see.  The patient was instructed to notify our office if we can be of further assistance.

## 2019-01-10 NOTE — Progress Notes (Signed)
Surgical Clinic Progress/Follow-up Note   HPI:  68 y.o. Female presents to clinic for early post-op follow-up 10 Days s/p Right partial mastectomy/lumpectomy with image-guided wire localization and axillary sentinel lymph nodes biopsy Stacy Warren, 12/31/2018). Patient reports mild Right axillary > Right breast peri-incisional soreness with improving peri-incisional bruising without any surrounding incisional redness or drainage. Patient otherwise denies any nipple discharge, N/V, fever/chills, CP, or SOB.  Review of Systems:  Constitutional: denies fever/chills  ENT: denies sore throat, hearing problems  Respiratory: denies shortness of breath, wheezing  Cardiovascular: denies chest pain, palpitations  Breast: pain, bruising, wound drainage, and nipple discharge as per interval history Skin: Denies any other rashes or skin discolorations except post-surgical wounds as per interval history  Vital Signs:  BP 128/83   Pulse 90   Temp 98.6 F (37 C) (Temporal)   Ht 5\' 3"  (1.6 m)   Wt 208 lb (94.3 kg)   SpO2 96%   BMI 36.85 kg/m    Physical Exam:  Constitutional:  -- Obese body habitus  -- Awake, alert, and oriented x3  Pulmonary:  -- No crackles -- Equal breath sounds bilaterally -- Breathing non-labored at rest Cardiovascular:  -- S1, S2 present  -- No pericardial rubs  Breast: -- Right breast peri-incisional ecchymosis and appropriate mild-/moderate- peri-incisional tenderness to palpation -- Post-surgical incisions all well-approximated without any peri-incisional erythema or drainage -- No Right breast mass(es) or nipple discharge appreciated Musculoskeletal / Integumentary:  -- Wounds or skin discoloration: None appreciated except post-surgical incisions as described above (Breast) -- Extremities: B/L UE and LE FROM, hands and feet warm  Imaging: Right breast mammographic pre-surgical wire-localization (12/31/2018) Using mammographic guidance, sterile technique, 1% lidocaine,   and a 11 cm modified Kopans needle, the expected location of the  mass in the lower inner right breast, posterior depth, was localized  using a medial approach, using the post-biopsy marker which has  reportedly displaced 4.5 cm laterally for reference. The expected  location of the mass is located just posterior to middle reinforced  portion of the wire. The images were marked for Dr. Rosana Warren.  Right breast specimen radiograph (12/31/2018) The wire tip is present. The coil shaped post biopsy  marker is not present in the surgical specimen; however,  given the 4.5 cm lateral migration reported after stereotactic  biopsy, it was not expected to be within the specimen.  Intra-Operative Surgical Pathology (12/31/2018): A. Intraoperative Consultation:    Labeled: Right breast lumpectomy    Received: Fresh    Specimen: Breast lumpectomy with wire localization    Pathologic evaluation performed: Gross margin evaluation    Diagnosis: OR consult - no tumor seen at gross margins.    Communicated to: Dr. Rosana Warren at 2:15 PM on 12/31/2018 by E. Rodney Cruise, M.D.  Final Surgical Pathology (12/31/2018): A. BREAST, RIGHT, NEEDLE LOCALIZED EXCISION:  - SEVERAL INTRADUCTAL PAPILLOMATA, SOME ASSOCIATED WITH USUAL DUCT  HYPERPLASIA.  - COLUMNAR CELL CHANGE WITHOUT ATYPIA ASSOCIATED WITH INTRADUCTAL  PAPILLOMA AND RADIAL SCAR.  - FOCI OF FIBROADENOMATOID CHANGE.  - NO BIOPSY SITE RELATED PARENCHYMAL CHANGES.   B. LYMPH NODE, SENTINEL, RIGHT AXILLA, BIOPSY:  - 1 LYMPH NODE, NEGATIVE FOR METASTATIC CARCINOMA.   The preliminary findings were discussed with Dr. Rosana Warren on  01/03/2019. I have also taken the specimen radiograph and consulted  with Dr. Purcell Nails about possible other sites in the specimen mammogram  that could possibly contain a lesion that we may not have previously sampled.  Consequent to this discussion on 01/04/2019 an  additional 31 slides  encompassing the entire surgical pathology specimen  (61 slides in total  examined for specimen A), are submitted for histologic examination and apart  from a few areas of hemorrhage immediately below the margin surface in  some slides, no definite fibrous tissue or granulation tissue response as  would be expected with a previous biopsy site can be identified.  Assessment:  68 y.o. yo Female with a problem list including...  Patient Active Problem List   Diagnosis Date Noted  . Malignant neoplasm of lower-inner quadrant of right breast of female, estrogen receptor positive ([place]) 12/22/2018  . Osteopenia 11/28/2018  . Acute cystitis 10/16/2018  . Obesity (BMI 30.0-34.9) 09/24/2018  . Osteoarthritis 09/24/2018  . 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 Urinary, incontinence, stress female 04/09/2018  . Family history of breast cancer 02/22/2017  . History of pulmonary embolism 12/02/2015    presents to clinic for early post-operative follow-up, doing overall well, 10 Days s/p Right breast lumpectomy with image-guided wire localization and axillary sentinel lymph node biopsy Stacy Warren, 12/31/2018) for core needle biopsy-proven 5-7 mm invasive Right breast ductal/mammary carcinoma despite pre-operative non-localization of mass with radiographic needle/wire localization.  Plan:              - results of surgical pathology and subsequent tumor board discussion reviewed at length             - mastectomy despite small residual fragment of invasive mammary carcinoma vs radiation therapy and short-interval mammographic follow-up including breast MRI at 1 year were discussed  - less favorable options of close radiographic monitoring without therapy at this time and repeat lumpectomy despite unreliable radiographic localization also acknowledged, but not advised  - consultation with radiation oncologist (Dr. Baruch Gouty) encouraged as likely to be helpful with decision-making and a very reasonable, albeit non-standard, treatment option for  this patient  - a second opinion may/should be considered to ensure confidence with whichever treatment option is selected, and referral was offered accordingly             - no heavy lifting >15 - 20 lbs or strenuous activities x 2 weeks, after which no heavy lifting >40 lbs x 4 weeks, after which may gradually resume all activities without restrictions             - apply sunblock particularly to incisions with sun exposure to reduce pigmentation of scars             - return to clinic to be determined, instructed to call office if any questions or concerns  All of the above recommendations were discussed with the patient and her husband, and all of patient's and family's questions were answered to their expressed satisfaction.  -- Marilynne Drivers Stacy Hoes, MD, [place]: [facility] - Partnering for exceptional care. Office: [phone]

## 2019-01-15 ENCOUNTER — Telehealth: Payer: Self-pay | Admitting: Genetic Counselor

## 2019-01-15 ENCOUNTER — Encounter: Payer: Medicare Other | Admitting: Surgery

## 2019-01-15 NOTE — Telephone Encounter (Signed)
Cancer Genetics             Telegenetics Results Disclosure   Patient Name: Stacy Warren Patient DOB: 10-09-1951 Patient Age: 68 y.o. Phone Call Date: 01/15/2019  Referring Provider: Earlie Server, MD    Ms. Blackie was called today to discuss genetic test results. Please see the Genetics telephone note from 01/04/2019 for a detailed discussion of her personal and family histories and the recommendations provided.  Genetic Testing: At the time of Ms. Dekoning's telegenetics visit, she decided to pursue genetic testing of multiple genes associated with hereditary susceptibility to cancer. Testing included sequencing and deletion/duplication analysis. Testing did not reveal a pathogenic mutation in any of the genes analyzed.  A copy of the genetic test report will be scanned into Epic under the Media tab.  A Variant of Uncertain Significance was detected: BAP1 c.[id number]>A ([employer]). This is still considered a normal result. While at this time, it is unknown if this finding is associated with increased cancer risk, the majority of these variants get reclassified to be inconsequential. Medical management should not be based on this finding. The lab will eventually determine the significance, if any. If it gets reclassified by the lab, she will receive a new report via their secure portal, by secure email, or by mail. Ms. Laduke should stay in contact with [place] on a yearly basis if she is interested in knowing the status of this result and keep her address and phone number up to date in the system.  Interpretation: These results suggest that Ms. Reimers's cancer was most likely not due to an inherited predisposition. Most cancers happen by chance and this test, along with details of her family history, suggests that her cancer falls into this category.   Since the current test is not perfect, it is possible that there may be a gene mutation that current testing cannot  detect, but that chance is small. It is possible that a different genetic factor, which has not yet been discovered or is not on this panel, is responsible for the cancer diagnoses in the family. Again, the likelihood of this is low. Lastly, it may be that there is a detectable mutation in her family that she did not inherit. No additional testing is recommended at this time for Ms. Madison.  Genes Analyzed: The genes analyzed were the 84 genes on [facility]'s Multi-Cancer panel (AIP, ALK, APC, ATM, AXIN2, BAP1, BARD1, BLM, BMPR1A, BRCA1, BRCA2, BRIP1, CASR, CDC73, CDH1, CDK4, CDKN1B, CDKN1C, CDKN2A, CEBPA, CHEK2, CTNNA1, DICER1, DIS3L2, EGFR, EPCAM, FH, FLCN, GATA2, GPC3, GREM1, HOXB13, HRAS, KIT, MAX, MEN1, MET, MITF, MLH1, MSH2, MSH3, MSH6, MUTYH, NBN, NF1, NF2, NTHL1, PALB2, PDGFRA, PHOX2B, PMS2, POLD1, POLE, POT1, PRKAR1A, PTCH1, PTEN, RAD50, RAD51C, RAD51D, RB1, RECQL4, RET, RUNX1, SDHA, SDHAF2, SDHB, SDHC, SDHD, SMAD4, SMARCA4, SMARCB1, SMARCE1, STK11, SUFU, TERC, TERT, TMEM127, TP53, TSC1, TSC2, VHL, WRN, WT1).  Cancer Screening: Ms. Bible is recommended to follow the cancer screening guidelines provided by her physicians.   Family Members: Family members are at some increased risk of developing cancer, over the general population risk, simply due to the family history. They are recommended to speak with their own providers about appropriate cancer screenings.   Any relative who had cancer at a young age or had a particularly rare cancer may also wish to pursue genetic testing. Genetic counselors can be located in other  cities, by visiting the website of the [employer] ([employer]) and Field seismologist for a Dietitian by zip code.   Family members are not recommended to get tested for the above VUS outside of a research protocol as this finding has no implications for their medical management.  Follow-Up: Cancer genetics is a rapidly advancing field and it is  possible that new genetic tests will be appropriate for Ms. Dodge in the future. Ms. Catarino is encouraged to remain in contact with Genetics on an annual basis so we can update her personal and family histories, and let her know of advances in cancer genetics that may benefit the family. Ms. Lavalais questions were answered to her satisfaction today, and she knows she is welcome to call anytime with additional questions.    Steele Berg, MS, [place] Certified Genetic Counselor phone: [phone]

## 2019-01-21 ENCOUNTER — Ambulatory Visit: Payer: Medicare Other

## 2019-01-21 ENCOUNTER — Institutional Professional Consult (permissible substitution): Payer: Medicare Other | Admitting: Radiation Oncology

## 2019-01-22 ENCOUNTER — Encounter: Payer: Self-pay | Admitting: Oncology

## 2019-01-23 ENCOUNTER — Ambulatory Visit (INDEPENDENT_AMBULATORY_CARE_PROVIDER_SITE_OTHER): Payer: Medicare Other | Admitting: Family Medicine

## 2019-01-23 ENCOUNTER — Ambulatory Visit
Admission: RE | Admit: 2019-01-23 | Discharge: 2019-01-23 | Disposition: A | Discharge status: Home / Self Care | Payer: Medicare Other | Source: Ambulatory Visit | Attending: Radiation Oncology | Admitting: Radiation Oncology

## 2019-01-23 ENCOUNTER — Encounter: Payer: Self-pay | Admitting: Family Medicine

## 2019-01-23 ENCOUNTER — Encounter: Payer: Self-pay | Admitting: Radiation Oncology

## 2019-01-23 ENCOUNTER — Other Ambulatory Visit: Payer: Self-pay

## 2019-01-23 ENCOUNTER — Encounter: Payer: Self-pay | Admitting: *Deleted

## 2019-01-23 VITALS — BP 150/90 | HR 70 | Temp 96.9°F | Resp 16 | Wt 210.4 lb

## 2019-01-23 VITALS — BP 118/72 | HR 71 | Temp 97.7°F | Resp 12 | Ht 63.0 in | Wt 210.8 lb

## 2019-01-23 DIAGNOSIS — M129 Arthropathy, unspecified: Secondary | ICD-10-CM | POA: Insufficient documentation

## 2019-01-23 DIAGNOSIS — E669 Obesity, unspecified: Secondary | ICD-10-CM | POA: Diagnosis not present

## 2019-01-23 DIAGNOSIS — Z79899 Other long term (current) drug therapy: Secondary | ICD-10-CM | POA: Insufficient documentation

## 2019-01-23 DIAGNOSIS — C50311 Malignant neoplasm of lower-inner quadrant of right female breast: Secondary | ICD-10-CM

## 2019-01-23 DIAGNOSIS — Z86711 Personal history of pulmonary embolism: Secondary | ICD-10-CM | POA: Insufficient documentation

## 2019-01-23 DIAGNOSIS — Z803 Family history of malignant neoplasm of breast: Secondary | ICD-10-CM | POA: Insufficient documentation

## 2019-01-23 DIAGNOSIS — S6992XA Unspecified injury of left wrist, hand and finger(s), initial encounter: Secondary | ICD-10-CM | POA: Diagnosis not present

## 2019-01-23 DIAGNOSIS — Z170 Estrogen receptor positive status [ER+]: Secondary | ICD-10-CM | POA: Insufficient documentation

## 2019-01-23 DIAGNOSIS — Z23 Encounter for immunization: Secondary | ICD-10-CM

## 2019-01-23 DIAGNOSIS — Z7982 Long term (current) use of aspirin: Secondary | ICD-10-CM | POA: Diagnosis not present

## 2019-01-23 DIAGNOSIS — Z806 Family history of leukemia: Secondary | ICD-10-CM | POA: Diagnosis not present

## 2019-01-23 DIAGNOSIS — M8580 Other specified disorders of bone density and structure, unspecified site: Secondary | ICD-10-CM | POA: Insufficient documentation

## 2019-01-23 DIAGNOSIS — Z0000 Encounter for general adult medical examination without abnormal findings: Secondary | ICD-10-CM | POA: Diagnosis not present

## 2019-01-23 DIAGNOSIS — Z808 Family history of malignant neoplasm of other organs or systems: Secondary | ICD-10-CM | POA: Diagnosis not present

## 2019-01-23 NOTE — Consult Note (Signed)
NEW PATIENT EVALUATION  Name: Stacy Warren  MRN: [id number]  Date:   01/23/2019     DOB: 04-24-1951   This 68 y.o. 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 Breast Cancer    Initial consultation    DIAGNOSIS: The encounter diagnosis was Malignant neoplasm of lower-inner quadrant of right breast of female, estrogen receptor positive ([place]).   PREVIOUS INVESTIGATIONS:  Mammogram and ultrasound reviewed Clinical notes reviewed Pathology reports reviewed Case presented at tumor conference  HPI: patient is a 68 year old female who presented in December 2019 with an abnormal mammogram and ultrasound showing a persistent 7 mm mass in the right breast lower inner quadrant. 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 Tumor was strongly ER/PR positive HER-2/neu negative.she underwent a wide local excision and sentinel node biopsy. Unfortunately no biopsy site was seen in the pathology specimen. Sentinel node was negative for metastatic disease. Patient has been on chronic aspirin she has significant ecchymosis in the surgical site.no residual carcinoma was seen in the pathologic specimen of the breast.radiology stated that was migration of the marker 4.5 cm laterally Case was discussed at tumor conference further imaging including MRI was not thought to be helpful. Recommendation was for adjuvant radiation therapy with high dose of her lumpectomy sitewith electron boost. She is seen today for opinion. She is having some soreness of the breast again some's large area of ecchymosis associated with prior surgery. Patient had Oncotype DX performed showing a 0 recurrence score result  PLANNED TREATMENT  REGIMEN: whole breast radiation with scar boost  PAST MEDICAL HISTORY:  has a past medical history of Arthritis, Osteopenia (11/28/2018), and Pulmonary embolism ([place]) (04/09/2018).    PAST SURGICAL HISTORY:  Past Surgical History:  Procedure Laterality Date  . BREAST BIOPSY Right 12/13/2018   stereo bx of mass, coil clip INVASIVE MAMMARY CARCINOMA  . BREAST LUMPECTOMY Right 12/31/2018   [place]  . BREAST LUMPECTOMY WITH NEEDLE LOCALIZATION AND AXILLARY SENTINEL LYMPH NODE BX Right 12/31/2018   Procedure: BREAST LUMPECTOMY WITH SENTINEL LYMPH NODE BX, WIRE LOCALIZATION;  Surgeon: Vickie Epley, MD;  Location: [facility] ORS;  Service: General;  Laterality: Right;  . bunionectomy Left   . CERVICAL CONE BIOPSY  1984  . DILATION AND CURETTAGE OF UTERUS  06/07/2018  . HIP PINNING  2007  . HYSTEROSCOPY W/D&C N/A 06/07/2018   Procedure: DILATATION AND CURETTAGE /HYSTEROSCOPY/MYOSURE POLYPECTOMY;  Surgeon: Malachy Mood, MD;  Location: [facility] ORS;  Service: Gynecology;  Laterality: N/A;  . LEG SURGERY  2007   plate  . TONSILLECTOMY    . VAGINAL DELIVERY     x2    FAMILY HISTORY: family history includes Alzheimer's disease in her mother; Breast cancer in her paternal aunt; Breast cancer (age of onset: 23) in her maternal aunt and maternal aunt; Heart disease in her father; Lymphoma (age of onset: 88) in her father; Skin cancer in her brother; Thyroid disease in her son.  SOCIAL HISTORY:  reports that she has never smoked. She has never used smokeless tobacco. She reports that she does not drink alcohol or use drugs.  ALLERGIES: Patient has no known allergies.  MEDICATIONS:  Current Outpatient Medications  Medication Sig Dispense Refill  .  acetaminophen (TYLENOL) 650 MG CR tablet Take 650 mg by mouth every 8 (eight) hours as needed for pain.    Marland Kitchen aspirin EC 81 MG tablet Take 81 mg by mouth daily.    . Calcium-Magnesium-Vitamin D (CALCIUM 1200+D3 PO) Take 1 tablet by mouth 2 (two) times a week.     . cetirizine (ZYRTEC) 10 MG tablet Take 10 mg by mouth daily as needed for allergies.     . Cholecalciferol (VITAMIN D3) 1000 units CAPS Take 1,000 Units by mouth daily.    Marland Kitchen glucosamine-chondroitin 500-400 MG tablet Take 1 tablet by mouth daily.     Marland Kitchen ibuprofen (ADVIL,MOTRIN) 400 MG tablet Take 1 tablet (400 mg total) by mouth every 6 (six) hours as needed. 30 tablet 0  . Multiple Vitamins-Minerals (CENTRUM SILVER PO) Take 1 tablet by mouth daily.    . Tetrahydrozoline HCl (VISINE OP) Place 2 drops into both eyes daily as needed (for dry eyes).    . TURMERIC CURCUMIN PO Take 500 mg by mouth at bedtime.     . vitamin B-12 (CYANOCOBALAMIN) 1000 MCG tablet Take 1,000 mcg by mouth 4 (four) times a week.     . Prasterone (INTRAROSA) 6.5 MG INST Place 6.5 mg vaginally at bedtime. (Patient not taking: Reported on 01/10/2019) 30 each 11   No current facility-administered medications for this encounter.     ECOG PERFORMANCE STATUS:  0 - Asymptomatic  REVIEW OF SYSTEMS:  Patient denies any weight loss, fatigue, weakness, fever, chills or night sweats. Patient denies any loss of vision, blurred vision. Patient denies any ringing  of the ears or hearing loss. No irregular heartbeat. Patient denies heart murmur or history of fainting. Patient denies any chest pain or pain radiating to her upper extremities. Patient denies any shortness of breath, difficulty breathing at night, cough or hemoptysis. Patient denies any swelling in the lower legs. Patient denies any nausea vomiting, vomiting of blood, or coffee ground material in the vomitus. Patient denies any stomach pain. Patient states has had normal bowel movements no significant constipation or diarrhea. Patient denies any dysuria, hematuria or significant nocturia. Patient denies any problems walking, swelling in the joints or loss of balance. Patient denies any skin changes, loss of hair or loss of weight. Patient denies any excessive worrying or anxiety or  significant depression. Patient denies any problems with insomnia. Patient denies excessive thirst, polyuria, polydipsia. Patient denies any swollen glands, patient denies easy bruising or easy bleeding. Patient denies any recent infections, allergies or URI. Patient "s visual fields have not changed significantly in recent time.    PHYSICAL EXAM: BP (!) 150/90 (BP Location: Left Arm, Patient Position: Sitting)   Pulse 70   Temp (!) 96.9 F (36.1 C) (Tympanic)   Resp 16   Wt 210 lb 6.9 oz (95.5 kg)   BMI 37.28 kg/m  Right breast is large area of ecchymosis. No dominant masses noted in either breast in 2 positions examined. No illary or supraclavicular adenopathy is identified. The incision is healing well.Well-developed well-nourished patient in NAD. HEENT reveals PERLA, EOMI, discs not visualized.  Oral cavity is clear. No oral mucosal lesions are identified. Neck is clear without evidence of cervical or supraclavicular adenopathy. Lungs are clear to A&P. Cardiac examination is essentially unremarkable with regular rate and rhythm without murmur rub or thrill. Abdomen is benign with no organomegaly or masses noted. Motor sensory and DTR levels are equal and symmetric in the upper and lower extremities. Cranial nerves II through  XII are grossly intact. Proprioception is intact. No peripheral adenopathy or edema is identified. No motor or sensory levels are noted. Crude visual fields are within normal range.  LABORATORY DATA: pathology reports reviewed    RADIOLOGY RESULTS:mammogram and ultrasound reviewed   IMPRESSION: stage I invasive mammary carcinoma ER/PR positive status post wide local excision with doxycycline not seen or any residual carcinoma noted in 68 year old female.  PLAN: at this time of offer the patient as an alternative to mastectomy which also was proposed and conference whole breast radiation. Her breasts are large and would not be suitable for hypofractionated course of  treatment. I would plan on delivering 5040 cGy in 28 fractions to her whole breast. Would also boost the area of initial architectural distortion another 1600 cGy using electron beam. Risks and benefits of treatment including skin reaction fatigue alteration of blood counts possible inclusion of superficial lung all were discussed in detail with the patient and her husband. Patient is going for [facility] opinion at [facility] She adenoid does not want to have mastectomy. I've also informed her that based on the small nature of her disease and low Oncotype DX score should she have recurrence in this breast at that time mastectomy may be indicated although this was certainly not overall impact her overall survival. Patient has been both seem to comprehend my treatment plan well. I personally 7 ordered CT simulation after her visit at [facility]. Patient knows to call with any concerns.  I would like to take this opportunity to thank you for allowing me to participate in the care of your patient.Noreene Filbert, MD

## 2019-01-23 NOTE — Assessment & Plan Note (Signed)
USPSTF grade A and B recommendations reviewed with patient; age-appropriate recommendations, preventive care, screening tests, etc discussed and encouraged; healthy living encouraged; see AVS for patient education given to patient  

## 2019-01-23 NOTE — Assessment & Plan Note (Signed)
Going today to see Dr. Baruch Gouty; supportive listening provided; I explained that I am not knowledgeable in terms of clip movement or surgical questions related to her concerns; she will see surgeon for 2nd opinion she says

## 2019-01-23 NOTE — Progress Notes (Signed)
BP 118/72   Pulse 71   Temp 97.7 F (36.5 C) (Oral)   Resp 12   Ht 5' 3" (1.6 m)   Wt 210 lb 12.8 oz (95.6 kg)   SpO2 95%   BMI 37.34 kg/m    Subjective:    Patient ID: Stacy Warren, female    DOB: 1951-12-16, 68 y.o.   MRN: [id number]  HPI: Stacy Warren is a 68 y.o. female  Chief Complaint  Patient presents with  . Annual Exam    HPI  Patient is here for her annual physical  She has had a right breast lumpectomy, and she has been worried; the clip was not in the path specimen submitted; she sees Dr. Baruch Gouty this morning and will go for a second opinion; she is concerned about the clip being displaced and not being in the submitted tissue; she does not want the clip there if it's not supposed to be; sometimes having sharp pain at the site; some bruising still remaining; seeing rad onc today    USPSTF grade A and B recommendations Depression:  Depression screen [facility] 2/9 01/23/2019 11/16/2018 09/24/2018 04/09/2018  Decreased Interest 0 0 0 0  Down, Depressed, Hopeless 0 0 0 0  PHQ - 2 Score 0 0 0 0  Altered sleeping 0 0 0 -  Tired, decreased energy 0 0 0 -  Change in appetite 0 0 0 -  Feeling bad or failure about yourself  0 0 0 -  Trouble concentrating 0 0 0 -  Moving slowly or fidgety/restless 0 0 0 -  Suicidal thoughts 0 0 0 -  PHQ-9 Score 0 0 0 -  Difficult doing work/chores Not difficult at all Not difficult at all Not difficult at all -   Hypertension: BP Readings from Last 3 Encounters:  01/23/19 (!) 150/90  01/23/19 118/72  01/10/19 128/83   Obesity: Wt Readings from Last 3 Encounters:  01/23/19 210 lb 6.9 oz (95.5 kg)  01/23/19 210 lb 12.8 oz (95.6 kg)  01/10/19 208 lb (94.3 kg)   BMI Readings from Last 3 Encounters:  01/23/19 37.28 kg/m  01/23/19 37.34 kg/m  01/10/19 36.85 kg/m    Skin cancer: two years ago had some removed; has a brown keratotic lesion right forearm Lung cancer:  Never smoker; married to a smoker Breast cancer: just  diagnosed, just had surgery; going to radiation oncologist today Colorectal cancer: colonoscopy 2012, ten year pass per chart; no fam hx of colon cancer Cervical cancer screening: over 43 BRCA gene screening: family hx of breast and/or ovarian cancer and/or metastatic prostate cancer?yes Breast cancer runs in the family; she had the genetics testing done, that gave her peace HIV, hep B, hep C: discussed Hep C, declined HIV STD testing and prevention (chl/gon/syphilis): declined Intimate partner violence: no abuse Contraception: n/a Osteoporosis: just done Dec 2019, due Dec 2021 Fall prevention/vitamin D: discussed Immunizations: sounds like she is up-to-date with flu, PPSV-23 and PCV_13 but we will request records; CMA to discuss shingrix Diet: taking calcium twice a week, tries to eat yogurt, greens, cheese Exercise: no regular exercise, start low and build up slowly Alcohol:    [facility]/23/[facility] in [facility]  AUDIT-C Score  0     Tobacco use: never AAA: n/a Aspirin: taking 81 mg daily now Glucose:  Glucose, Bld  Date Value Ref Range Status  12/21/2018 102 (H) 70 - 99 mg/dL Final  04/09/2018 100 (H) 65 - 99  mg/dL Final    Comment:    .            Fasting reference interval . For someone without known diabetes, a glucose value between 100 and 125 mg/dL is consistent with prediabetes and should be confirmed with a follow-up test. .    Lipids:  Lab Results  Component Value Date   CHOL 160 04/09/2018   Lab Results  Component Value Date   HDL 52 04/09/2018   Lab Results  Component Value Date   LDLCALC 87 04/09/2018   Lab Results  Component Value Date   TRIG 117 04/09/2018   Lab Results  Component Value Date   CHOLHDL 3.1 04/09/2018   No results found for: LDLDIRECT   Depression screen [facility] 2/9 01/23/2019 11/16/2018 09/24/2018 04/09/2018  Decreased Interest 0 0 0 0  Down, Depressed, Hopeless 0 0 0 0  PHQ - 2 Score 0 0 0 0    Altered sleeping 0 0 0 -  Tired, decreased energy 0 0 0 -  Change in appetite 0 0 0 -  Feeling bad or failure about yourself  0 0 0 -  Trouble concentrating 0 0 0 -  Moving slowly or fidgety/restless 0 0 0 -  Suicidal thoughts 0 0 0 -  PHQ-9 Score 0 0 0 -  Difficult doing work/chores Not difficult at all Not difficult at all Not difficult at all -   Fall Risk  01/23/2019 01/10/2019 12/20/2018 11/16/2018 09/24/2018  Falls in the past year? 0 0 0 0 Yes  Number falls in past yr: 0 0 - 0 1  Injury with Fall? 0 0 - - No  Follow up - - Falls evaluation completed - -    Relevant past medical, surgical, family and social history reviewed Past Medical History:  Diagnosis Date  . Arthritis   . Osteopenia 11/28/2018  . Pulmonary embolism ([place]) 04/09/2018   Completed course of blood thinner in 2007; after femur fracture and surgery   Past Surgical History:  Procedure Laterality Date  . BREAST BIOPSY Right 12/13/2018   stereo bx of mass, coil clip INVASIVE MAMMARY CARCINOMA  . BREAST LUMPECTOMY Right 12/31/2018   [place]  . BREAST LUMPECTOMY WITH NEEDLE LOCALIZATION AND AXILLARY SENTINEL LYMPH NODE BX Right 12/31/2018   Procedure: BREAST LUMPECTOMY WITH SENTINEL LYMPH NODE BX, WIRE LOCALIZATION;  Surgeon: Vickie Epley, MD;  Location: [facility] ORS;  Service: General;  Laterality: Right;  . bunionectomy Left   . CERVICAL CONE BIOPSY  1984  . DILATION AND CURETTAGE OF UTERUS  06/07/2018  . HIP PINNING  2007  . HYSTEROSCOPY W/D&C N/A 06/07/2018   Procedure: DILATATION AND CURETTAGE /HYSTEROSCOPY/MYOSURE POLYPECTOMY;  Surgeon: Malachy Mood, MD;  Location: [facility] ORS;  Service: Gynecology;  Laterality: N/A;  . LEG SURGERY  2007   plate  . TONSILLECTOMY    . VAGINAL DELIVERY     x2   Family History  Problem Relation Age of Onset  . Breast cancer Maternal Aunt 35       deceased 80s  . Breast cancer Maternal Aunt 35       deceased 104s  . Alzheimer's disease Mother        deceased 17  .  Lymphoma Father 26       deceased 63  . Heart disease Father   . Skin cancer Brother        currently 39  . Thyroid disease Son   . Breast cancer Paternal Aunt  dx 16s; deceased 19s  . Colon cancer Neg Hx    Social History   Tobacco Use  . Smoking status: Never Smoker  . Smokeless tobacco: Never Used  Substance Use Topics  . Alcohol use: No  . Drug use: No     Office Visit from 01/23/2019 in [facility]  AUDIT-C Score  0      Interim medical history since last visit reviewed. Allergies and medications reviewed  Review of Systems  Constitutional: Negative for unexpected weight change.  Genitourinary:       Bladder dropped; had 9 and 10 pound babies; seeing Dr. Georgianne Fick for pessary  Skin: Positive for wound (right hand, dorsum, cut hand on bed last night).   Per HPI unless specifically indicated above     Objective:    BP 118/72   Pulse 71   Temp 97.7 F (36.5 C) (Oral)   Resp 12   Ht 5' 3" (1.6 m)   Wt 210 lb 12.8 oz (95.6 kg)   SpO2 95%   BMI 37.34 kg/m   Wt Readings from Last 3 Encounters:  01/23/19 210 lb 6.9 oz (95.5 kg)  01/23/19 210 lb 12.8 oz (95.6 kg)  01/10/19 208 lb (94.3 kg)    Physical Exam Constitutional:      Appearance: Normal appearance. She is well-developed.  HENT:     Right Ear: Hearing, tympanic membrane, ear canal and external ear normal.     Left Ear: Hearing, tympanic membrane, ear canal and external ear normal.     Mouth/Throat:     Pharynx: No posterior oropharyngeal erythema.  Eyes:     General: No scleral icterus.       Right eye: No hordeolum.        Left eye: No hordeolum.     Conjunctiva/sclera: Conjunctivae normal.  Neck:     Thyroid: No thyromegaly.     Vascular: No carotid bruit.  Cardiovascular:     Rate and Rhythm: Normal rate and regular rhythm.  No extrasystoles are present.    Heart sounds: Normal heart sounds, S1 normal and S2 normal.  Pulmonary:     Effort: Pulmonary effort is  normal. No respiratory distress.     Breath sounds: Normal breath sounds.  Abdominal:     General: Bowel sounds are normal. There is no distension or abdominal bruit.     Palpations: Abdomen is soft. There is no mass or pulsatile mass.     Tenderness: There is no abdominal tenderness.     Hernia: No hernia is present.  Musculoskeletal: Normal range of motion.  Lymphadenopathy:     Head:     Right side of head: No submandibular adenopathy.     Left side of head: No submandibular adenopathy.     Cervical: No cervical adenopathy.  Skin:    General: Skin is warm and dry.     Coloration: Skin is not pale.     Findings: Lesion (light brown keratotic papule about 5 mm, c/w SK) present. No bruising or ecchymosis.          Comments: Skin tear with dark dried blood on dorsum of right hand  Neurological:     Mental Status: She is alert.     Motor: No tremor or abnormal muscle tone.     Gait: Gait normal.     Deep Tendon Reflexes:     Reflex Scores:      Patellar reflexes are 2+ on the right side and  2+ on the left side. Psychiatric:        Mood and Affect: Mood is not anxious or depressed.        Speech: Speech normal.        Behavior: Behavior normal.        Thought Content: Thought content normal.       Assessment & Plan:   Problem List Items Addressed This Visit      Other   Routine general medical examination at a health care facility - Primary    USPSTF grade A and B recommendations reviewed with patient; age-appropriate recommendations, preventive care, screening tests, etc discussed and encouraged; healthy living encouraged; see AVS for patient education given to patient       Obesity (BMI 35.0-39.9 without comorbidity)    Encouragement given to work on weight loss; see AVS      Malignant neoplasm of lower-inner quadrant of right breast of female, estrogen receptor positive ([place])    Going today to see Dr. Baruch Gouty; supportive listening provided; I explained that I am not  knowledgeable in terms of clip movement or surgical questions related to her concerns; she will see surgeon for 2nd opinion she says       Other Visit Diagnoses    Hand injury, left, initial encounter       offered and gave tetanus booster today; no evidence of infection   Relevant Orders   Tdap vaccine greater than or equal to 7yo IM (Completed)   Need for tetanus, diphtheria, and acellular pertussis (Tdap) vaccine       tetanus booster given today   Relevant Orders   Tdap vaccine greater than or equal to 7yo IM (Completed)       Follow up plan: Return in 1 year (on 01/24/2020).  An after-visit summary was printed and given to the patient at [facility].  Please see the patient instructions which may contain other information and recommendations beyond what is mentioned above in the assessment and plan.  No orders of the defined types were placed in this encounter.   Orders Placed This Encounter  Procedures  . Tdap vaccine greater than or equal to 7yo IM

## 2019-01-23 NOTE — Assessment & Plan Note (Signed)
Encouragement given to work on weight loss; see AVS

## 2019-01-23 NOTE — Patient Instructions (Addendum)
Health Maintenance, Female Adopting a healthy lifestyle and getting preventive care can go a long way to promote health and wellness. Talk with your health care provider about what schedule of regular examinations is right for you. This is a good chance for you to check in with your provider about disease prevention and staying healthy. In between checkups, there are plenty of things you can do on your own. Experts have done a lot of research about which lifestyle changes and preventive measures are most likely to keep you healthy. Ask your health care provider for more information. Weight and diet Eat a healthy diet  Be sure to include plenty of vegetables, fruits, low-fat dairy products, and lean protein.  Do not eat a lot of foods high in solid fats, added sugars, or salt.  Get regular exercise. This is one of the most important things you can do for your health. ? Most adults should exercise for at least 150 minutes each week. The exercise should increase your heart rate and make you sweat (moderate-intensity exercise). ? Most adults should also do strengthening exercises at least twice a week. This is in addition to the moderate-intensity exercise. Maintain a healthy weight  Body mass index (BMI) is a measurement that can be used to identify possible weight problems. It estimates body fat based on height and weight. Your health care provider can help determine your BMI and help you achieve or maintain a healthy weight.  For females 5 years of age and older: ? A BMI below 18.5 is considered underweight. ? A BMI of 18.5 to 24.9 is normal. ? A BMI of 25 to 29.9 is considered overweight. ? A BMI of 30 and above is considered obese. Watch levels of cholesterol and blood lipids  You should start having your blood tested for lipids and cholesterol at 70 years of age, then have this test every 5 years.  You may need to have your cholesterol levels checked more often if: ? Your lipid or  cholesterol levels are high. ? You are older than 70 years of age. ? You are at high risk for heart disease. Cancer screening Lung Cancer  Lung cancer screening is recommended for adults 48-79 years old who are at high risk for lung cancer because of a history of smoking.  A yearly low-dose CT scan of the lungs is recommended for people who: ? Currently smoke. ? Have quit within the past 15 years. ? Have at least a 30-pack-year history of smoking. A pack year is smoking an average of one pack of cigarettes a day for 1 year.  Yearly screening should continue until it has been 15 years since you quit.  Yearly screening should stop if you develop a health problem that would prevent you from having lung cancer treatment. Breast Cancer  Practice breast self-awareness. This means understanding how your breasts normally appear and feel.  It also means doing regular breast self-exams. Let your health care provider know about any changes, no matter how small.  If you are in your 20s or 30s, you should have a clinical breast exam (CBE) by a health care provider every 1-3 years as part of a regular health exam.  If you are 22 or older, have a CBE every year. Also consider having a breast X-ray (mammogram) every year.  If you have a family history of breast cancer, talk to your health care provider about genetic screening.  If you are at high risk for breast cancer, talk  to your health care provider about having an MRI and a mammogram every year.  Breast cancer gene (BRCA) assessment is recommended for women who have family members with BRCA-related cancers. BRCA-related cancers include: ? Breast. ? Ovarian. ? Tubal. ? Peritoneal cancers.  Results of the assessment will determine the need for genetic counseling and BRCA1 and BRCA2 testing. Cervical Cancer Your health care provider may recommend that you be screened regularly for cancer of the pelvic organs (ovaries, uterus, and vagina).  This screening involves a pelvic examination, including checking for microscopic changes to the surface of your cervix (Pap test). You may be encouraged to have this screening done every 3 years, beginning at age 21.  For women ages 30-65, health care providers may recommend pelvic exams and Pap testing every 3 years, or they may recommend the Pap and pelvic exam, combined with testing for human papilloma virus (HPV), every 5 years. Some types of HPV increase your risk of cervical cancer. Testing for HPV may also be done on women of any age with unclear Pap test results.  Other health care providers may not recommend any screening for nonpregnant women who are considered low risk for pelvic cancer and who do not have symptoms. Ask your health care provider if a screening pelvic exam is right for you.  If you have had past treatment for cervical cancer or a condition that could lead to cancer, you need Pap tests and screening for cancer for at least 20 years after your treatment. If Pap tests have been discontinued, your risk factors (such as having a new sexual partner) need to be reassessed to determine if screening should resume. Some women have medical problems that increase the chance of getting cervical cancer. In these cases, your health care provider may recommend more frequent screening and Pap tests. Colorectal Cancer  This type of cancer can be detected and often prevented.  Routine colorectal cancer screening usually begins at 70 years of age and continues through 70 years of age.  Your health care provider may recommend screening at an earlier age if you have risk factors for colon cancer.  Your health care provider may also recommend using home test kits to check for hidden blood in the stool.  A small camera at the end of a tube can be used to examine your colon directly (sigmoidoscopy or colonoscopy). This is done to check for the earliest forms of colorectal cancer.  Routine  screening usually begins at age 50.  Direct examination of the colon should be repeated every 5-10 years through 70 years of age. However, you may need to be screened more often if early forms of precancerous polyps or small growths are found. Skin Cancer  Check your skin from head to toe regularly.  Tell your health care provider about any new moles or changes in moles, especially if there is a change in a mole's shape or color.  Also tell your health care provider if you have a mole that is larger than the size of a pencil eraser.  Always use sunscreen. Apply sunscreen liberally and repeatedly throughout the day.  Protect yourself by wearing long sleeves, pants, a wide-brimmed hat, and sunglasses whenever you are outside. Heart disease, diabetes, and high blood pressure  High blood pressure causes heart disease and increases the risk of stroke. High blood pressure is more likely to develop in: ? People who have blood pressure in the high end of the normal range (130-139/85-89 mm Hg). ? People   who are overweight or obese. ? People who are African American.  If you are 84-22 years of age, have your blood pressure checked every 3-5 years. If you are 67 years of age or older, have your blood pressure checked every year. You should have your blood pressure measured twice-once when you are at a hospital or clinic, and once when you are not at a hospital or clinic. Record the average of the two measurements. To check your blood pressure when you are not at a hospital or clinic, you can use: ? An automated blood pressure machine at a pharmacy. ? A home blood pressure monitor.  If you are between 52 years and 3 years old, ask your health care provider if you should take aspirin to prevent strokes.  Have regular diabetes screenings. This involves taking a blood sample to check your fasting blood sugar level. ? If you are at a normal weight and have a low risk for diabetes, have this test once  every three years after 70 years of age. ? If you are overweight and have a high risk for diabetes, consider being tested at a younger age or more often. Preventing infection Hepatitis B  If you have a higher risk for hepatitis B, you should be screened for this virus. You are considered at high risk for hepatitis B if: ? You were born in a country where hepatitis B is common. Ask your health care provider which countries are considered high risk. ? Your parents were born in a high-risk country, and you have not been immunized against hepatitis B (hepatitis B vaccine). ? You have HIV or AIDS. ? You use needles to inject street drugs. ? You live with someone who has hepatitis B. ? You have had sex with someone who has hepatitis B. ? You get hemodialysis treatment. ? You take certain medicines for conditions, including cancer, organ transplantation, and autoimmune conditions. Hepatitis C  Blood testing is recommended for: ? Everyone born from [birth year] through 1967. ? Anyone with known risk factors for hepatitis C. Sexually transmitted infections (STIs)  You should be screened for sexually transmitted infections (STIs) including gonorrhea and chlamydia if: ? You are sexually active and are younger than 70 years of age. ? You are older than 70 years of age and your health care provider tells you that you are at risk for this type of infection. ? Your sexual activity has changed since you were last screened and you are at an increased risk for chlamydia or gonorrhea. Ask your health care provider if you are at risk.  If you do not have HIV, but are at risk, it may be recommended that you take a prescription medicine daily to prevent HIV infection. This is called pre-exposure prophylaxis (PrEP). You are considered at risk if: ? You are sexually active and do not regularly use condoms or know the HIV status of your partner(s). ? You take drugs by injection. ? You are sexually active with a partner  who has HIV. Talk with your health care provider about whether you are at high risk of being infected with HIV. If you choose to begin PrEP, you should first be tested for HIV. You should then be tested every 3 months for as long as you are taking PrEP. Pregnancy  If you are premenopausal and you may become pregnant, ask your health care provider about preconception counseling.  If you may become pregnant, take 400 to 800 micrograms (mcg) of folic acid every  day.  If you want to prevent pregnancy, talk to your health care provider about birth control (contraception). Osteoporosis and menopause  Osteoporosis is a disease in which the bones lose minerals and strength with aging. This can result in serious bone fractures. Your risk for osteoporosis can be identified using a bone density scan.  If you are 25 years of age or older, or if you are at risk for osteoporosis and fractures, ask your health care provider if you should be screened.  Ask your health care provider whether you should take a calcium or vitamin D supplement to lower your risk for osteoporosis.  Menopause may have certain physical symptoms and risks.  Hormone replacement therapy may reduce some of these symptoms and risks. Talk to your health care provider about whether hormone replacement therapy is right for you. Follow these instructions at home:  Schedule regular health, dental, and eye exams.  Stay current with your immunizations.  Do not use any tobacco products including cigarettes, chewing tobacco, or electronic cigarettes.  If you are pregnant, do not drink alcohol.  If you are breastfeeding, limit how much and how often you drink alcohol.  Limit alcohol intake to no more than 1 drink per day for nonpregnant women. One drink equals 12 ounces of beer, 5 ounces of wine, or 1 ounces of hard liquor.  Do not use street drugs.  Do not share needles.  Ask your health care provider for help if you need support  or information about quitting drugs.  Tell your health care provider if you often feel depressed.  Tell your health care provider if you have ever been abused or do not feel safe at home. This information is not intended to replace advice given to you by your health care provider. Make sure you discuss any questions you have with your health care provider. Document Released: 06/27/2011 Document Revised: 05/19/2016 Document Reviewed: 09/15/2015 Elsevier Interactive Patient Education  2019 [employer].  Consider getting the new shingles vaccine called Shingrix; that is available for individuals 72 years of age and older, and is recommended even if you have had shingles in the past and/or already received the old shingles vaccine (Zostavax); it is a two-part series, and is available at many local pharmacies  Check out the information at familydoctor.org entitled "Nutrition for Weight Loss: What You Need to Know about Fad Diets" Try to lose between 1-2 pounds per week by taking in fewer calories and burning off more calories You can succeed by limiting portions, limiting foods dense in calories and fat, becoming more active, and drinking 8 glasses of water a day (64 ounces) Don't skip meals, especially breakfast, as skipping meals may alter your metabolism Do not use over-the-counter weight loss pills or gimmicks that claim rapid weight loss A healthy BMI (or body mass index) is between 18.5 and 24.9 You can calculate your ideal BMI at the [employer] website ClubMonetize.fr  Obesity, Adult Obesity is the condition of having too much total body fat. Being overweight or obese means that your weight is greater than what is considered healthy for your body size. Obesity is determined by a measurement called BMI. BMI is an estimate of body fat and is calculated from height and weight. For adults, a BMI of 30 or higher is considered obese. Obesity can  eventually lead to other health concerns and major illnesses, including:  Stroke.  Coronary artery disease (CAD).  Type 2 diabetes.  Some types of cancer, including cancers of the  colon, breast, uterus, and gallbladder.  Osteoarthritis.  High blood pressure (hypertension).  High cholesterol.  Sleep apnea.  Gallbladder stones.  Infertility problems. What are the causes? The main cause of obesity is taking in (consuming) more calories than your body uses for energy. Other factors that contribute to this condition may include:  Being born with genes that make you more likely to become obese.  Having a medical condition that causes obesity. These conditions include: ? Hypothyroidism. ? Polycystic ovarian syndrome (PCOS). ? Binge-eating disorder. ? Cushing syndrome.  Taking certain medicines, such as steroids, antidepressants, and seizure medicines.  Not being physically active (sedentary lifestyle).  Living where there are limited places to exercise safely or buy healthy foods.  Not getting enough sleep. What increases the risk? The following factors may increase your risk of this condition:  Having a family history of obesity.  Being a woman of African-American descent.  Being a man of Hispanic descent. What are the signs or symptoms? Having excessive body fat is the main symptom of this condition. How is this diagnosed? This condition may be diagnosed based on:  Your symptoms.  Your medical history.  A physical exam. Your health care provider may measure: ? Your BMI. If you are an adult with a BMI between 25 and less than 30, you are considered overweight. If you are an adult with a BMI of 30 or higher, you are considered obese. ? The distances around your hips and your waist (circumferences). These may be compared to each other to help diagnose your condition. ? Your skinfold thickness. Your health care provider may gently pinch a fold of your skin and  measure it. How is this treated? Treatment for this condition often includes changing your lifestyle. Treatment may include some or all of the following:  Dietary changes. Work with your health care provider and a dietitian to set a weight-loss goal that is healthy and reasonable for you. Dietary changes may include eating: ? Smaller portions. A portion size is the amount of a particular food that is healthy for you to eat at one time. This varies from person to person. ? Low-calorie or low-fat options. ? More whole grains, fruits, and vegetables.  Regular physical activity. This may include aerobic activity (cardio) and strength training.  Medicine to help you lose weight. Your health care provider may prescribe medicine if you are unable to lose 1 pound a week after 6 weeks of eating more healthily and doing more physical activity.  Surgery. Surgical options may include gastric banding and gastric bypass. Surgery may be done if: ? Other treatments have not helped to improve your condition. ? You have a BMI of 40 or higher. ? You have life-threatening health problems related to obesity. Follow these instructions at home:  Eating and drinking   Follow recommendations from your health care provider about what you eat and drink. Your health care provider may advise you to: ? Limit fast foods, sweets, and processed snack foods. ? Choose low-fat options, such as low-fat milk instead of whole milk. ? Eat 5 or more servings of fruits or vegetables every day. ? Eat at home more often. This gives you more control over what you eat. ? Choose healthy foods when you eat out. ? Learn what a healthy portion size is. ? Keep low-fat snacks on hand. ? Avoid sugary drinks, such as soda, fruit juice, iced tea sweetened with sugar, and flavored milk. ? Eat a healthy breakfast.  Drink enough water  to keep your urine clear or pale yellow.  Do not go without eating for long periods of time (do not fast)  or follow a fad diet. Fasting and fad diets can be unhealthy and even dangerous. Physical Activity  Exercise regularly, as told by your health care provider. Ask your health care provider what types of exercise are safe for you and how often you should exercise.  Warm up and stretch before being active.  Cool down and stretch after being active.  Rest between periods of activity. Lifestyle  Limit the time that you spend in front of your TV, computer, or video game system.  Find ways to reward yourself that do not involve food.  Limit alcohol intake to no more than 1 drink a day for nonpregnant women and 2 drinks a day for men. One drink equals 12 oz of beer, 5 oz of wine, or 1 oz of hard liquor. General instructions  Keep a weight loss journal to keep track of the food you eat and how much you exercise you get.  Take over-the-counter and prescription medicines only as told by your health care provider.  Take vitamins and supplements only as told by your health care provider.  Consider joining a support group. Your health care provider may be able to recommend a support group.  Keep all follow-up visits as told by your health care provider. This is important. Contact a health care provider if:  You are unable to meet your weight loss goal after 6 weeks of dietary and lifestyle changes. This information is not intended to replace advice given to you by your health care provider. Make sure you discuss any questions you have with your health care provider. Document Released: 01/19/2005 Document Revised: 05/16/2016 Document Reviewed: 09/30/2015 Elsevier Interactive Patient Education  2019 [employer]  Preventing Unhealthy [employer], Adult Staying at a healthy weight is important to your overall health. When fat builds up in your body, you may become overweight or obese. Being overweight or obese increases your risk of developing certain health problems, such as heart disease,  diabetes, sleeping problems, joint problems, and some types of cancer. Unhealthy weight gain is often the result of making unhealthy food choices or not getting enough exercise. You can make changes to your lifestyle to prevent obesity and stay as healthy as possible. What nutrition changes can be made?   Eat only as much as your body needs. To do this: ? Pay attention to signs that you are hungry or full. Stop eating as soon as you feel full. ? If you feel hungry, try drinking water first before eating. Drink enough water so your urine is clear or pale yellow. ? Eat smaller portions. Pay attention to portion sizes when eating out. ? Look at serving sizes on food labels. Most foods contain more than one serving per container. ? Eat the recommended number of calories for your gender and activity level. For most active people, a daily total of 2,000 calories is appropriate. If you are trying to lose weight or are not very active, you may need to eat fewer calories. Talk with your health care provider or a diet and nutrition specialist (dietitian) about how many calories you need each day.  Choose healthy foods, such as: ? Fruits and vegetables. At each meal, try to fill at least half of your plate with fruits and vegetables. ? Whole grains, such as whole-wheat bread, brown rice, and quinoa. ? Lean meats, such as chicken or  fish. ? Other healthy proteins, such as beans, eggs, or tofu. ? Healthy fats, such as nuts, seeds, fatty fish, and olive oil. ? Low-fat or fat-free dairy products.  Check food labels, and avoid food and drinks that: ? Are high in calories. ? Have added sugar. ? Are high in sodium. ? Have saturated fats or trans fats.  Cook foods in healthier ways, such as by baking, broiling, or grilling.  Make a meal plan for the week, and shop with a grocery list to help you stay on track with your purchases. Try to avoid going to the grocery store when you are hungry.  When grocery  shopping, try to shop around the outside of the store first, where the fresh foods are. Doing this helps you to avoid prepackaged foods, which can be high in sugar, salt (sodium), and fat. What lifestyle changes can be made?   Exercise for 30 or more minutes on 5 or more days each week. Exercising may include brisk walking, yard work, biking, running, swimming, and team sports like basketball and soccer. Ask your health care provider which exercises are safe for you.  Do muscle-strengthening activities, such as lifting weights or using resistance bands, on 2 or more days a week.  Do not use any products that contain nicotine or tobacco, such as cigarettes and e-cigarettes. If you need help quitting, ask your health care provider.  Limit alcohol intake to no more than 1 drink a day for nonpregnant women and 2 drinks a day for men. One drink equals 12 oz of beer, 5 oz of wine, or 1 oz of hard liquor.  Try to get 7-9 hours of sleep each night. What other changes can be made?  Keep a food and activity journal to keep track of: ? What you ate and how many calories you had. Remember to count the calories in sauces, dressings, and side dishes. ? Whether you were active, and what exercises you did. ? Your calorie, weight, and activity goals.  Check your weight regularly. Track any changes. If you notice you have gained weight, make changes to your diet or activity routine.  Avoid taking weight-loss medicines or supplements. Talk to your health care provider before starting any new medicine or supplement.  Talk to your health care provider before trying any new diet or exercise plan. Why are these changes important? Eating healthy, staying active, and having healthy habits can help you to prevent obesity. Those changes also:  Help you manage stress and emotions.  Help you connect with friends and family.  Improve your self-esteem.  Improve your sleep.  Prevent long-term health  problems. What can happen if changes are not made? Being obese or overweight can cause you to develop joint or bone problems, which can make it hard for you to stay active or do activities you enjoy. Being obese or overweight also puts stress on your heart and lungs and can lead to health problems like diabetes, heart disease, and some cancers. Where to find more information Talk with your health care provider or a dietitian about healthy eating and healthy lifestyle choices. You may also find information from:  [place] Department of Agriculture, MyPlate: FormerBoss.no  American Heart Association: www.heart.org  Centers for Disease Control and Prevention: http://www.wolf.info/ Summary  Staying at a healthy weight is important to your overall health. It helps you to prevent certain diseases and health problems, such as heart disease, diabetes, joint problems, sleep disorders, and some types of cancer.  Being  obese or overweight can cause you to develop joint or bone problems, which can make it hard for you to stay active or do activities you enjoy.  You can prevent unhealthy weight gain by eating a healthy diet, exercising regularly, not smoking, limiting alcohol, and getting enough sleep.  Talk with your health care provider or a dietitian for guidance about healthy eating and healthy lifestyle choices. This information is not intended to replace advice given to you by your health care provider. Make sure you discuss any questions you have with your health care provider. Document Released: 12/13/2016 Document Revised: 09/22/2017 Document Reviewed: 01/18/2017 Elsevier Interactive Patient Education  2019 [employer].

## 2019-01-24 NOTE — Progress Notes (Signed)
  Oncology Nurse Navigator Documentation  Navigator Location: [facility] (01/24/19 0800)   )Navigator Encounter Type: Telephone (01/24/19 0800) Telephone: Stacy Warren Call (01/24/19 0800)                       Barriers/Navigation Needs: Education;Coordination of Care (01/24/19 0800)   Interventions: Education (01/24/19 0800)                      Time Spent with Patient: 30 (01/24/19 0800)   Called patient per Dr. Collie Siad request to review her Oncotype results.  Reviewed low Oncotype recurrence score and because of that no chemotherapy is recommended.  States she had seen Dr. [name] today for her radiation oncology consult and did mention that to her.  States she has an appointment next week at [facility] for a second opinion.  She will let me know her plan.  She is to call if she has any questions or needs.

## 2019-02-05 ENCOUNTER — Ambulatory Visit: Payer: Medicare Other

## 2019-02-13 ENCOUNTER — Encounter: Payer: Self-pay | Admitting: Oncology

## 2019-02-26 ENCOUNTER — Ambulatory Visit: Payer: Medicare Other | Admitting: Obstetrics and Gynecology

## 2019-04-01 ENCOUNTER — Encounter: Payer: Self-pay | Admitting: *Deleted

## 2019-04-01 NOTE — Progress Notes (Signed)
Called patient and left her a message to return my call.  Per chart review I cannot see that she has returned for radiation therapy or had a second opinion at [facility] per our last conversation.

## 2019-04-19 IMAGING — MG DIGITAL DIAGNOSTIC UNILATERAL RIGHT MAMMOGRAM WITH TOMO AND CAD
6 series · 6 of 18 positions shown · non-contrast
Comparison: Previous exam(s).

CLINICAL DATA: 67-year-old female for further evaluation of
possible RIGHT breast mass on screening mammogram

EXAM:
DIGITAL DIAGNOSTIC RIGHT MAMMOGRAM WITH TOMO
ULTRASOUND RIGHT BREAST

[R MLO synth-2D (1 of 2)]
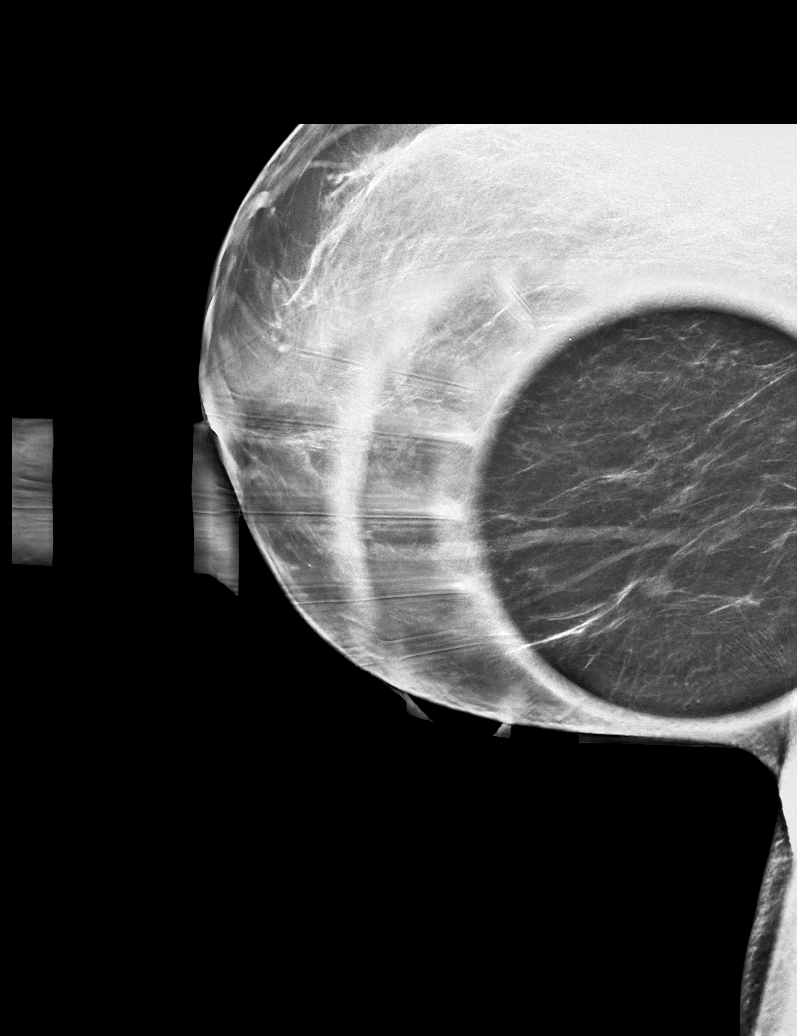

[R MLO synth-2D (2 of 2)]
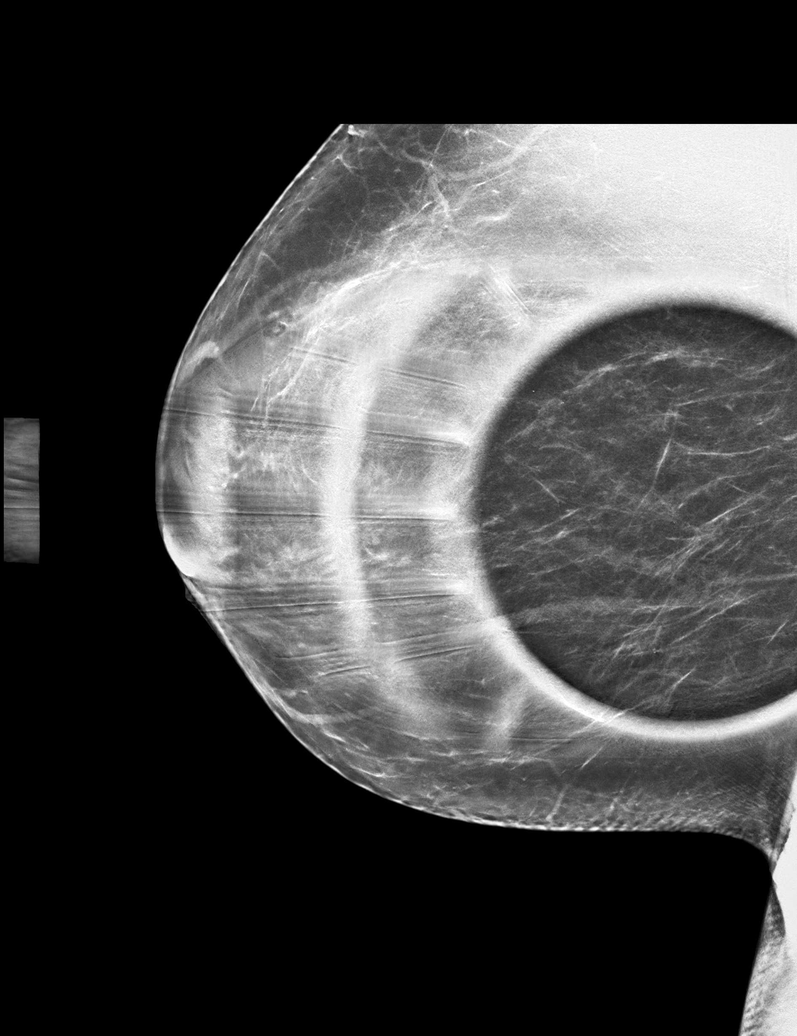

[R CC synth-2D]
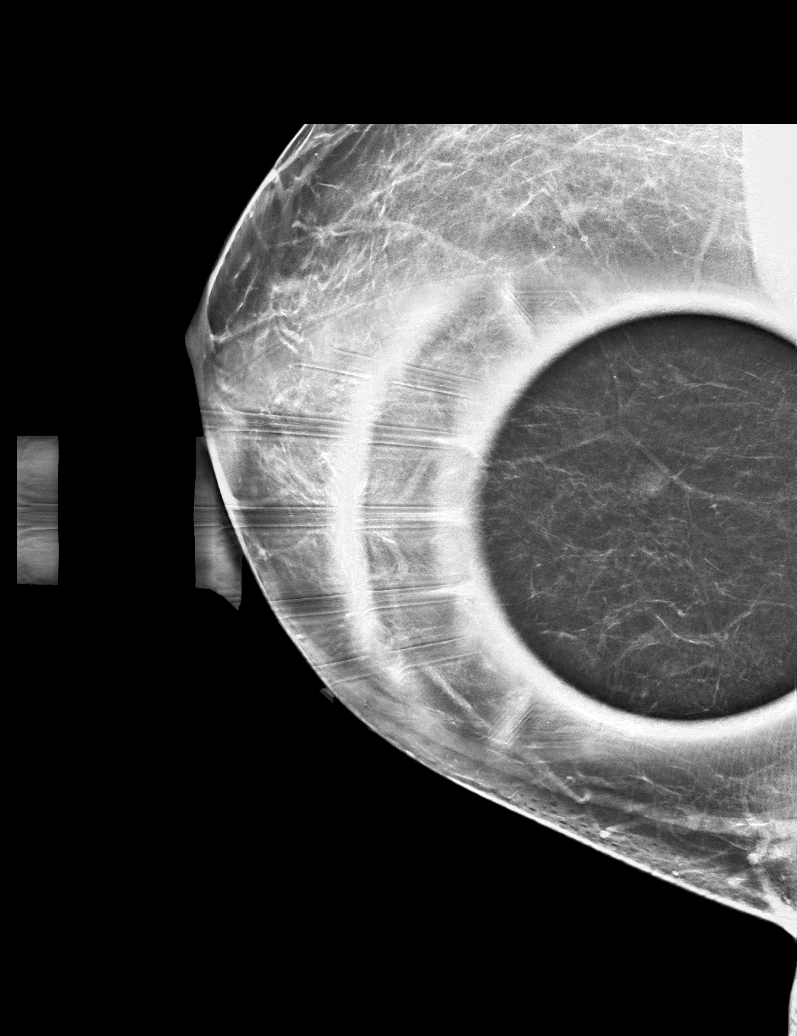

[R CC tomo · tomo slice 29/57.0]
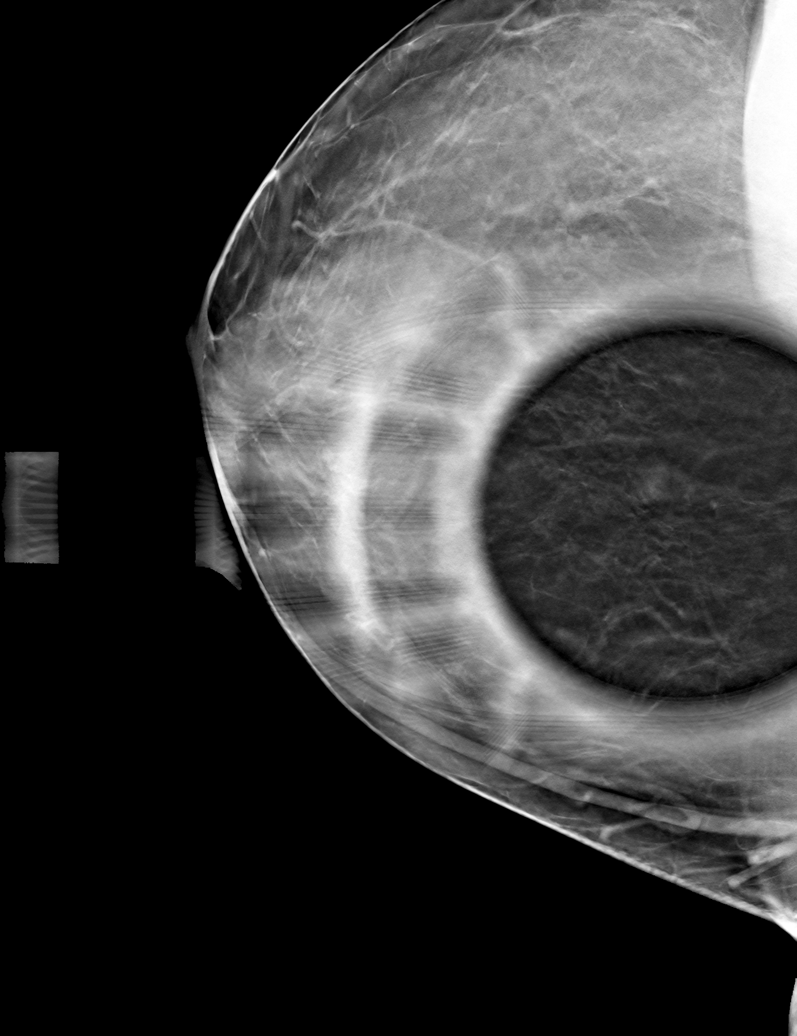

[R MLO tomo (1 of 2) · tomo slice 35/69.0]
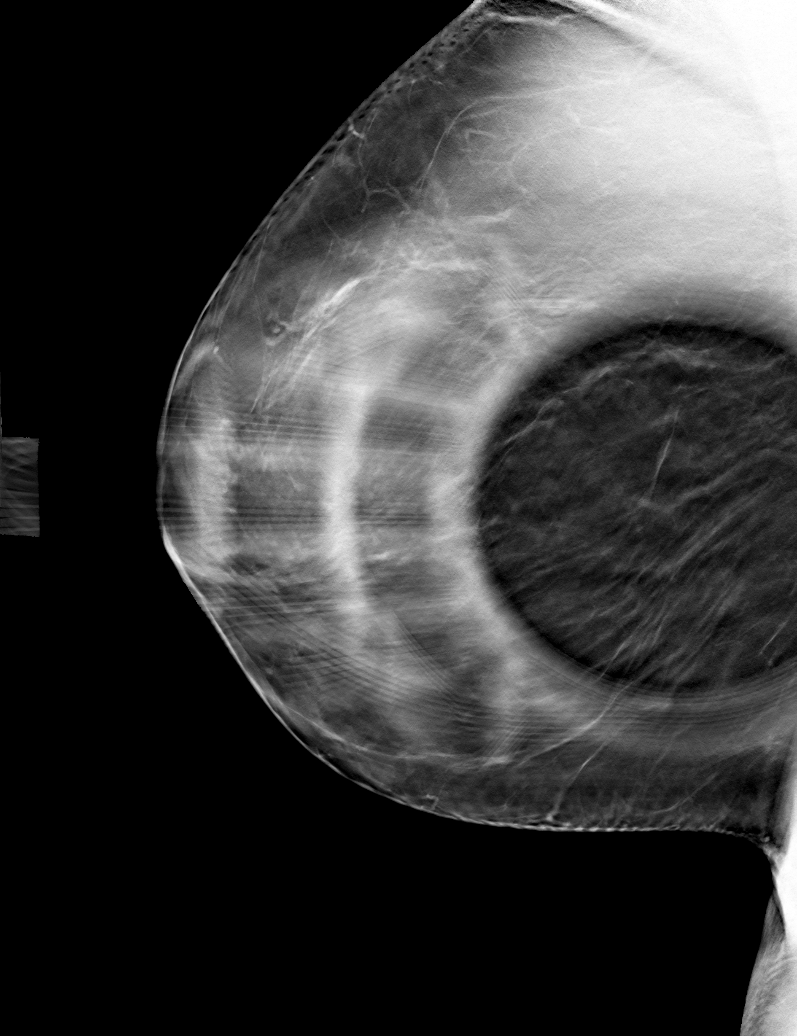

[R MLO tomo (2 of 2) · tomo slice 31/61.0]
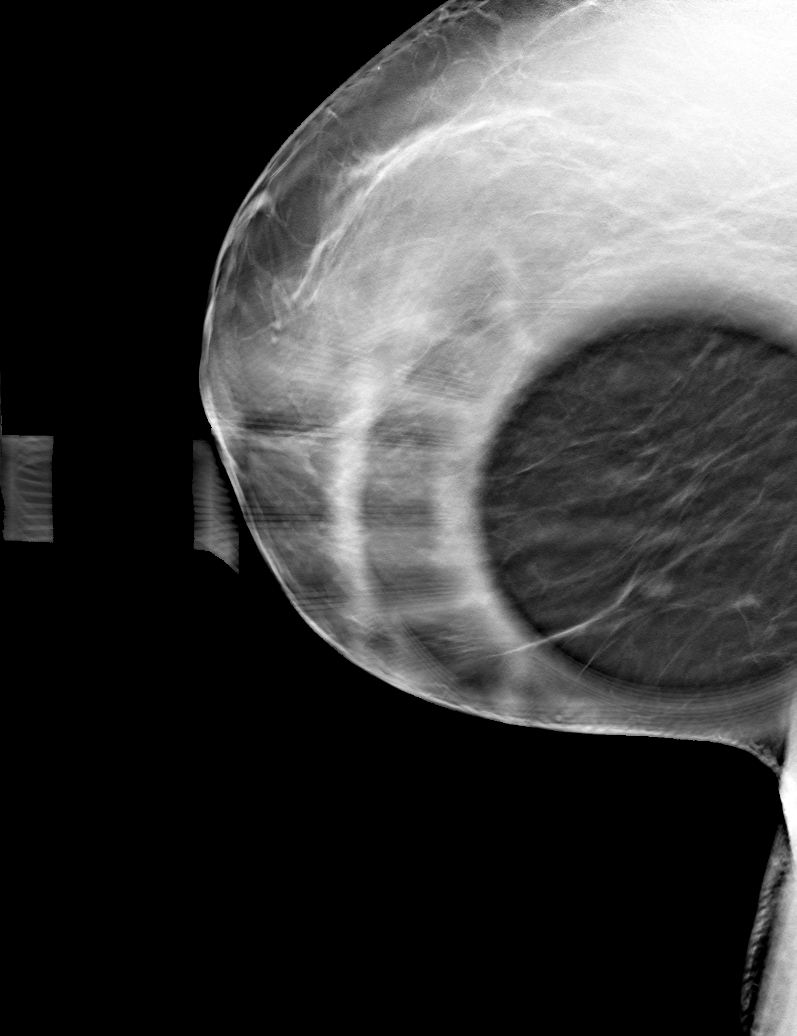

[6 of 18 positions shown; findings below may reference images not displayed]

ACR Breast Density Category b: There are scattered areas of
fibroglandular density.
FINDINGS: 2D/3D spot compression views of the RIGHT breast demonstrate a
persistent 7 mm partially circumscribed partially obscured oval mass
within the slightly INNER probably LOWER RIGHT breast.

Targeted ultrasound is performed, showing no solid or cystic mass,
distortion or abnormal shadowing within the entire LOWER and central
RIGHT breast. Heterogeneous tissue throughout the RIGHT breast
noted.
IMPRESSION: 1. Persistent 7 mm mass within the slightly INNER probable LOWER
RIGHT breast without sonographic correlate. Given that this is new
since 0100 in this postmenopausal patient, tissue sampling is
recommended.

RECOMMENDATION:
3D/stereotactic guided RIGHT breast biopsy.

I have discussed the findings and recommendations with the patient.
Results were also provided in writing at the conclusion of the
visit. If applicable, a reminder letter will be sent to the patient
regarding the next appointment.

BI-RADS CATEGORY  4: Suspicious.

## 2019-05-13 IMAGING — MG BREAST SURGICAL SPECIMEN
1 series · 1 of 1 positions shown · non-contrast
Comparison: Previous exam(s).

CLINICAL DATA: Post right breast lumpectomy.

EXAM:
SPECIMEN RADIOGRAPH OF THE RIGHT BREAST

[R SPECIMEN]
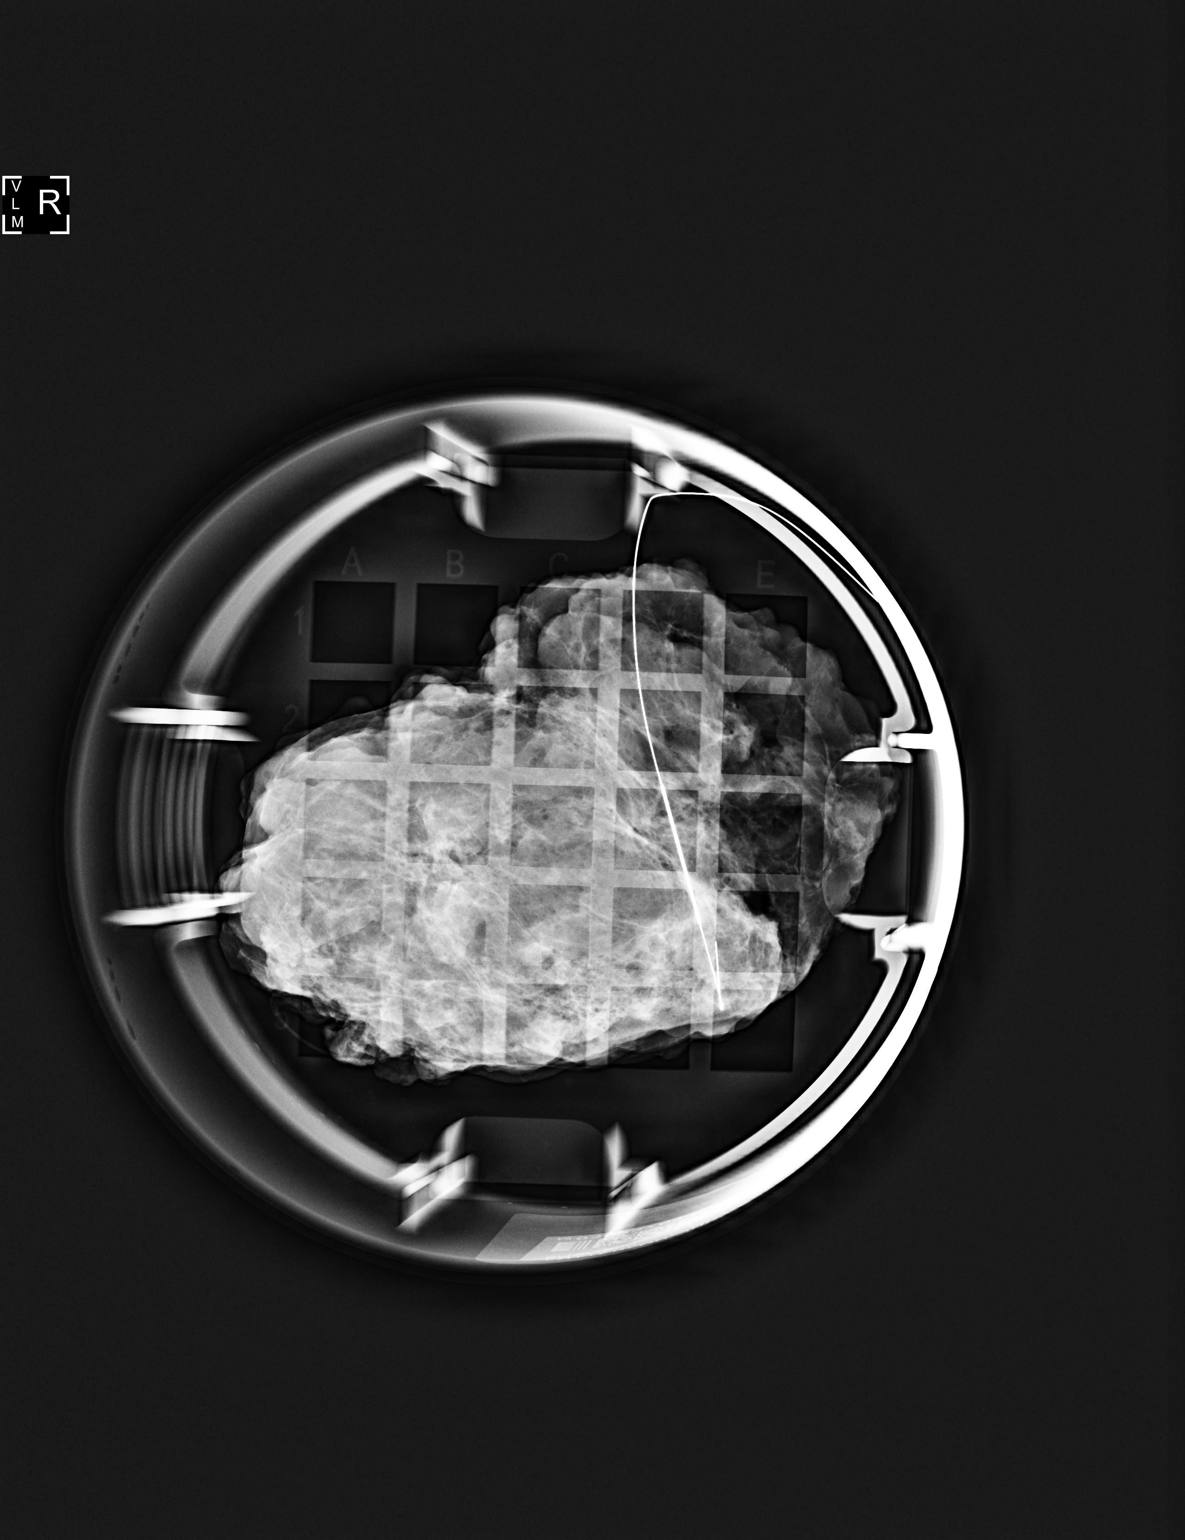

[1 of 1 positions shown; findings below may reference images not displayed]

FINDINGS: Status post excision of the right breast. The wire tip is present.
The coil shaped post biopsy marker is not present in the surgical
specimen however given the 4.5 cm lateral migration reported after
the stereotactic biopsy, it was not expected to be within the
specimen.
IMPRESSION: Specimen radiograph of the right breast.

## 2019-05-30 ENCOUNTER — Encounter: Payer: Self-pay | Admitting: Oncology

## 2019-06-18 ENCOUNTER — Ambulatory Visit: Payer: Medicare Other | Admitting: Nurse Practitioner

## 2019-06-18 ENCOUNTER — Encounter: Payer: Self-pay | Admitting: Nurse Practitioner

## 2019-06-18 ENCOUNTER — Other Ambulatory Visit (HOSPITAL_COMMUNITY)
Admission: RE | Admit: 2019-06-18 | Discharge: 2019-06-18 | Disposition: A | Discharge status: Home / Self Care | Payer: Medicare Other | Source: Ambulatory Visit | Attending: Nurse Practitioner | Admitting: Nurse Practitioner

## 2019-06-18 ENCOUNTER — Other Ambulatory Visit: Payer: Self-pay

## 2019-06-18 VITALS — BP 136/70 | HR 92 | Temp 98.0°F | Resp 14 | Ht 64.0 in | Wt 206.3 lb

## 2019-06-18 DIAGNOSIS — N898 Other specified noninflammatory disorders of vagina: Secondary | ICD-10-CM | POA: Insufficient documentation

## 2019-06-18 DIAGNOSIS — R350 Frequency of micturition: Secondary | ICD-10-CM

## 2019-06-18 DIAGNOSIS — N3090 Cystitis, unspecified without hematuria: Secondary | ICD-10-CM | POA: Diagnosis not present

## 2019-06-18 DIAGNOSIS — R109 Unspecified abdominal pain: Secondary | ICD-10-CM | POA: Diagnosis not present

## 2019-06-18 LAB — POCT URINALYSIS DIPSTICK
Bilirubin, UA: NEGATIVE
Blood, UA: NEGATIVE
Glucose, UA: NEGATIVE
Ketones, UA: NEGATIVE
Nitrite, UA: NEGATIVE
Protein, UA: NEGATIVE
Spec Grav, UA: 1.02 (ref 1.010–1.025)
Urobilinogen, UA: NEGATIVE E.U./dL — AB
pH, UA: 5 (ref 5.0–8.0)

## 2019-06-18 MED ORDER — SULFAMETHOXAZOLE-TRIMETHOPRIM 800-160 MG PO TABS: 1.0000 | ORAL_TABLET | Freq: Two times a day (BID) | ORAL | 0 refills | Status: DC

## 2019-06-18 NOTE — Patient Instructions (Addendum)
-   Please call you pharmacy to ask about your pneumonia shot and you can call here to schedule a nurse visit if you needed.   Please take your antibiotic as prescribed with food on your stomach to prevent nausea and upset stomach. Take the antibiotic for the entire course, even if your symptoms resolve before the course if completed. Using antibiotics inappropriately can make it harder to treat future infections. If you have any concerning side effects please stop the medication and let us know immediately. I do recommend taking a probiotic to replenish your good gut health anytime you take an antibiotic. You can get probiotics in types of yogurt like activia, or other food/drinks such as kimchi, kombucha , sauerkraut, or you can take an over the counter supplement.   Your symptoms should begin to improve within a day of starting antibiotics. But you should finish all the antibiotic pills you get to ensure the bacteria is killed and prevent antibiotic resistance.  If you are having pain when you pee, you can also take a medicine to numb your bladder. Look for Phenazopyridine (Pyridium or AZO) over the counter at your pharmacy. This medicine eases the pain caused by urinary tract infections. It also reduces the need to urinate.  If you frequently get UTI's here are some prevention tips:  - Avoiding spermicides  - Drinking more fluid - This can help prevent bladder infections. ?Urinating right after sex - Some doctors think this helps, because it helps flush out germs that might get into the bladder during sex. There is no proof it works, but it also cannot hurt. ?Vaginal estrogen - If you are a woman who has already been through menopause, your doctor might suggest this. Vaginal estrogen comes in a cream or a flexible ring that you put into your vagina. It can help prevent bladder infections. ?Antibiotics - If you get a lot of bladder infections, and the above methods have not helped, your doctor might  give you antibiotics to help prevent infection. But taking antibiotics has downsides, so doctors usually suggest trying other things first   The studies suggesting that cranberry products prevent bladder infections are not very good. Other studies suggest that cranberry products do not prevent bladder infections. But if you want to try cranberry products for this purpose, there is probably not much harm in doing so.

## 2019-06-18 NOTE — Progress Notes (Signed)
Name: Stacy Warren   MRN: [id number]    DOB: [birth year]   Date:06/18/2019       Progress Note  Subjective  Chief Complaint  Chief Complaint  Patient presents with  . Urinary Tract Infection    onset last week symptoms include abdominal pain, frequency  . Vaginal Discharge    with itching    HPI  UTI Symptoms Symptoms have been ongoing for 7 days.  Patient endorses urinary frequency and urgency and hesitancy , suprapubic pain. Patient denies fevers, chills, flank pain, CVA tenderness, n/v, fatigue. Patient endorses  vaginal discharge- yellow, denies itching or pain.  Patient is not sexually active. Denies recent antibiotic use. Has not douched regularly.    PHQ2/9: Depression screen [facility] 2/9 06/18/2019 01/23/2019 11/16/2018 09/24/2018 04/09/2018  Decreased Interest 0 0 0 0 0  Down, Depressed, Hopeless 0 0 0 0 0  PHQ - 2 Score 0 0 0 0 0  Altered sleeping 0 0 0 0 -  Tired, decreased energy 0 0 0 0 -  Change in appetite 0 0 0 0 -  Feeling bad or failure about yourself  0 0 0 0 -  Trouble concentrating 0 0 0 0 -  Moving slowly or fidgety/restless 0 0 0 0 -  Suicidal thoughts 0 0 0 0 -  PHQ-9 Score 0 0 0 0 -  Difficult doing work/chores Not difficult at all Not difficult at all Not difficult at all Not difficult at all -     PHQ reviewed. Negative  Patient Active Problem List   Diagnosis Date Noted  . Obesity (BMI 35.0-39.9 without comorbidity) 01/23/2019  . Routine general medical examination at a health care facility 01/23/2019  . Malignant neoplasm of lower-inner quadrant of right breast of female, estrogen receptor positive ([place]) 12/22/2018  . Osteopenia 11/28/2018  . Acute cystitis 10/16/2018  . Osteoarthritis 09/24/2018  . 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 Urinary, incontinence, stress female 04/09/2018  . Family history of breast cancer 02/22/2017  . History of pulmonary embolism 12/02/2015    Past Medical History:  Diagnosis Date  .  Arthritis   . Osteopenia 11/28/2018  . Pulmonary embolism ([place]) 04/09/2018   Completed course of blood thinner in 2007; after femur fracture and surgery    Past Surgical History:  Procedure Laterality Date  . BREAST BIOPSY Right 12/13/2018   stereo bx of mass, coil clip INVASIVE MAMMARY CARCINOMA  . BREAST LUMPECTOMY Right 12/31/2018   [place]  . BREAST LUMPECTOMY WITH NEEDLE LOCALIZATION AND AXILLARY SENTINEL LYMPH NODE BX Right 12/31/2018   Procedure: BREAST LUMPECTOMY WITH SENTINEL LYMPH NODE BX, WIRE LOCALIZATION;  Surgeon: Vickie Epley, MD;  Location: [facility] ORS;  Service: General;  Laterality: Right;  . bunionectomy Left   . CERVICAL CONE BIOPSY  1984  . DILATION AND CURETTAGE OF UTERUS  06/07/2018  . HIP PINNING  2007  . HYSTEROSCOPY W/D&C N/A 06/07/2018   Procedure: DILATATION AND CURETTAGE /HYSTEROSCOPY/MYOSURE POLYPECTOMY;  Surgeon: Malachy Mood, MD;  Location: [facility] ORS;  Service: Gynecology;  Laterality: N/A;  . LEG SURGERY  2007   plate  . TONSILLECTOMY    . VAGINAL DELIVERY     x2    Social History   Tobacco Use  . Smoking status: Never Smoker  . Smokeless tobacco: Never Used  Substance Use Topics  . Alcohol use: No     Current Outpatient Medications:  .  acetaminophen (TYLENOL) 650 MG CR tablet, Take 650 mg by  mouth every 8 (eight) hours as needed for pain., Disp: , Rfl:  .  anastrozole (ARIMIDEX) 1 MG tablet, Take 1 mg by mouth daily., Disp: , Rfl:  .  aspirin EC 81 MG tablet, Take 81 mg by mouth daily., Disp: , Rfl:  .  Calcium-Magnesium-Vitamin D (CALCIUM 1200+D3 PO), Take 1 tablet by mouth 2 (two) times a week., Disp: , Rfl:  .  Cholecalciferol (VITAMIN D3) 1000 units CAPS, Take 1,000 Units by mouth daily., Disp: , Rfl:  .  fexofenadine (ALLEGRA) 180 MG tablet, Take 180 mg by mouth daily., Disp: , Rfl:  .  glucosamine-chondroitin 500-400 MG tablet, Take 1 tablet by mouth daily. , Disp: , Rfl:  .  ibuprofen (ADVIL,MOTRIN) 400 MG tablet, Take 1 tablet (400  mg total) by mouth every 6 (six) hours as needed., Disp: 30 tablet, Rfl: 0 .  Multiple Vitamins-Minerals (CENTRUM SILVER PO), Take 1 tablet by mouth daily., Disp: , Rfl:  .  Tetrahydrozoline HCl (VISINE OP), Place 2 drops into both eyes daily as needed (for dry eyes)., Disp: , Rfl:  .  TURMERIC CURCUMIN PO, Take 500 mg by mouth at bedtime. , Disp: , Rfl:  .  vitamin B-12 (CYANOCOBALAMIN) 1000 MCG tablet, Take 1,000 mcg by mouth 4 (four) times a week. , Disp: , Rfl:  .  cetirizine (ZYRTEC) 10 MG tablet, Take 10 mg by mouth daily as needed for allergies. , Disp: , Rfl:  .  Prasterone (INTRAROSA) 6.5 MG INST, Place 6.5 mg vaginally at bedtime. (Patient not taking: Reported on 01/10/2019), Disp: 30 each, Rfl: 11 .  sulfamethoxazole-trimethoprim (BACTRIM DS) 800-160 MG tablet, Take 1 tablet by mouth 2 (two) times daily., Disp: 10 tablet, Rfl: 0  No Known Allergies  ROS    No other specific complaints in a complete review of systems (except as listed in HPI above).  Objective  Vitals:   06/18/19 0932  Height: 5\' 4"  (1.626 m)     Body mass index is 36.12 kg/m.  Nursing Note and Vital Signs reviewed.  Physical Exam  Constitutional: Patient appears well-developed and well-nourished. Obese. No distress.  HEENT: head atraumatic, normocephalic, conjunctive clear Cardiovascular: Normal rate,  No BLE edema. Pulmonary/Chest: Effort normall. No respiratory distress. Skin: no concerning rashes or bruising  Psychiatric: Patient has a normal mood and affect. behavior is normal. Judgment and thought content normal.   Hands off exam due to pandemic and no acute concerns.    Results for orders placed or performed in visit on 06/18/19 (from the past 48 hour(s))  POCT urinalysis dipstick     Status: Abnormal   Collection Time: 06/18/19  9:43 AM  Result Value Ref Range   Color, UA dark yellow    Clarity, UA cloudy    Glucose, UA Negative Negative   Bilirubin, UA negative    Ketones, UA  negative    Spec Grav, UA 1.020 1.010 - 1.025   Blood, UA negative    pH, UA 5.0 5.0 - 8.0   Protein, UA Negative Negative   Urobilinogen, UA negative (A) 0.2 or 1.0 E.U./dL   Nitrite, UA negative    Leukocytes, UA Moderate (2+) (A) Negative   Appearance     Odor      Assessment & Plan 1. Vaginal discharge - Cervicovaginal ancillary only  2. Urinary frequency - POCT urinalysis dipstick  3. Cystitis - Urine Culture - sulfamethoxazole-trimethoprim (BACTRIM DS) 800-160 MG tablet; Take 1 tablet by mouth 2 (two) times daily.  Dispense: 10  tablet; Refill: 0

## 2019-06-19 LAB — CERVICOVAGINAL ANCILLARY ONLY
Bacterial vaginitis: NEGATIVE
Candida vaginitis: NEGATIVE

## 2019-06-19 LAB — URINE CULTURE
MICRO NUMBER:: 598760
SPECIMEN QUALITY:: ADEQUATE

## 2019-06-26 ENCOUNTER — Telehealth: Payer: Self-pay | Admitting: Nurse Practitioner

## 2019-06-26 NOTE — Telephone Encounter (Signed)
When her urine was cultured it did not show any concerning infection but normal vaginal bacteria. She can cut out caffeine and drink at least 64 ounces of water a day and take OTC pyridum as directed on box for no more than 3 days to see if that provides symptom relief or she can provide urinalysis micro that we can test ASAP

## 2019-06-26 NOTE — Telephone Encounter (Signed)
Left detailed voicemail to call and schedule an appt

## 2019-06-26 NOTE — Telephone Encounter (Signed)
Pt stated her lower back is hurting terribly. No pain with urination, some frequency. Stated she will come by today and leave a sample.

## 2019-06-26 NOTE — Telephone Encounter (Signed)
Patient saw Benjamine Mola on 6/23 due for a UTI.  Patient is still having symptoms, patient did say med did work some but she would like something else. She goes on vacation Saturday and would like to try and clear this up. Call back [phone]

## 2019-06-26 NOTE — Telephone Encounter (Signed)
If pain is terrible please have her make a visit sp we can assess this- I have availability tomorrow, she can still do urine today.

## 2019-06-27 ENCOUNTER — Telehealth: Payer: Self-pay

## 2019-06-27 NOTE — Telephone Encounter (Signed)
Copied from [place] [id number]. Topic: General - Other >> Jun 27, 2019 10:31 AM Mcneil, Ja-Kwan wrote: Reason for CRM: Pt stated she decided not to come in to give a specimen at this time. Pt stated she will just wait it out

## 2019-06-27 NOTE — Telephone Encounter (Signed)
Pt.notified

## 2019-06-27 NOTE — Telephone Encounter (Signed)
Please let her know if her symptoms are not improving or are worsening to please follow-up.

## 2019-08-01 ENCOUNTER — Telehealth: Payer: Self-pay

## 2019-08-01 NOTE — Telephone Encounter (Signed)
Pt calling triage today asking to speak with Maudie Mercury regarding appt Monday with AMS. shes requesting a pelvic exam when he does her pessary.

## 2019-08-01 NOTE — Telephone Encounter (Signed)
I spoke to pt. She thought she needed an annual exam and was wondering if it could all be done on Monday.  In looking at her chart, it looks like Dr. Sanda Klein did a physical in 12/2018. Pt aware her next one isn't due until 12/2019. Advised her unless she was having a Gyn issue, her insurance would not cover anything more than pessary check on Monday.She voices an understanding

## 2019-08-05 ENCOUNTER — Ambulatory Visit: Payer: Medicare Other | Admitting: Obstetrics and Gynecology

## 2019-08-16 ENCOUNTER — Other Ambulatory Visit: Payer: Self-pay

## 2019-08-16 ENCOUNTER — Encounter: Payer: Self-pay | Admitting: Obstetrics and Gynecology

## 2019-08-16 ENCOUNTER — Ambulatory Visit (INDEPENDENT_AMBULATORY_CARE_PROVIDER_SITE_OTHER): Payer: Medicare Other | Admitting: Obstetrics and Gynecology

## 2019-08-16 ENCOUNTER — Telehealth: Payer: Self-pay | Admitting: Obstetrics and Gynecology

## 2019-08-16 VITALS — BP 130/70 | Wt 211.0 lb

## 2019-08-16 DIAGNOSIS — Z4689 Encounter for fitting and adjustment of other specified devices: Secondary | ICD-10-CM | POA: Diagnosis not present

## 2019-08-16 MED ORDER — INTRAROSA 6.5 MG VA INST: 6.5000 mg | VAGINAL_INSERT | Freq: Every day | VAGINAL | 11 refills | Status: DC

## 2019-08-16 NOTE — Telephone Encounter (Signed)
Patient requesting cb from nurse, has a question regarding meds/cream.

## 2019-08-16 NOTE — Telephone Encounter (Signed)
Intrarosa rx has been called in this is not estrogen

## 2019-08-16 NOTE — Telephone Encounter (Signed)
Pt states does she need the vaginal cream Intrarosa? When she had her breat surgery in January, they had told her to stop it if it was estrogen. If she still needs a cream, can you call it in and make sure it is not Estrogen.

## 2019-08-16 NOTE — Progress Notes (Signed)
Obstetrics & Gynecology Office Visit   Chief Complaint:  Chief Complaint  Patient presents with  . Pessary Check    pessary fell out after surgery in January    History of Present Illness: Stacy Warren is a 68 y.o. female who is seen today for a follow-up appointment for a  pessary check. 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  She had expulsion of her pessary during a constipation episode postoperatively.  .  She presents today to have her 2 1/2 Gelhorn long stem pessary replaced.  Review of Systems: Review of Systems  Constitutional: Negative.   Gastrointestinal: Negative for abdominal pain.  Genitourinary: Negative.      Past Medical History:  Past Medical History:  Diagnosis Date  . Arthritis   . Osteopenia 11/28/2018  . Pulmonary embolism ([place]) 04/09/2018   Completed course of blood thinner in 2007; after femur fracture and surgery    Past Surgical History:  Past Surgical History:  Procedure Laterality Date  . BREAST BIOPSY Right 12/13/2018   stereo bx of mass, coil clip INVASIVE MAMMARY CARCINOMA  . BREAST LUMPECTOMY Right 12/31/2018   [place]  . BREAST LUMPECTOMY WITH NEEDLE LOCALIZATION AND AXILLARY SENTINEL LYMPH NODE BX Right 12/31/2018   Procedure: BREAST LUMPECTOMY WITH SENTINEL LYMPH NODE BX, WIRE LOCALIZATION;  Surgeon: Vickie Epley, MD;  Location: [facility] ORS;  Service: General;  Laterality: Right;  . bunionectomy Left   . CERVICAL CONE BIOPSY  1984  . DILATION AND CURETTAGE OF UTERUS  06/07/2018  . HIP PINNING  2007  . HYSTEROSCOPY W/D&C N/A 06/07/2018   Procedure: DILATATION AND CURETTAGE /HYSTEROSCOPY/MYOSURE POLYPECTOMY;  Surgeon: Malachy Mood, MD;  Location: [facility] ORS;  Service: Gynecology;  Laterality: N/A;  . LEG SURGERY  2007   plate  . TONSILLECTOMY    . VAGINAL DELIVERY     x2    Gynecologic History: No LMP recorded. Patient is  postmenopausal.  Obstetric History: [id number]  Family History:  Family History  Problem Relation Age of Onset  . Breast cancer Maternal Aunt 35       deceased 73s  . Breast cancer Maternal Aunt 35       deceased 82s  . Alzheimer's disease Mother        deceased 81  . Lymphoma Father 6       deceased 70  . Heart disease Father   . Skin cancer Brother        currently 20  . Thyroid disease Son   . Breast cancer Paternal Aunt        dx 52s; deceased 62s  . Colon cancer Neg Hx     Social History:  Social History   Socioeconomic History  . Marital status: Married    Spouse name: Alroy Dust  . Number of children: 2  . Years of education: 48  . Highest education level: Some college, no degree  Occupational History  . Occupation: retired  Scientific laboratory technician  . Financial resource strain: Not hard at all  . Food insecurity    Worry: Never true    Inability: Never true  . Transportation needs    Medical: No    Non-medical: No  Tobacco Use  . Smoking status: Never Smoker  . Smokeless tobacco: Never Used  Substance and Sexual Activity  . Alcohol use: No  . Drug use: No  . Sexual activity: Not Currently  Lifestyle  .  Physical activity    Days per week: 0 days    Minutes per session: 0 min  . Stress: Only a little  Relationships  . Social Herbalist on phone: Three times a week    Gets together: Three times a week    Attends religious service: More than 4 times per year    Active member of club or organization: No    Attends meetings of clubs or organizations: Never    Relationship status: Married  . Intimate partner violence    Fear of current or ex partner: No    Emotionally abused: No    Physically abused: No    Forced sexual activity: No  Other Topics Concern  . Not on file  Social History Narrative  . Not on file    Allergies:  No Known Allergies  Medications: Prior to Admission medications   Medication Sig Start Date End Date Taking? Authorizing  Provider  acetaminophen (TYLENOL) 650 MG CR tablet Take 650 mg by mouth every 8 (eight) hours as needed for pain.   Yes [provider]  anastrozole (ARIMIDEX) 1 MG tablet Take 1 mg by mouth daily.   Yes [provider]  Calcium-Magnesium-Vitamin D (CALCIUM 1200+D3 PO) Take 1 tablet by mouth 2 (two) times a week.   Yes [provider]  cetirizine (ZYRTEC) 10 MG tablet Take 10 mg by mouth daily as needed for allergies.    Yes [provider]  Cholecalciferol (VITAMIN D3) 1000 units CAPS Take 1,000 Units by mouth daily.   Yes [provider]  fexofenadine (ALLEGRA) 180 MG tablet Take 180 mg by mouth daily.   Yes [provider]  glucosamine-chondroitin 500-400 MG tablet Take 1 tablet by mouth daily.    Yes [provider]  Multiple Vitamins-Minerals (CENTRUM SILVER PO) Take 1 tablet by mouth daily.   Yes [provider]  Tetrahydrozoline HCl (VISINE OP) Place 2 drops into both eyes daily as needed (for dry eyes).   Yes [provider]  TURMERIC CURCUMIN PO Take 500 mg by mouth at bedtime.    Yes [provider]  vitamin B-12 (CYANOCOBALAMIN) 1000 MCG tablet Take 1,000 mcg by mouth 4 (four) times a week.    Yes [provider]  aspirin EC 81 MG tablet Take 81 mg by mouth daily.    [provider]  ibuprofen (ADVIL,MOTRIN) 400 MG tablet Take 1 tablet (400 mg total) by mouth every 6 (six) hours as needed. Patient not taking: Reported on 08/16/2019 06/07/18   Malachy Mood, MD  Prasterone (INTRAROSA) 6.5 MG INST Place 6.5 mg vaginally at bedtime. 08/16/19   Malachy Mood, MD  sulfamethoxazole-trimethoprim (BACTRIM DS) 800-160 MG tablet Take 1 tablet by mouth 2 (two) times daily. Patient not taking: Reported on 08/16/2019 06/18/19   Fredderick Severance, NP    Physical Exam Vitals:  Vitals:   08/16/19 1015  BP: 130/70   No LMP recorded. Patient is postmenopausal.  General: NAD HEENT:  normocephalic, anicteric Thyroid: no enlargement, no palpable nodules Pulmonary: No increased work of breathing Cardiovascular: RRR, distal pulses 2+ Abdomen: NABS, soft, non-tender, non-distended.  Umbilicus without lesions.  No hepatomegaly, splenomegaly or masses palpable. No evidence of hernia  Genitourinary:  External: Normal external female genitalia.  Normal urethral meatus, normal  Bartholin's and Skene's glands.    Vagina: Normal vaginal mucosa, no evidence of prolapse.    Cervix: Grossly normal in appearance, no bleeding  Uterus: Non-enlarged, mobile, normal  contour.  No CMT  Adnexa: ovaries non-enlarged, no adnexal masses  Rectal: deferred  Lymphatic: no evidence of inguinal lymphadenopathy Extremities: no edema, erythema, or tenderness Neurologic: Grossly intact Psychiatric: mood appropriate, affect full  Female chaperone present for pelvic  portions of the physical exam  Assessment: 68 y.o. [id number] pessary maintenance  Plan: Problem List Items Addressed This Visit    None    Visit Diagnoses    Pessary maintenance    -  Primary     1) Pessary - replaced today without difficulty - Discontinue premarin cream given ER positive breast cancer.  We discussed that vaginal estrogen has not systemic absorption but I would prefer to limit estreogen exposure if possible.  Will switch to intrarosa non-estrogen containing to help promote health vaginal mucosa and decrease risk of ulceration.  2) Return in about 3 months (around 11/16/2019) for pessary follow up.    Malachy Mood, MD, [facility], [place]

## 2019-08-22 ENCOUNTER — Telehealth: Payer: Self-pay

## 2019-08-22 NOTE — Telephone Encounter (Signed)
Pt calling for AMS's nurse to call her at home.  [phone]

## 2019-08-22 NOTE — Telephone Encounter (Signed)
LVM for patient to call me back 

## 2019-08-26 NOTE — Telephone Encounter (Signed)
Pt called back stating the package inert for the Intrarosa states if she has had history of Breast cancer not to use. Pt would like something else called in.

## 2019-08-26 NOTE — Telephone Encounter (Signed)
I spoke to patient. Questions and concerns addressed

## 2019-08-27 ENCOUNTER — Telehealth: Payer: Self-pay

## 2019-08-27 NOTE — Telephone Encounter (Signed)
There is no alternative and it is the only non-estrogen containing option meaning the safest option for her to use.

## 2019-08-27 NOTE — Telephone Encounter (Signed)
PT says pessary fell out. Aware to call and schedule appointment for replacement

## 2019-08-27 NOTE — Telephone Encounter (Signed)
Pt calling triage today requesting to speak with Maudie Mercury. Fwding to [employer].

## 2019-08-27 NOTE — Telephone Encounter (Signed)
LVM for patient to call me back 

## 2019-09-25 ENCOUNTER — Encounter: Payer: Self-pay | Admitting: Obstetrics and Gynecology

## 2019-09-25 ENCOUNTER — Ambulatory Visit (INDEPENDENT_AMBULATORY_CARE_PROVIDER_SITE_OTHER): Payer: Medicare Other | Admitting: Obstetrics and Gynecology

## 2019-09-25 ENCOUNTER — Other Ambulatory Visit: Payer: Self-pay

## 2019-09-25 VITALS — BP 140/80 | HR 65 | Wt 211.0 lb

## 2019-09-25 DIAGNOSIS — Z4689 Encounter for fitting and adjustment of other specified devices: Secondary | ICD-10-CM

## 2019-09-25 NOTE — Progress Notes (Signed)
Obstetrics & Gynecology Office Visit   Chief Complaint:  Chief Complaint  Patient presents with  . Pessary Check    History of Present Illness: 68 y.o. presenting for pessary refitting.  She has been using a 2 1/2 Gelhorn but has experienced issues with expulsion.  Had been doing well minus one episode of vaginal erosion requiring brief cessation of pessary.  In there interim she has also received a breast cancer diagnosis and transition to Intrarosa as opposed to vaginal estrogen.  Review of Systems:Review of Systems  Constitutional: Negative.   Gastrointestinal: Negative for abdominal pain.  Genitourinary: Positive for urgency. Negative for dysuria, frequency and hematuria.    Past Medical History:  Past Medical History:  Diagnosis Date  . Arthritis   . Osteopenia 11/28/2018  . Pulmonary embolism ([place]) 04/09/2018   Completed course of blood thinner in 2007; after femur fracture and surgery    Past Surgical History:  Past Surgical History:  Procedure Laterality Date  . BREAST BIOPSY Right 12/13/2018   stereo bx of mass, coil clip INVASIVE MAMMARY CARCINOMA  . BREAST LUMPECTOMY Right 12/31/2018   [place]  . BREAST LUMPECTOMY WITH NEEDLE LOCALIZATION AND AXILLARY SENTINEL LYMPH NODE BX Right 12/31/2018   Procedure: BREAST LUMPECTOMY WITH SENTINEL LYMPH NODE BX, WIRE LOCALIZATION;  Surgeon: Vickie Epley, MD;  Location: [facility] ORS;  Service: General;  Laterality: Right;  . bunionectomy Left   . CERVICAL CONE BIOPSY  1984  . DILATION AND CURETTAGE OF UTERUS  06/07/2018  . HIP PINNING  2007  . HYSTEROSCOPY W/D&C N/A 06/07/2018   Procedure: DILATATION AND CURETTAGE /HYSTEROSCOPY/MYOSURE POLYPECTOMY;  Surgeon: Malachy Mood, MD;  Location: [facility] ORS;  Service: Gynecology;  Laterality: N/A;  . LEG SURGERY  2007   plate  . TONSILLECTOMY    . VAGINAL DELIVERY     x2    Gynecologic History: No LMP recorded. Patient is postmenopausal.  Obstetric History: [id number]  Family  History:  Family History  Problem Relation Age of Onset  . Breast cancer Maternal Aunt 35       deceased 62s  . Breast cancer Maternal Aunt 35       deceased 33s  . Alzheimer's disease Mother        deceased 81  . Lymphoma Father 41       deceased 60  . Heart disease Father   . Skin cancer Brother        currently 79  . Thyroid disease Son   . Breast cancer Paternal Aunt        dx 1s; deceased 32s  . Colon cancer Neg Hx     Social History:  Social History   Socioeconomic History  . Marital status: Married    Spouse name: Alroy Dust  . Number of children: 2  . Years of education: 57  . Highest education level: Some college, no degree  Occupational History  . Occupation: retired  Scientific laboratory technician  . Financial resource strain: Not hard at all  . Food insecurity    Worry: Never true    Inability: Never true  . Transportation needs    Medical: No    Non-medical: No  Tobacco Use  . Smoking status: Never Smoker  . Smokeless tobacco: Never Used  Substance and Sexual Activity  . Alcohol use: No  . Drug use: No  . Sexual activity: Not Currently  Lifestyle  . Physical activity    Days per week: 0 days    Minutes  per session: 0 min  . Stress: Only a little  Relationships  . Social Herbalist on phone: Three times a week    Gets together: Three times a week    Attends religious service: More than 4 times per year    Active member of club or organization: No    Attends meetings of clubs or organizations: Never    Relationship status: Married  . Intimate partner violence    Fear of current or ex partner: No    Emotionally abused: No    Physically abused: No    Forced sexual activity: No  Other Topics Concern  . Not on file  Social History Narrative  . Not on file    Allergies:  No Known Allergies  Medications: Prior to Admission medications   Medication Sig Start Date End Date Taking? Authorizing Provider  acetaminophen (TYLENOL) 650 MG CR tablet Take  650 mg by mouth every 8 (eight) hours as needed for pain.    [provider]  anastrozole (ARIMIDEX) 1 MG tablet Take 1 mg by mouth daily.    [provider]  aspirin EC 81 MG tablet Take 81 mg by mouth daily.    [provider]  Calcium-Magnesium-Vitamin D (CALCIUM 1200+D3 PO) Take 1 tablet by mouth 2 (two) times a week.    [provider]  cetirizine (ZYRTEC) 10 MG tablet Take 10 mg by mouth daily as needed for allergies.     [provider]  Cholecalciferol (VITAMIN D3) 1000 units CAPS Take 1,000 Units by mouth daily.    [provider]  fexofenadine (ALLEGRA) 180 MG tablet Take 180 mg by mouth daily.    [provider]  glucosamine-chondroitin 500-400 MG tablet Take 1 tablet by mouth daily.     [provider]  ibuprofen (ADVIL,MOTRIN) 400 MG tablet Take 1 tablet (400 mg total) by mouth every 6 (six) hours as needed. Patient not taking: Reported on 08/16/2019 06/07/18   Malachy Mood, MD  Multiple Vitamins-Minerals (CENTRUM SILVER PO) Take 1 tablet by mouth daily.    [provider]  Prasterone (INTRAROSA) 6.5 MG INST Place 6.5 mg vaginally at bedtime. 08/16/19   Malachy Mood, MD  sulfamethoxazole-trimethoprim (BACTRIM DS) 800-160 MG tablet Take 1 tablet by mouth 2 (two) times daily. Patient not taking: Reported on 08/16/2019 06/18/19   Fredderick Severance, NP  Tetrahydrozoline HCl (VISINE OP) Place 2 drops into both eyes daily as needed (for dry eyes).    [provider]  TURMERIC CURCUMIN PO Take 500 mg by mouth at bedtime.     [provider]  vitamin B-12 (CYANOCOBALAMIN) 1000 MCG tablet Take 1,000 mcg by mouth 4 (four) times a week.     [provider]    Physical Exam Vitals:  Vitals:   09/25/19 0910  BP: 140/80  Pulse: 65   No LMP recorded. Patient is postmenopausal.  General: NAD HEENT: normocephalic, anicteric Thyroid: no enlargement, no palpable nodules  Pulmonary: No increased work of breathing Cardiovascular: RRR, distal pulses 2+ Abdomen: NABS, soft, non-tender, non-distended.  Umbilicus without lesions.  No hepatomegaly, splenomegaly or masses palpable. No evidence of hernia  Genitourinary:  External: Normal external female genitalia.  Normal urethral meatus, normal Bartholin's and Skene's glands.    Vagina: Normal vaginal mucosa, anterior cystocele   Cervix: Grossly normal in appearance, no bleeding  Rectal: deferred  Lymphatic: no evidence of inguinal lymphadenopathy Extremities: no edema, erythema, or tenderness Neurologic: Grossly intact Psychiatric:  mood appropriate, affect full  Female chaperone present for pelvic  portions of the physical exam  Assessment: 68 y.o. [id number] presenting for pessary maintenance   Plan: Problem List Items Addressed This Visit    None    Visit Diagnoses    Pessary maintenance    -  Primary     - Her size 2 1/2 gelhorn was replaced with a size 6 ring today.    Malachy Mood, MD, [facility], [facility] 10/02/2019, 10:10 AM      Malachy Mood, MD, [place], [place] 09/25/2019, 9:31 AM

## 2019-10-03 ENCOUNTER — Ambulatory Visit (INDEPENDENT_AMBULATORY_CARE_PROVIDER_SITE_OTHER): Payer: Medicare Other | Admitting: Obstetrics and Gynecology

## 2019-10-03 ENCOUNTER — Encounter: Payer: Self-pay | Admitting: Obstetrics and Gynecology

## 2019-10-03 ENCOUNTER — Other Ambulatory Visit: Payer: Self-pay

## 2019-10-03 VITALS — BP 143/82 | Wt 211.0 lb

## 2019-10-03 DIAGNOSIS — Z4689 Encounter for fitting and adjustment of other specified devices: Secondary | ICD-10-CM

## 2019-10-04 ENCOUNTER — Telehealth: Payer: Self-pay

## 2019-10-04 NOTE — Telephone Encounter (Signed)
Pt aware and will call If she starts having any issues before appt.

## 2019-10-04 NOTE — Telephone Encounter (Signed)
Tried to call pt to let her know AMS said to bag it and bring to appt next week. Let me know when pt calls back please.

## 2019-10-04 NOTE — Telephone Encounter (Signed)
Pt calling triage stating her pessary fell out. Wanting to speak with Maudie Mercury and wants to know what AMS would like for her to do

## 2019-10-07 NOTE — Progress Notes (Signed)
Obstetrics & Gynecology Office Visit   Chief Complaint:  Chief Complaint  Patient presents with  . Pessary Check    still in/having to wear pad    History of Present Illness: 68 y.o. presenting for pessary refitting.  She has been using a 2 1/2 Gelhorn but has experienced issues with expulsion.  Was switched to a size 6 ring last visit with no expulsion of that pessary but issues with inadequate control of bladder symptoms. Had been doing well minus one episode of vaginal erosion requiring brief cessation of pessary.  In there interim she has also received a breast cancer diagnosis and transition to Intrarosa as opposed to vaginal estrogen.   Review of Systems: Review of Systems  Constitutional: Negative.   Genitourinary: Negative.   Skin: Negative.      Past Medical History:  Past Medical History:  Diagnosis Date  . Arthritis   . Osteopenia 11/28/2018  . Pulmonary embolism ([place]) 04/09/2018   Completed course of blood thinner in 2007; after femur fracture and surgery    Past Surgical History:  Past Surgical History:  Procedure Laterality Date  . BREAST BIOPSY Right 12/13/2018   stereo bx of mass, coil clip INVASIVE MAMMARY CARCINOMA  . BREAST LUMPECTOMY Right 12/31/2018   [place]  . BREAST LUMPECTOMY WITH NEEDLE LOCALIZATION AND AXILLARY SENTINEL LYMPH NODE BX Right 12/31/2018   Procedure: BREAST LUMPECTOMY WITH SENTINEL LYMPH NODE BX, WIRE LOCALIZATION;  Surgeon: Vickie Epley, MD;  Location: [facility];  Service: General;  Laterality: Right;  . bunionectomy Left   . CERVICAL CONE BIOPSY  1984  . DILATION AND CURETTAGE OF UTERUS  06/07/2018  . HIP PINNING  2007  . HYSTEROSCOPY W/D&C N/A 06/07/2018   Procedure: DILATATION AND CURETTAGE /HYSTEROSCOPY/MYOSURE POLYPECTOMY;  Surgeon: Malachy Mood, MD;  Location: [facility];  Service: Gynecology;  Laterality: N/A;  . LEG SURGERY  2007   plate  . TONSILLECTOMY    . VAGINAL DELIVERY     x2    Gynecologic History: No LMP  recorded. Patient is postmenopausal.  Obstetric History: [id number]  Family History:  Family History  Problem Relation Age of Onset  . Breast cancer Maternal Aunt 35       deceased 5s  . Breast cancer Maternal Aunt 35       deceased 61s  . Alzheimer's disease Mother        deceased 58  . Lymphoma Father 25       deceased 84  . Heart disease Father   . Skin cancer Brother        currently 27  . Thyroid disease Son   . Breast cancer Paternal Aunt        dx 58s; deceased 59s  . Colon cancer Neg Hx     Social History:  Social History   Socioeconomic History  . Marital status: Married    Spouse name: Alroy Dust  . Number of children: 2  . Years of education: 107  . Highest education level: Some college, no degree  Occupational History  . Occupation: retired  Scientific laboratory technician  . Financial resource strain: Not hard at all  . Food insecurity    Worry: Never true    Inability: Never true  . Transportation needs    Medical: No    Non-medical: No  Tobacco Use  . Smoking status: Never Smoker  . Smokeless tobacco: Never Used  Substance and Sexual Activity  . Alcohol use: No  . Drug use:  No  . Sexual activity: Not Currently  Lifestyle  . Physical activity    Days per week: 0 days    Minutes per session: 0 min  . Stress: Only a little  Relationships  . Social Herbalist on phone: Three times a week    Gets together: Three times a week    Attends religious service: More than 4 times per year    Active member of club or organization: No    Attends meetings of clubs or organizations: Never    Relationship status: Married  . Intimate partner violence    Fear of current or ex partner: No    Emotionally abused: No    Physically abused: No    Forced sexual activity: No  Other Topics Concern  . Not on file  Social History Narrative  . Not on file    Allergies:  No Known Allergies  Medications: Prior to Admission medications   Medication Sig Start Date End Date  Taking? Authorizing Provider  acetaminophen (TYLENOL) 650 MG CR tablet Take 650 mg by mouth every 8 (eight) hours as needed for pain.    [provider]  anastrozole (ARIMIDEX) 1 MG tablet Take 1 mg by mouth daily.    [provider]  aspirin EC 81 MG tablet Take 81 mg by mouth daily.    [provider]  Calcium-Magnesium-Vitamin D (CALCIUM 1200+D3 PO) Take 1 tablet by mouth 2 (two) times a week.    [provider]  cetirizine (ZYRTEC) 10 MG tablet Take 10 mg by mouth daily as needed for allergies.     [provider]  Cholecalciferol (VITAMIN D3) 1000 units CAPS Take 1,000 Units by mouth daily.    [provider]  fexofenadine (ALLEGRA) 180 MG tablet Take 180 mg by mouth daily.    [provider]  glucosamine-chondroitin 500-400 MG tablet Take 1 tablet by mouth daily.     [provider]  ibuprofen (ADVIL,MOTRIN) 400 MG tablet Take 1 tablet (400 mg total) by mouth every 6 (six) hours as needed. Patient not taking: Reported on 08/16/2019 06/07/18   Malachy Mood, MD  Multiple Vitamins-Minerals (CENTRUM SILVER PO) Take 1 tablet by mouth daily.    [provider]  Prasterone (INTRAROSA) 6.5 MG INST Place 6.5 mg vaginally at bedtime. 08/16/19   Malachy Mood, MD  sulfamethoxazole-trimethoprim (BACTRIM DS) 800-160 MG tablet Take 1 tablet by mouth 2 (two) times daily. Patient not taking: Reported on 08/16/2019 06/18/19   Fredderick Severance, NP  Tetrahydrozoline HCl (VISINE OP) Place 2 drops into both eyes daily as needed (for dry eyes).    [provider]  TURMERIC CURCUMIN PO Take 500 mg by mouth at bedtime.     [provider]  vitamin B-12 (CYANOCOBALAMIN) 1000 MCG tablet Take 1,000 mcg by mouth 4 (four) times a week.     [provider]    Physical Exam Vitals:  Vitals:   10/03/19 1442  BP: (!) 143/82   No LMP recorded. Patient is postmenopausal.  General: NAD HEENT:  normocephalic, anicteric Pulmonary: No increased work of breathing Genitourinary:  External: Normal external female genitalia.  Normal urethral meatus, normal Bartholin's and Skene's glands.               Vagina: Normal vaginal mucosa, anterior cystocele              Cervix: Grossly normal in appearance, no bleeding  Rectal: deferred             Lymphatic: no evidence of inguinal lymphadenopathy  Extremities: no edema, erythema, or tenderness Neurologic: Grossly intact Psychiatric: mood appropriate, affect full  Female chaperone present for pelvic  portions of the physical exam  Assessment: 68 y.o. [id number] pessary follow up  Plan: Problem List Items Addressed This Visit    None    Visit Diagnoses    Pessary maintenance    -  Primary     1) Patient was changed to a size 6 ring pessary from a size 2 1/2 Gelhorn secondary to expulsion issues with the Gelhorn pessary.  The ring pessary was not expulsed during her 1 week trial but she has felt like bladder symptoms were not well controlled with the ring and she had to wear pads.  We switched to a size 2 (2/5") donut pessary at today's visit.  2) A total of 15 minutes were spent in face-to-face contact with the patient during this encounter with over half of that time devoted to counseling and coordination of care.  3) Return in about 1 week (around 10/10/2019) for pessary check.   Malachy Mood, MD, [facility], [place]

## 2019-10-09 ENCOUNTER — Ambulatory Visit (INDEPENDENT_AMBULATORY_CARE_PROVIDER_SITE_OTHER): Payer: Medicare Other | Admitting: Obstetrics and Gynecology

## 2019-10-09 ENCOUNTER — Other Ambulatory Visit: Payer: Self-pay

## 2019-10-09 ENCOUNTER — Encounter: Payer: Self-pay | Admitting: Obstetrics and Gynecology

## 2019-10-09 VITALS — BP 139/88 | Ht 64.0 in | Wt 211.0 lb

## 2019-10-09 DIAGNOSIS — N393 Stress incontinence (female) (male): Secondary | ICD-10-CM

## 2019-10-09 NOTE — Progress Notes (Signed)
Obstetrics & Gynecology Office Visit   Chief Complaint:  Chief Complaint  Patient presents with  . Pessary Check    fell out    History of Present Illness: 68 y.o.presenting for pessary refitting. She has been using a 2 1/2 Gelhorn but has experienced issues with expulsion.  Was switched to a size 6 ring last visit with no expulsion of that pessary but issues with inadequate control of bladder symptoms.  Size 2.5 Donut then placed but patient had expulsion. Had been doing well minus one episode of vaginal erosion requiring brief cessation of pessary. In there interim she has also received a breast cancer diagnosis and transition to Intrarosa as opposed to vaginal estrogen.   Review of Systems: Review of Systems  All other systems reviewed and are negative.    Past Medical History:  Past Medical History:  Diagnosis Date  . Arthritis   . Osteopenia 11/28/2018  . Pulmonary embolism ([place]) 04/09/2018   Completed course of blood thinner in 2007; after femur fracture and surgery    Past Surgical History:  Past Surgical History:  Procedure Laterality Date  . BREAST BIOPSY Right 12/13/2018   stereo bx of mass, coil clip INVASIVE MAMMARY CARCINOMA  . BREAST LUMPECTOMY Right 12/31/2018   [place]  . BREAST LUMPECTOMY WITH NEEDLE LOCALIZATION AND AXILLARY SENTINEL LYMPH NODE BX Right 12/31/2018   Procedure: BREAST LUMPECTOMY WITH SENTINEL LYMPH NODE BX, WIRE LOCALIZATION;  Surgeon: Vickie Epley, MD;  Location: [facility];  Service: General;  Laterality: Right;  . bunionectomy Left   . CERVICAL CONE BIOPSY  1984  . DILATION AND CURETTAGE OF UTERUS  06/07/2018  . HIP PINNING  2007  . HYSTEROSCOPY W/D&C N/A 06/07/2018   Procedure: DILATATION AND CURETTAGE /HYSTEROSCOPY/MYOSURE POLYPECTOMY;  Surgeon: Malachy Mood, MD;  Location: [facility];  Service: Gynecology;  Laterality: N/A;  . LEG SURGERY  2007   plate  . TONSILLECTOMY    . VAGINAL DELIVERY     x2    Gynecologic History:  No LMP recorded. Patient is postmenopausal.  Obstetric History: [id number]  Family History:  Family History  Problem Relation Age of Onset  . Breast cancer Maternal Aunt 35       deceased 44s  . Breast cancer Maternal Aunt 35       deceased 71s  . Alzheimer's disease Mother        deceased 22  . Lymphoma Father 33       deceased 85  . Heart disease Father   . Skin cancer Brother        currently 3  . Thyroid disease Son   . Breast cancer Paternal Aunt        dx 51s; deceased 59s  . Colon cancer Neg Hx     Social History:  Social History   Socioeconomic History  . Marital status: Married    Spouse name: Alroy Dust  . Number of children: 2  . Years of education: 93  . Highest education level: Some college, no degree  Occupational History  . Occupation: retired  Scientific laboratory technician  . Financial resource strain: Not hard at all  . Food insecurity    Worry: Never true    Inability: Never true  . Transportation needs    Medical: No    Non-medical: No  Tobacco Use  . Smoking status: Never Smoker  . Smokeless tobacco: Never Used  Substance and Sexual Activity  . Alcohol use: No  . Drug use: No  .  Sexual activity: Not Currently  Lifestyle  . Physical activity    Days per week: 0 days    Minutes per session: 0 min  . Stress: Only a little  Relationships  . Social Herbalist on phone: Three times a week    Gets together: Three times a week    Attends religious service: More than 4 times per year    Active member of club or organization: No    Attends meetings of clubs or organizations: Never    Relationship status: Married  . Intimate partner violence    Fear of current or ex partner: No    Emotionally abused: No    Physically abused: No    Forced sexual activity: No  Other Topics Concern  . Not on file  Social History Narrative  . Not on file    Allergies:  No Known Allergies  Medications: Prior to Admission medications   Medication Sig Start Date  End Date Taking? Authorizing Provider  acetaminophen (TYLENOL) 650 MG CR tablet Take 650 mg by mouth every 8 (eight) hours as needed for pain.   Yes [provider]  anastrozole (ARIMIDEX) 1 MG tablet Take 1 mg by mouth daily.   Yes [provider]  aspirin EC 81 MG tablet Take 81 mg by mouth daily.   Yes [provider]  Calcium-Magnesium-Vitamin D (CALCIUM 1200+D3 PO) Take 1 tablet by mouth 2 (two) times a week.   Yes [provider]  cetirizine (ZYRTEC) 10 MG tablet Take 10 mg by mouth daily as needed for allergies.    Yes [provider]  Cholecalciferol (VITAMIN D3) 1000 units CAPS Take 1,000 Units by mouth daily.   Yes [provider]  fexofenadine (ALLEGRA) 180 MG tablet Take 180 mg by mouth daily.   Yes [provider]  glucosamine-chondroitin 500-400 MG tablet Take 1 tablet by mouth daily.    Yes [provider]  Multiple Vitamins-Minerals (CENTRUM SILVER PO) Take 1 tablet by mouth daily.   Yes [provider]  Prasterone (INTRAROSA) 6.5 MG INST Place 6.5 mg vaginally at bedtime. 08/16/19  Yes Malachy Mood, MD  Tetrahydrozoline HCl (VISINE OP) Place 2 drops into both eyes daily as needed (for dry eyes).   Yes [provider]  TURMERIC CURCUMIN PO Take 500 mg by mouth at bedtime.    Yes [provider]  vitamin B-12 (CYANOCOBALAMIN) 1000 MCG tablet Take 1,000 mcg by mouth 4 (four) times a week.    Yes [provider]  ibuprofen (ADVIL,MOTRIN) 400 MG tablet Take 1 tablet (400 mg total) by mouth every 6 (six) hours as needed. Patient not taking: Reported on 08/16/2019 06/07/18   Malachy Mood, MD  sulfamethoxazole-trimethoprim (BACTRIM DS) 800-160 MG tablet Take 1 tablet by mouth 2 (two) times daily. Patient not taking: Reported on 08/16/2019 06/18/19   Fredderick Severance, NP    Physical Exam Vitals:  Vitals:   10/09/19 1132  BP: 139/88   No LMP recorded. Patient is  postmenopausal.  General: NAD HEENT: normocephalic, anicteric Pulmonary: No increased work of breathing Genitourinary:             External: Normal external female genitalia. Normal urethral meatus, normal Bartholin's and Skene's glands.  Vagina: Normal vaginal mucosa, anterior cystocele Cervix: Grossly normal in appearance, no bleeding Rectal: deferred Lymphatic: no evidence of inguinal lymphadenopathy  Extremities: no edema, erythema, or tenderness Neurologic: Grossly intact Psychiatric: mood appropriate, affect full  Female chaperone present  for pelvic  portions of the physical exam  Assessment: 68 y.o. [id number] follow up POP  Plan: Problem List Items Addressed This Visit    None    Visit Diagnoses    Stress incontinence due to pelvic organ prolapse    -  Primary     1) Pessary - given expulsion of 2" donut changed up to 2.75".  Fit appeared good but patient called back stating pessary had once again expelled.  On representation changed up to a 3" donut.  2) Return in about 2 weeks (around 10/23/2019) for pessary check.   Malachy Mood, MD, [facility], [place]

## 2019-10-11 ENCOUNTER — Telehealth: Payer: Self-pay

## 2019-10-11 NOTE — Telephone Encounter (Signed)
Pt calling about pessary.  Doesn't know what to do.  Aware AMS is not in today.  [phone]  Left msg can be seen by another provider today or wait to see AMS next week.

## 2019-10-17 NOTE — Telephone Encounter (Signed)
Left second detailed msg can wait to see AMS on the 28th or call to be seen by another provider before then.

## 2019-10-21 ENCOUNTER — Telehealth: Payer: Self-pay

## 2019-10-21 NOTE — Telephone Encounter (Signed)
LVM for patient to call me back 

## 2019-10-21 NOTE — Telephone Encounter (Signed)
Patient requests a return call from Maudie Mercury, Dr. Georgianne Fick CMA. Cb#[phone]

## 2019-10-22 ENCOUNTER — Telehealth: Payer: Self-pay

## 2019-10-22 NOTE — Telephone Encounter (Signed)
Pt cancelled appointment for tomorrow. Pt states we have tried a lot of pessaries. She is not sure what else there is to do, it is just her body. She would like for AMS to call her to discuss other options. [phone]

## 2019-10-22 NOTE — Telephone Encounter (Signed)
Pt is returning missed call from [place]. Pt stated try the [phone] number

## 2019-10-23 ENCOUNTER — Ambulatory Visit: Payer: Medicare Other | Admitting: Obstetrics and Gynecology

## 2019-10-24 NOTE — Telephone Encounter (Signed)
See next phone message

## 2019-11-01 ENCOUNTER — Telehealth: Payer: Self-pay

## 2019-11-01 NOTE — Telephone Encounter (Signed)
Please advise 

## 2019-11-01 NOTE — Telephone Encounter (Signed)
2 3/4", 3" short stem Gellhorn pessary fitting

## 2019-11-01 NOTE — Telephone Encounter (Signed)
Pt left msg for KJ. Can you pls ask AMS if he can send referral to urologist Hollice Espy. Spoke to [place] at that office and their phone number is [phone].

## 2019-11-04 NOTE — Telephone Encounter (Signed)
2 3/4" & 3" short stem gellhorn fitters ordered. Will contact patient when they arrive to scehdule apt.

## 2019-11-04 NOTE — Telephone Encounter (Signed)
Due we need to order device or is it in office. Please advise so I can schedule appointment for patient.

## 2019-11-13 NOTE — Telephone Encounter (Signed)
Called and left voice mail for patient to call back to be schedule °

## 2019-11-14 NOTE — Telephone Encounter (Signed)
Yeah for for sometime first week of December would be fine next week is pretty much booked solid

## 2019-11-14 NOTE — Telephone Encounter (Signed)
Patient is schedule for 11/25/19 with AMS

## 2019-11-14 NOTE — Telephone Encounter (Signed)
Please advise if patient could be an Work in. Appointment's are schedule out to two week in December

## 2019-11-19 ENCOUNTER — Ambulatory Visit: Payer: Medicare Other | Admitting: Obstetrics and Gynecology

## 2019-11-25 ENCOUNTER — Encounter: Payer: Self-pay | Admitting: Obstetrics and Gynecology

## 2019-11-25 ENCOUNTER — Ambulatory Visit (INDEPENDENT_AMBULATORY_CARE_PROVIDER_SITE_OTHER): Payer: Medicare Other | Admitting: Obstetrics and Gynecology

## 2019-11-25 ENCOUNTER — Other Ambulatory Visit: Payer: Self-pay

## 2019-11-25 VITALS — BP 122/60 | Wt 209.0 lb

## 2019-11-25 DIAGNOSIS — N393 Stress incontinence (female) (male): Secondary | ICD-10-CM | POA: Diagnosis not present

## 2019-11-25 NOTE — Progress Notes (Signed)
Obstetrics & Gynecology Office Visit   Chief Complaint:  Chief Complaint  Patient presents with  . Pessary Check    History of Present Illness: Stacy Warren is a 68 y.o. female who is seen today for a follow-up appointment for a  pessary check. She has been fitted with a 3" donut pessary but has experienced expulsion with this.  She presents today to re-attempt a different size Gelhorn pessary.      Review of Systems: Review of systems negative unless otherwise noted in HPI  Past Medical History:  Past Medical History:  Diagnosis Date  . Arthritis   . Osteopenia 11/28/2018  . Pulmonary embolism ([place]) 04/09/2018   Completed course of blood thinner in 2007; after femur fracture and surgery    Past Surgical History:  Past Surgical History:  Procedure Laterality Date  . BREAST BIOPSY Right 12/13/2018   stereo bx of mass, coil clip INVASIVE MAMMARY CARCINOMA  . BREAST LUMPECTOMY Right 12/31/2018   [place]  . BREAST LUMPECTOMY WITH NEEDLE LOCALIZATION AND AXILLARY SENTINEL LYMPH NODE BX Right 12/31/2018   Procedure: BREAST LUMPECTOMY WITH SENTINEL LYMPH NODE BX, WIRE LOCALIZATION;  Surgeon: Vickie Epley, MD;  Location: [facility] ORS;  Service: General;  Laterality: Right;  . bunionectomy Left   . CERVICAL CONE BIOPSY  1984  . DILATION AND CURETTAGE OF UTERUS  06/07/2018  . HIP PINNING  2007  . HYSTEROSCOPY W/D&C N/A 06/07/2018   Procedure: DILATATION AND CURETTAGE /HYSTEROSCOPY/MYOSURE POLYPECTOMY;  Surgeon: Malachy Mood, MD;  Location: [facility] ORS;  Service: Gynecology;  Laterality: N/A;  . LEG SURGERY  2007   plate  . TONSILLECTOMY    . VAGINAL DELIVERY     x2    Gynecologic History: No LMP recorded. Patient is postmenopausal.  Obstetric History: [id number]  Family History:  Family History  Problem Relation Age of Onset  . Breast cancer Maternal Aunt 35       deceased 62s  . Breast cancer Maternal Aunt 35       deceased 24s  . Alzheimer's disease Mother    deceased 65  . Lymphoma Father 38       deceased 105  . Heart disease Father   . Skin cancer Brother        currently 47  . Thyroid disease Son   . Breast cancer Paternal Aunt        dx 27s; deceased 11s  . Colon cancer Neg Hx     Social History:  Social History   Socioeconomic History  . Marital status: Married    Spouse name: Alroy Dust  . Number of children: 2  . Years of education: 87  . Highest education level: Some college, no degree  Occupational History  . Occupation: retired  Scientific laboratory technician  . Financial resource strain: Not hard at all  . Food insecurity    Worry: Never true    Inability: Never true  . Transportation needs    Medical: No    Non-medical: No  Tobacco Use  . Smoking status: Never Smoker  . Smokeless tobacco: Never Used  Substance and Sexual Activity  . Alcohol use: No  . Drug use: No  . Sexual activity: Not Currently  Lifestyle  . Physical activity    Days per week: 0 days    Minutes per session: 0 min  . Stress: Only a little  Relationships  . Social Herbalist on phone: Three times a week  Gets together: Three times a week    Attends religious service: More than 4 times per year    Active member of club or organization: No    Attends meetings of clubs or organizations: Never    Relationship status: Married  . Intimate partner violence    Fear of current or ex partner: No    Emotionally abused: No    Physically abused: No    Forced sexual activity: No  Other Topics Concern  . Not on file  Social History Narrative  . Not on file    Allergies:  No Known Allergies  Medications: Prior to Admission medications   Medication Sig Start Date End Date Taking? Authorizing Provider  acetaminophen (TYLENOL) 650 MG CR tablet Take 650 mg by mouth every 8 (eight) hours as needed for pain.   Yes [provider]  anastrozole (ARIMIDEX) 1 MG tablet Take 1 mg by mouth daily.   Yes [provider]  aspirin EC 81 MG  tablet Take 81 mg by mouth daily.   Yes [provider]  Calcium-Magnesium-Vitamin D (CALCIUM 1200+D3 PO) Take 1 tablet by mouth 2 (two) times a week.   Yes [provider]  cetirizine (ZYRTEC) 10 MG tablet Take 10 mg by mouth daily as needed for allergies.    Yes [provider]  Cholecalciferol (VITAMIN D3) 1000 units CAPS Take 1,000 Units by mouth daily.   Yes [provider]  fexofenadine (ALLEGRA) 180 MG tablet Take 180 mg by mouth daily.   Yes [provider]  glucosamine-chondroitin 500-400 MG tablet Take 1 tablet by mouth daily.    Yes [provider]  ibuprofen (ADVIL,MOTRIN) 400 MG tablet Take 1 tablet (400 mg total) by mouth every 6 (six) hours as needed. 06/07/18  Yes Malachy Mood, MD  Multiple Vitamins-Minerals (CENTRUM SILVER PO) Take 1 tablet by mouth daily.   Yes [provider]  Prasterone (INTRAROSA) 6.5 MG INST Place 6.5 mg vaginally at bedtime. 08/16/19  Yes Malachy Mood, MD  sulfamethoxazole-trimethoprim (BACTRIM DS) 800-160 MG tablet Take 1 tablet by mouth 2 (two) times daily. 06/18/19  Yes Poulose, Bethel Born, NP  Tetrahydrozoline HCl (VISINE OP) Place 2 drops into both eyes daily as needed (for dry eyes).   Yes [provider]  TURMERIC CURCUMIN PO Take 500 mg by mouth at bedtime.    Yes [provider]  vitamin B-12 (CYANOCOBALAMIN) 1000 MCG tablet Take 1,000 mcg by mouth 4 (four) times a week.    Yes [provider]    Physical Exam Vitals:  Vitals:   11/25/19 1440  BP: 122/60   No LMP recorded. Patient is postmenopausal.  General:NAD HEENT:normocephalic, anicteric Pulmonary:No increased work of breathing Genitourinary: External: Normal external female genitalia. Normal urethral meatus, normal Bartholin's and Skene's glands.  Vagina: Normal vaginal mucosa, anterior cystocele Cervix: Grossly normal in appearance, no bleeding  Rectal: deferred Lymphatic: no evidence of inguinal lymphadenopathy Extremities:no edema, erythema, or tenderness Neurologic:Grossly intact Psychiatric:mood appropriate, affect full  A 3" shotstem gelhorn was placed without difficulty.  Female chaperone present for pelvic portions of the physical exam  Assessment: 68 y.o. [id number] pessary fitting  Plan: Problem List Items Addressed This Visit    None     1) Pessary placement - 3" short stem gelhorn placed today.  Will trial for 1 week.  Has not trialed several pessaries - 2 1/2" Gelhorn - expulsion - Size 6 ring with support - no expulsion but no improvement in incontinence -  2.5" Donut - expulsion - 3" Donut - expulsion  We discussed that ultimately she may need to consider surgical management if unable to find a pessary that fits comfortably and stays in place  2) A total of 15 minutes were spent in face-to-face contact with the patient during this encounter with over half of that time devoted to counseling and coordination of care.  3) Return in about 1 week (around 12/02/2019) for pessary check.   Malachy Mood, MD, [facility], [place]

## 2019-12-04 ENCOUNTER — Encounter: Payer: Self-pay | Admitting: Obstetrics and Gynecology

## 2019-12-04 ENCOUNTER — Ambulatory Visit: Payer: Medicare Other | Admitting: Obstetrics and Gynecology

## 2019-12-04 ENCOUNTER — Other Ambulatory Visit: Payer: Self-pay

## 2019-12-04 VITALS — BP 137/81 | HR 72 | Wt 211.0 lb

## 2019-12-04 DIAGNOSIS — Z4689 Encounter for fitting and adjustment of other specified devices: Secondary | ICD-10-CM | POA: Diagnosis not present

## 2019-12-04 NOTE — Progress Notes (Signed)
Obstetrics & Gynecology Office Visit   Chief Complaint:  Chief Complaint  Patient presents with  . Pessary Check    History of Present Illness: Stacy Warren is a 68 y.o. female who is seen today for a follow-up appointment for a  pessary check. She has been fitted with a 3" short stem Gelhorn, has not experienced expulsion with this. She feel that initially she has some issues with increase stress incontinence, this has improved as the pessary has continued.  No problems with bowl movements.  She is overall pleased with fit and comfort.  No vaginal bleeding.      Review of Systems: Review of systems negative unless noted in HPI  Past Medical History:  Past Medical History:  Diagnosis Date  . Arthritis   . Osteopenia 11/28/2018  . Pulmonary embolism ([place]) 04/09/2018   Completed course of blood thinner in 2007; after femur fracture and surgery    Past Surgical History:  Past Surgical History:  Procedure Laterality Date  . BREAST BIOPSY Right 12/13/2018   stereo bx of mass, coil clip INVASIVE MAMMARY CARCINOMA  . BREAST LUMPECTOMY Right 12/31/2018   [place]  . BREAST LUMPECTOMY WITH NEEDLE LOCALIZATION AND AXILLARY SENTINEL LYMPH NODE BX Right 12/31/2018   Procedure: BREAST LUMPECTOMY WITH SENTINEL LYMPH NODE BX, WIRE LOCALIZATION;  Surgeon: Vickie Epley, MD;  Location: [facility];  Service: General;  Laterality: Right;  . bunionectomy Left   . CERVICAL CONE BIOPSY  1984  . DILATION AND CURETTAGE OF UTERUS  06/07/2018  . HIP PINNING  2007  . HYSTEROSCOPY W/D&C N/A 06/07/2018   Procedure: DILATATION AND CURETTAGE /HYSTEROSCOPY/MYOSURE POLYPECTOMY;  Surgeon: Malachy Mood, MD;  Location: [facility];  Service: Gynecology;  Laterality: N/A;  . LEG SURGERY  2007   plate  . TONSILLECTOMY    . VAGINAL DELIVERY     x2    Gynecologic History: No LMP recorded. Patient is postmenopausal.  Obstetric History: [id number]  Family History:  Family History  Problem Relation Age of  Onset  . Breast cancer Maternal Aunt 35       deceased 76s  . Breast cancer Maternal Aunt 35       deceased 19s  . Alzheimer's disease Mother        deceased 63  . Lymphoma Father 15       deceased 63  . Heart disease Father   . Skin cancer Brother        currently 50  . Thyroid disease Son   . Breast cancer Paternal Aunt        dx 62s; deceased 84s  . Colon cancer Neg Hx     Social History:  Social History   Socioeconomic History  . Marital status: Married    Spouse name: Alroy Dust  . Number of children: 2  . Years of education: 58  . Highest education level: Some college, no degree  Occupational History  . Occupation: retired  Scientific laboratory technician  . Financial resource strain: Not hard at all  . Food insecurity    Worry: Never true    Inability: Never true  . Transportation needs    Medical: No    Non-medical: No  Tobacco Use  . Smoking status: Never Smoker  . Smokeless tobacco: Never Used  Substance and Sexual Activity  . Alcohol use: No  . Drug use: No  . Sexual activity: Not Currently  Lifestyle  . Physical activity    Days per week: 0  days    Minutes per session: 0 min  . Stress: Only a little  Relationships  . Social Herbalist on phone: Three times a week    Gets together: Three times a week    Attends religious service: More than 4 times per year    Active member of club or organization: No    Attends meetings of clubs or organizations: Never    Relationship status: Married  . Intimate partner violence    Fear of current or ex partner: No    Emotionally abused: No    Physically abused: No    Forced sexual activity: No  Other Topics Concern  . Not on file  Social History Narrative  . Not on file    Allergies:  No Known Allergies  Medications: Prior to Admission medications   Medication Sig Start Date End Date Taking? Authorizing Provider  acetaminophen (TYLENOL) 650 MG CR tablet Take 650 mg by mouth every 8 (eight) hours as needed for  pain.    [provider]  anastrozole (ARIMIDEX) 1 MG tablet Take 1 mg by mouth daily.    [provider]  aspirin EC 81 MG tablet Take 81 mg by mouth daily.    [provider]  Calcium-Magnesium-Vitamin D (CALCIUM 1200+D3 PO) Take 1 tablet by mouth 2 (two) times a week.    [provider]  cetirizine (ZYRTEC) 10 MG tablet Take 10 mg by mouth daily as needed for allergies.     [provider]  Cholecalciferol (VITAMIN D3) 1000 units CAPS Take 1,000 Units by mouth daily.    [provider]  fexofenadine (ALLEGRA) 180 MG tablet Take 180 mg by mouth daily.    [provider]  glucosamine-chondroitin 500-400 MG tablet Take 1 tablet by mouth daily.     [provider]  ibuprofen (ADVIL,MOTRIN) 400 MG tablet Take 1 tablet (400 mg total) by mouth every 6 (six) hours as needed. 06/07/18   Malachy Mood, MD  Multiple Vitamins-Minerals (CENTRUM SILVER PO) Take 1 tablet by mouth daily.    [provider]  Prasterone (INTRAROSA) 6.5 MG INST Place 6.5 mg vaginally at bedtime. 08/16/19   Malachy Mood, MD  sulfamethoxazole-trimethoprim (BACTRIM DS) 800-160 MG tablet Take 1 tablet by mouth 2 (two) times daily. 06/18/19   Poulose, Bethel Born, NP  Tetrahydrozoline HCl (VISINE OP) Place 2 drops into both eyes daily as needed (for dry eyes).    [provider]  TURMERIC CURCUMIN PO Take 500 mg by mouth at bedtime.     [provider]  vitamin B-12 (CYANOCOBALAMIN) 1000 MCG tablet Take 1,000 mcg by mouth 4 (four) times a week.     [provider]    Physical Exam Vitals:  Vitals:   12/04/19 0946  BP: 137/81  Pulse: 72   No LMP recorded. Patient is postmenopausal.  General: NAD, well nourished, appears stated age 5: normocephalic, anicteric Pulmonary: No increased work of breathing  Genitourinary: External: Normal external female genitalia. Normal urethral meatus, normal  Bartholin's and Skene's glands.  Vagina: Normal vaginal mucosa, anterior cystocelewell reduced.  Pessary in good position, no evidence of mucosal erosions or bleeding Rectal: deferred Lymphatic: no evidence of inguinal lymphadenopathy Extremities: no edema, erythema, or tenderness Neurologic: Grossly intact Psychiatric: mood appropriate, affect full  Female chaperone present for pelvic  portions of the physical exam  Assessment: 68 y.o. [id number] pessary check  Plan: Problem List Items Addressed This Visit    None  1) Cystocele - good retention of pessary.  Will order 3" shortstem gelhorn given success of tester.  2) A total of 15 minutes were spent in face-to-face contact with the patient during this encounter with over half of that time devoted to counseling and coordination of care.  3) Return in about 3 weeks (around 12/25/2019) for Pessary maintenance.   Malachy Mood, MD, [facility], [facility] 12/04/2019, 1:26 PM

## 2019-12-25 ENCOUNTER — Other Ambulatory Visit: Payer: Self-pay

## 2019-12-25 ENCOUNTER — Encounter: Payer: Self-pay | Admitting: Obstetrics and Gynecology

## 2019-12-25 ENCOUNTER — Ambulatory Visit (INDEPENDENT_AMBULATORY_CARE_PROVIDER_SITE_OTHER): Payer: Medicare Other | Admitting: Obstetrics and Gynecology

## 2019-12-25 VITALS — BP 137/83 | Wt 212.0 lb

## 2019-12-25 DIAGNOSIS — Z4689 Encounter for fitting and adjustment of other specified devices: Secondary | ICD-10-CM | POA: Diagnosis not present

## 2019-12-25 NOTE — Progress Notes (Signed)
Obstetrics & Gynecology Office Visit   Chief Complaint:  Chief Complaint  Patient presents with  . Pessary Check    History of Present Illness: Stacy Warren a 68 y.o.femalewho is seen today for a follow-up appointment for a pessary check. She has been fitted with a 3" short stem Gelhorn,has not experienced expulsion with this. She feel that initially she has some issues with increase stress incontinence, this has improved as the pessary has continued.  No problems with bowl movements.  She remains pleased with fit and comfort.  No vaginal bleeding.    Does report change of insurance and will need prior authorization for use of intrarorsa as off formulary  Review of Systems: Review of Systems  Constitutional: Negative.   Genitourinary: Negative.   Skin: Negative.      Past Medical History:  Past Medical History:  Diagnosis Date  . Arthritis   . Osteopenia 11/28/2018  . Pulmonary embolism ([place]) 04/09/2018   Completed course of blood thinner in 2007; after femur fracture and surgery    Past Surgical History:  Past Surgical History:  Procedure Laterality Date  . BREAST BIOPSY Right 12/13/2018   stereo bx of mass, coil clip INVASIVE MAMMARY CARCINOMA  . BREAST LUMPECTOMY Right 12/31/2018   [place]  . BREAST LUMPECTOMY WITH NEEDLE LOCALIZATION AND AXILLARY SENTINEL LYMPH NODE BX Right 12/31/2018   Procedure: BREAST LUMPECTOMY WITH SENTINEL LYMPH NODE BX, WIRE LOCALIZATION;  Surgeon: Vickie Epley, MD;  Location: [facility];  Service: General;  Laterality: Right;  . bunionectomy Left   . CERVICAL CONE BIOPSY  1984  . DILATION AND CURETTAGE OF UTERUS  06/07/2018  . HIP PINNING  2007  . HYSTEROSCOPY WITH D & C N/A 06/07/2018   Procedure: DILATATION AND CURETTAGE /HYSTEROSCOPY/MYOSURE POLYPECTOMY;  Surgeon: Malachy Mood, MD;  Location: [facility];  Service: Gynecology;  Laterality: N/A;  . LEG SURGERY  2007   plate  . TONSILLECTOMY    . VAGINAL DELIVERY     x2     Gynecologic History: No LMP recorded. Patient is postmenopausal.  Obstetric History: [id number]  Family History:  Family History  Problem Relation Age of Onset  . Breast cancer Maternal Aunt 35       deceased 44s  . Breast cancer Maternal Aunt 35       deceased 67s  . Alzheimer's disease Mother        deceased 47  . Lymphoma Father 45       deceased 48  . Heart disease Father   . Skin cancer Brother        currently 51  . Thyroid disease Son   . Breast cancer Paternal Aunt        dx 19s; deceased 48s  . Colon cancer Neg Hx     Social History:  Social History   Socioeconomic History  . Marital status: Married    Spouse name: Alroy Dust  . Number of children: 2  . Years of education: 67  . Highest education level: Some college, no degree  Occupational History  . Occupation: retired  Tobacco Use  . Smoking status: Never Smoker  . Smokeless tobacco: Never Used  Substance and Sexual Activity  . Alcohol use: No  . Drug use: No  . Sexual activity: Not Currently  Other Topics Concern  . Not on file  Social History Narrative  . Not on file   Social Determinants of Health   Financial Resource Strain: Low Risk   .  Difficulty of Paying Living Expenses: Not hard at all  Food Insecurity: No Food Insecurity  . Worried About Charity fundraiser in the Last Year: Never true  . Ran Out of Food in the Last Year: Never true  Transportation Needs: No Transportation Needs  . Lack of Transportation (Medical): No  . Lack of Transportation (Non-Medical): No  Physical Activity: Inactive  . Days of Exercise per Week: 0 days  . Minutes of Exercise per Session: 0 min  Stress: No Stress Concern Present  . Feeling of Stress : Only a little  Social Connections: Slightly Isolated  . Frequency of Communication with Friends and Family: Three times a week  . Frequency of Social Gatherings with Friends and Family: Three times a week  . Attends Religious Services: More than 4 times per  year  . Active Member of Clubs or Organizations: No  . Attends Archivist Meetings: Never  . Marital Status: Married  Human resources officer Violence: Not At Risk  . Fear of Current or Ex-Partner: No  . Emotionally Abused: No  . Physically Abused: No  . Sexually Abused: No    Allergies:  No Known Allergies  Medications: Prior to Admission medications   Medication Sig Start Date End Date Taking? Authorizing Provider  acetaminophen (TYLENOL) 650 MG CR tablet Take 650 mg by mouth every 8 (eight) hours as needed for pain.    [provider]  anastrozole (ARIMIDEX) 1 MG tablet Take 1 mg by mouth daily.    [provider]  aspirin EC 81 MG tablet Take 81 mg by mouth daily.    [provider]  Calcium-Magnesium-Vitamin D (CALCIUM 1200+D3 PO) Take 1 tablet by mouth 2 (two) times a week.    [provider]  cetirizine (ZYRTEC) 10 MG tablet Take 10 mg by mouth daily as needed for allergies.     [provider]  Cholecalciferol (VITAMIN D3) 1000 units CAPS Take 1,000 Units by mouth daily.    [provider]  fexofenadine (ALLEGRA) 180 MG tablet Take 180 mg by mouth daily.    [provider]  glucosamine-chondroitin 500-400 MG tablet Take 1 tablet by mouth daily.     [provider]  ibuprofen (ADVIL,MOTRIN) 400 MG tablet Take 1 tablet (400 mg total) by mouth every 6 (six) hours as needed. 06/07/18   Malachy Mood, MD  Multiple Vitamins-Minerals (CENTRUM SILVER PO) Take 1 tablet by mouth daily.    [provider]  Prasterone (INTRAROSA) 6.5 MG INST Place 6.5 mg vaginally at bedtime. 08/16/19   Malachy Mood, MD  sulfamethoxazole-trimethoprim (BACTRIM DS) 800-160 MG tablet Take 1 tablet by mouth 2 (two) times daily. 06/18/19   Poulose, Bethel Born, NP  Tetrahydrozoline HCl (VISINE OP) Place 2 drops into both eyes daily as needed (for dry eyes).    [provider]  TURMERIC CURCUMIN PO Take 500 mg by  mouth at bedtime.     [provider]  vitamin B-12 (CYANOCOBALAMIN) 1000 MCG tablet Take 1,000 mcg by mouth 4 (four) times a week.     [provider]    Physical Exam Vitals:  Vitals:   12/25/19 1010  BP: 137/83   No LMP recorded. Patient is postmenopausal.   General: NAD, well nourished, appears stated age 59: normocephalic, anicteric Pulmonary: No increased work of breathing  Genitourinary: External: Normal external female genitalia. Normal urethral meatus, normal Bartholin's and Skene's glands.  Vagina: Normal vaginal mucosa, anterior cystocelewell reduced.  Pessary in good  position, no evidence of mucosal erosions or bleeding Rectal: deferred Lymphatic: no evidence of inguinal lymphadenopathy Extremities: no edema, erythema, or tenderness Neurologic: Grossly intact Psychiatric: mood appropriate, affect full  Female chaperone present for pelvic  portions of the physical exam   Assessment: 68 y.o. [id number] pessary check  Plan: Problem List Items Addressed This Visit    None    Visit Diagnoses    Pessary maintenance    -  Primary     1) 3" short stem Gelhorn test pessary replaced with actually pessary today  2) A total of 15 minutes were spent in face-to-face contact with the patient during this encounter with over half of that time devoted to counseling and coordination of care.  3) Return in about 3 months (around 03/24/2020) for pessary maintenance.   Malachy Mood, MD, [facility], [place]

## 2020-02-07 DIAGNOSIS — N8110 Cystocele, unspecified: Secondary | ICD-10-CM | POA: Insufficient documentation

## 2020-03-25 NOTE — Progress Notes (Signed)
Obstetrics & Gynecology Office Visit   Chief Complaint:  Chief Complaint  Patient presents with  . Pessary Check    History of Present Illness: Stacy A Lutterlohis a 69 y.o.femalewho is seen today for a follow-up appointment for a pessary check. She has been fitted with a 3"short stem Gelhorn,has notexperienced expulsion with this. She feel that initially she has some issues with increase stress incontinence, this has improved as the pessary has continued. No problems with bowl movements. She remains pleased with fit and comfort. No vaginal bleeding.    Review of Systems: review of systems negative unless otherwise noted in HPI  Past Medical History:  Past Medical History:  Diagnosis Date  . Arthritis   . Osteopenia 11/28/2018  . Pulmonary embolism ([place]) 04/09/2018   Completed course of blood thinner in 2007; after femur fracture and surgery    Past Surgical History:  Past Surgical History:  Procedure Laterality Date  . BREAST BIOPSY Right 12/13/2018   stereo bx of mass, coil clip INVASIVE MAMMARY CARCINOMA  . BREAST LUMPECTOMY Right 12/31/2018   [place]  . BREAST LUMPECTOMY WITH NEEDLE LOCALIZATION AND AXILLARY SENTINEL LYMPH NODE BX Right 12/31/2018   Procedure: BREAST LUMPECTOMY WITH SENTINEL LYMPH NODE BX, WIRE LOCALIZATION;  Surgeon: Vickie Epley, MD;  Location: [facility] ORS;  Service: General;  Laterality: Right;  . bunionectomy Left   . CERVICAL CONE BIOPSY  1984  . DILATION AND CURETTAGE OF UTERUS  06/07/2018  . HIP PINNING  2007  . HYSTEROSCOPY WITH D & C N/A 06/07/2018   Procedure: DILATATION AND CURETTAGE /HYSTEROSCOPY/MYOSURE POLYPECTOMY;  Surgeon: Malachy Mood, MD;  Location: [facility] ORS;  Service: Gynecology;  Laterality: N/A;  . LEG SURGERY  2007   plate  . TONSILLECTOMY    . VAGINAL DELIVERY     x2    Gynecologic History: No LMP recorded. Patient is postmenopausal.  Obstetric History: [id number]  Family History:  Family History  Problem  Relation Age of Onset  . Breast cancer Maternal Aunt 35       deceased 62s  . Breast cancer Maternal Aunt 35       deceased 32s  . Alzheimer's disease Mother        deceased 29  . Lymphoma Father 9       deceased 69  . Heart disease Father   . Skin cancer Brother        currently 58  . Thyroid disease Son   . Breast cancer Paternal Aunt        dx 48s; deceased 57s  . Colon cancer Neg Hx     Social History:  Social History   Socioeconomic History  . Marital status: Married    Spouse name: Alroy Dust  . Number of children: 2  . Years of education: 29  . Highest education level: Some college, no degree  Occupational History  . Occupation: retired  Tobacco Use  . Smoking status: Never Smoker  . Smokeless tobacco: Never Used  Substance and Sexual Activity  . Alcohol use: No  . Drug use: No  . Sexual activity: Not Currently  Other Topics Concern  . Not on file  Social History Narrative  . Not on file   Social Determinants of Health   Financial Resource Strain:   . Difficulty of Paying Living Expenses:   Food Insecurity:   . Worried About Charity fundraiser in the Last Year:   . [place] in the Last  Year:   Transportation Needs:   . Film/video editor (Medical):   Marland Kitchen Lack of Transportation (Non-Medical):   Physical Activity:   . Days of Exercise per Week:   . Minutes of Exercise per Session:   Stress:   . Feeling of Stress :   Social Connections:   . Frequency of Communication with Friends and Family:   . Frequency of Social Gatherings with Friends and Family:   . Attends Religious Services:   . Active Member of Clubs or Organizations:   . Attends Archivist Meetings:   Marland Kitchen Marital Status:   Intimate Partner Violence:   . Fear of Current or Ex-Partner:   . Emotionally Abused:   Marland Kitchen Physically Abused:   . Sexually Abused:     Allergies:  No Known Allergies  Medications: Prior to Admission medications   Medication Sig Start Date End  Date Taking? Authorizing Provider  acetaminophen (TYLENOL) 650 MG CR tablet Take 650 mg by mouth every 8 (eight) hours as needed for pain.    [provider]  anastrozole (ARIMIDEX) 1 MG tablet Take 1 mg by mouth daily.    [provider]  aspirin EC 81 MG tablet Take 81 mg by mouth daily.    [provider]  Calcium-Magnesium-Vitamin D (CALCIUM 1200+D3 PO) Take 1 tablet by mouth 2 (two) times a week.    [provider]  cetirizine (ZYRTEC) 10 MG tablet Take 10 mg by mouth daily as needed for allergies.     [provider]  Cholecalciferol (VITAMIN D3) 1000 units CAPS Take 1,000 Units by mouth daily.    [provider]  fexofenadine (ALLEGRA) 180 MG tablet Take 180 mg by mouth daily.    [provider]  glucosamine-chondroitin 500-400 MG tablet Take 1 tablet by mouth daily.     [provider]  ibuprofen (ADVIL,MOTRIN) 400 MG tablet Take 1 tablet (400 mg total) by mouth every 6 (six) hours as needed. 06/07/18   Malachy Mood, MD  Multiple Vitamins-Minerals (CENTRUM SILVER PO) Take 1 tablet by mouth daily.    [provider]  Prasterone (INTRAROSA) 6.5 MG INST Place 6.5 mg vaginally at bedtime. 08/16/19   Malachy Mood, MD  sulfamethoxazole-trimethoprim (BACTRIM DS) 800-160 MG tablet Take 1 tablet by mouth 2 (two) times daily. 06/18/19   Poulose, Bethel Born, NP  Tetrahydrozoline HCl (VISINE OP) Place 2 drops into both eyes daily as needed (for dry eyes).    [provider]  TURMERIC CURCUMIN PO Take 500 mg by mouth at bedtime.     [provider]  vitamin B-12 (CYANOCOBALAMIN) 1000 MCG tablet Take 1,000 mcg by mouth 4 (four) times a week.     [provider]    Physical Exam Vitals: Blood pressure (!) 142/86, weight 211 lb (95.7 kg).   No LMP recorded. Patient is postmenopausal.  HEENT:normocephalic, anicteric Pulmonary:No increased work of breathing   Genitourinary: External: Normal external female genitalia. Normal urethral meatus, normal Bartholin's and Skene's glands.  Vagina: Normal vaginal mucosa, anterior cystocelewell reduced. Pessary in good position, no evidence of mucosal erosions or bleeding Rectal: deferred Lymphatic: no evidence of inguinal lymphadenopathy Extremities:no edema, erythema, or tenderness Neurologic:Grossly intact Psychiatric:mood appropriate, affect full  Female chaperone present for pelvic portions of the physical exam  Assessment: 69 y.o. [id number] follow up pessary maintenance visit  Plan: Problem List Items Addressed This Visit    None    Visit Diagnoses    Encounter for pessary maintenance    -  Primary     1) 3" short stem Gelhorn pessary cleaned and replaced today  2) A total of 15 minutes were spent in face-to-face contact with the patient during this encounter with over half of that time devoted to counseling and coordination of care.  3) Return in about 3 months (around 03/24/2020) for pessary maintenance.  Malachy Mood, MD, [facility], [place]

## 2020-03-26 ENCOUNTER — Other Ambulatory Visit: Payer: Self-pay

## 2020-03-26 ENCOUNTER — Ambulatory Visit (INDEPENDENT_AMBULATORY_CARE_PROVIDER_SITE_OTHER): Payer: Medicare PPO | Admitting: Obstetrics and Gynecology

## 2020-03-26 ENCOUNTER — Encounter: Payer: Self-pay | Admitting: Obstetrics and Gynecology

## 2020-03-26 VITALS — BP 142/86 | Wt 211.0 lb

## 2020-03-26 DIAGNOSIS — Z4689 Encounter for fitting and adjustment of other specified devices: Secondary | ICD-10-CM

## 2020-03-26 DIAGNOSIS — N393 Stress incontinence (female) (male): Secondary | ICD-10-CM

## 2020-04-16 ENCOUNTER — Ambulatory Visit: Payer: Medicare PPO | Attending: Internal Medicine

## 2020-04-16 DIAGNOSIS — Z23 Encounter for immunization: Secondary | ICD-10-CM

## 2020-04-16 NOTE — Progress Notes (Signed)
   Covid-19 Vaccination Clinic  Name:  Stacy Warren    MRN: [id number] DOB: 01-07-1951  04/16/2020  [name] was observed post Covid-19 immunization for 15 minutes without incident. She was provided with Vaccine Information Sheet and instruction to access the V-Safe system.   [name] was instructed to call 911 with any severe reactions post vaccine: Marland Kitchen Difficulty breathing  . Swelling of face and throat  . A fast heartbeat  . A bad rash all over body  . Dizziness and weakness   Immunizations Administered    Name Date Dose VIS Date Route   Pfizer COVID-19 Vaccine 04/16/2020 12:06 PM 0.3 mL 02/19/2019 Intramuscular   Manufacturer: [place]   Lot: [id number]   [place]: [id number]

## 2020-05-02 ENCOUNTER — Ambulatory Visit: Payer: Medicare Other

## 2020-05-12 ENCOUNTER — Ambulatory Visit: Payer: Medicare PPO | Attending: Internal Medicine

## 2020-05-12 DIAGNOSIS — Z23 Encounter for immunization: Secondary | ICD-10-CM

## 2020-05-12 NOTE — Progress Notes (Signed)
   Covid-19 Vaccination Clinic  Name:  Stacy Warren    MRN: [id number] DOB: 1951-06-04  05/12/2020  [name] was observed post Covid-19 immunization for 15 minutes without incident. She was provided with Vaccine Information Sheet and instruction to access the V-Safe system.   [name] was instructed to call 911 with any severe reactions post vaccine: Marland Kitchen Difficulty breathing  . Swelling of face and throat  . A fast heartbeat  . A bad rash all over body  . Dizziness and weakness   Immunizations Administered    Name Date Dose VIS Date Route   Pfizer COVID-19 Vaccine 05/12/2020 12:04 PM 0.3 mL 02/19/2019 Intramuscular   Manufacturer: [place]   Lot: [id number]   [place]: [id number]

## 2020-06-11 DIAGNOSIS — Z79811 Long term (current) use of aromatase inhibitors: Secondary | ICD-10-CM | POA: Insufficient documentation

## 2020-06-11 HISTORY — DX: Long term (current) use of aromatase inhibitors: Z79.811

## 2020-06-15 LAB — HM HEPATITIS C SCREENING LAB: HM Hepatitis Screen: NEGATIVE

## 2020-06-24 ENCOUNTER — Ambulatory Visit (INDEPENDENT_AMBULATORY_CARE_PROVIDER_SITE_OTHER): Payer: Medicare PPO | Admitting: Obstetrics and Gynecology

## 2020-06-24 ENCOUNTER — Encounter: Payer: Self-pay | Admitting: Obstetrics and Gynecology

## 2020-06-24 ENCOUNTER — Ambulatory Visit: Payer: Medicare PPO | Admitting: Obstetrics and Gynecology

## 2020-06-24 ENCOUNTER — Other Ambulatory Visit: Payer: Self-pay

## 2020-06-24 VITALS — BP 137/84 | Wt 210.0 lb

## 2020-06-24 DIAGNOSIS — Z4689 Encounter for fitting and adjustment of other specified devices: Secondary | ICD-10-CM | POA: Diagnosis not present

## 2020-06-24 NOTE — Progress Notes (Signed)
Obstetrics & Gynecology Office Visit   Chief Complaint:  Chief Complaint  Patient presents with  . Pessary Check    History of Present Illness: Araya A Lutterlohis a 69 y.o.femalewho is seen today for a follow-up appointment for a pessary check. She has been fitted with a 3"short stem Gelhorn,has notexperienced expulsion with this. Reports good reduction in prolapse. No problems with bowl movements. Sheremainspleased with fit and comfort. No vaginal bleeding.    Review of Systems: Review of Systems  Constitutional: Negative.   Genitourinary: Negative.   Skin: Negative.      Past Medical History:  Past Medical History:  Diagnosis Date  . Arthritis   . Osteopenia 11/28/2018  . Pulmonary embolism ([place]) 04/09/2018   Completed course of blood thinner in 2007; after femur fracture and surgery    Past Surgical History:  Past Surgical History:  Procedure Laterality Date  . BREAST BIOPSY Right 12/13/2018   stereo bx of mass, coil clip INVASIVE MAMMARY CARCINOMA  . BREAST LUMPECTOMY Right 12/31/2018   [place]  . BREAST LUMPECTOMY WITH NEEDLE LOCALIZATION AND AXILLARY SENTINEL LYMPH NODE BX Right 12/31/2018   Procedure: BREAST LUMPECTOMY WITH SENTINEL LYMPH NODE BX, WIRE LOCALIZATION;  Surgeon: Vickie Epley, MD;  Location: [facility];  Service: General;  Laterality: Right;  . bunionectomy Left   . CERVICAL CONE BIOPSY  1984  . DILATION AND CURETTAGE OF UTERUS  06/07/2018  . HIP PINNING  2007  . HYSTEROSCOPY WITH D & C N/A 06/07/2018   Procedure: DILATATION AND CURETTAGE /HYSTEROSCOPY/MYOSURE POLYPECTOMY;  Surgeon: Malachy Mood, MD;  Location: [facility];  Service: Gynecology;  Laterality: N/A;  . LEG SURGERY  2007   plate  . TONSILLECTOMY    . VAGINAL DELIVERY     x2    Gynecologic History: No LMP recorded. Patient is postmenopausal.  Obstetric History: [id number]  Family History:  Family History  Problem Relation Age of Onset  . Breast cancer Maternal Aunt  35       deceased 51s  . Breast cancer Maternal Aunt 35       deceased 40s  . Alzheimer's disease Mother        deceased 40  . Lymphoma Father 61       deceased 27  . Heart disease Father   . Skin cancer Brother        currently 29  . Thyroid disease Son   . Breast cancer Paternal Aunt        dx 3s; deceased 7s  . Colon cancer Neg Hx     Social History:  Social History   Socioeconomic History  . Marital status: Married    Spouse name: Alroy Dust  . Number of children: 2  . Years of education: 46  . Highest education level: Some college, no degree  Occupational History  . Occupation: retired  Tobacco Use  . Smoking status: Never Smoker  . Smokeless tobacco: Never Used  Vaping Use  . Vaping Use: Never used  Substance and Sexual Activity  . Alcohol use: No  . Drug use: No  . Sexual activity: Not Currently  Other Topics Concern  . Not on file  Social History Narrative  . Not on file   Social Determinants of Health   Financial Resource Strain:   . Difficulty of Paying Living Expenses:   Food Insecurity:   . Worried About Charity fundraiser in the Last Year:   . [place] in the  Last Year:   Transportation Needs:   . Film/video editor (Medical):   Marland Kitchen Lack of Transportation (Non-Medical):   Physical Activity:   . Days of Exercise per Week:   . Minutes of Exercise per Session:   Stress:   . Feeling of Stress :   Social Connections:   . Frequency of Communication with Friends and Family:   . Frequency of Social Gatherings with Friends and Family:   . Attends Religious Services:   . Active Member of Clubs or Organizations:   . Attends Archivist Meetings:   Marland Kitchen Marital Status:   Intimate Partner Violence:   . Fear of Current or Ex-Partner:   . Emotionally Abused:   Marland Kitchen Physically Abused:   . Sexually Abused:     Allergies:  No Known Allergies  Medications: Prior to Admission medications   Medication Sig Start Date End Date Taking?  Authorizing Provider  acetaminophen (TYLENOL) 650 MG CR tablet Take 650 mg by mouth every 8 (eight) hours as needed for pain.   Yes [provider]  anastrozole (ARIMIDEX) 1 MG tablet Take 1 mg by mouth daily.   Yes [provider]  aspirin EC 81 MG tablet Take 81 mg by mouth daily.   Yes [provider]  Calcium-Magnesium-Vitamin D (CALCIUM 1200+D3 PO) Take 1 tablet by mouth 2 (two) times a week.   Yes [provider]  cetirizine (ZYRTEC) 10 MG tablet Take 10 mg by mouth daily as needed for allergies.    Yes [provider]  Cholecalciferol (VITAMIN D3) 1000 units CAPS Take 1,000 Units by mouth daily.   Yes [provider]  fexofenadine (ALLEGRA) 180 MG tablet Take 180 mg by mouth daily.   Yes [provider]  glucosamine-chondroitin 500-400 MG tablet Take 1 tablet by mouth daily.    Yes [provider]  ibuprofen (ADVIL,MOTRIN) 400 MG tablet Take 1 tablet (400 mg total) by mouth every 6 (six) hours as needed. 06/07/18  Yes Malachy Mood, MD  Multiple Vitamins-Minerals (CENTRUM SILVER PO) Take 1 tablet by mouth daily.   Yes [provider]  Prasterone (INTRAROSA) 6.5 MG INST Place 6.5 mg vaginally at bedtime. 08/16/19  Yes Malachy Mood, MD  sulfamethoxazole-trimethoprim (BACTRIM DS) 800-160 MG tablet Take 1 tablet by mouth 2 (two) times daily. 06/18/19  Yes Poulose, Bethel Born, NP  Tetrahydrozoline HCl (VISINE OP) Place 2 drops into both eyes daily as needed (for dry eyes).   Yes [provider]  TURMERIC CURCUMIN PO Take 500 mg by mouth at bedtime.    Yes [provider]  vitamin B-12 (CYANOCOBALAMIN) 1000 MCG tablet Take 1,000 mcg by mouth 4 (four) times a week.    Yes [provider]    Physical Exam Vitals:  Vitals:   06/24/20 0953  BP: 137/84   No LMP recorded. Patient is postmenopausal.  HEENT:normocephalic, anicteric Pulmonary:No increased work of breathing    Genitourinary: External: Normal external female genitalia. Normal urethral meatus, normal Bartholin's and Skene's glands.  Vagina: Normal vaginal mucosa, anterior cystocelewell reduced. Pessary in good position, no evidence of mucosal erosions or bleeding Rectal: deferred Lymphatic: no evidence of inguinal lymphadenopathy Extremities:no edema, erythema, or tenderness Neurologic:Grossly intact Psychiatric:mood appropriate, affect full  Female chaperone present for pelvic portions of the physical exam   Assessment: 69 y.o. [id number] pessary maintenance  Plan: Problem List Items Addressed This Visit    None    Visit Diagnoses    Encounter for pessary maintenance    -  Primary     1) 3" short stem Gelhorn pessary cleaned and replaced today, healthy vaginal mucosal surfaces.  Continue Interosa   2)A total of 15 minutes were spent in face-to-face contact with the patient during this encounter with over half of that time devoted to counseling and coordination of care.  3) Return in about 3 months (around 09/24/2020) for pessary check.    Malachy Mood, MD, [facility], [facility] 06/24/2020, 10:26 AM

## 2020-07-17 ENCOUNTER — Telehealth: Payer: Self-pay

## 2020-07-17 NOTE — Telephone Encounter (Signed)
Pt calling triage for Stacy Warren, Pt said her pharmacy said call your Dr about the intarosa, the coupon did not help with the price, pt needing to know next step. she's aware Stacy Warren is out if the office.

## 2020-07-21 NOTE — Telephone Encounter (Signed)
Pt calling; sorry she missed your yesterday; she is available today; please call on home phone.  [phone]

## 2020-07-22 NOTE — Telephone Encounter (Signed)
Can pt try anything else?

## 2020-07-23 NOTE — Telephone Encounter (Signed)
No we chose the intrarosa because of her history of breast cancer

## 2020-07-24 NOTE — Telephone Encounter (Signed)
PT aware, via voicemail, I spoke to [place] [facility] and he states the medication is on back order at the present time. He stated he is going to put an order in from a different vendor and let pt know when it is available. He also stated the discount card can be used.

## 2020-07-27 ENCOUNTER — Other Ambulatory Visit: Payer: Self-pay | Admitting: Obstetrics and Gynecology

## 2020-07-27 NOTE — Telephone Encounter (Signed)
advise

## 2020-08-05 ENCOUNTER — Telehealth: Payer: Self-pay

## 2020-08-05 NOTE — Telephone Encounter (Signed)
Pt calling to speak with Maudie Mercury. States the medicare is not going to pay for RX and would like Kim to call her back. Possibly needing a PA?

## 2020-08-05 NOTE — Telephone Encounter (Signed)
LVM for pt to call back with the name of medication

## 2020-08-13 NOTE — Telephone Encounter (Signed)
Patient calling for Maudie Mercury. Inquiring where we are with the  [facility]. She's down to 1. [phone]

## 2020-08-14 NOTE — Telephone Encounter (Signed)
Pt aware since her new insurance is [employer] we are having to work on a prior authorization for the [employer]. She is aware I am placing samples at the front desk for her to use in the meantime. I will keep her updated as I am updated.

## 2020-08-21 NOTE — Telephone Encounter (Signed)
Appeal request has been approved through [facility] for [place]. The approval is through 12/25/20.  Pt is aware

## 2020-09-23 ENCOUNTER — Encounter: Payer: Self-pay | Admitting: Obstetrics and Gynecology

## 2020-09-23 ENCOUNTER — Ambulatory Visit (INDEPENDENT_AMBULATORY_CARE_PROVIDER_SITE_OTHER): Payer: Medicare PPO | Admitting: Obstetrics and Gynecology

## 2020-09-23 ENCOUNTER — Other Ambulatory Visit: Payer: Self-pay

## 2020-09-23 VITALS — BP 125/85 | HR 73 | Wt 212.0 lb

## 2020-09-23 DIAGNOSIS — Z4689 Encounter for fitting and adjustment of other specified devices: Secondary | ICD-10-CM | POA: Diagnosis not present

## 2020-09-23 NOTE — Progress Notes (Signed)
Obstetrics & Gynecology Office Visit   Chief Complaint:  Chief Complaint  Patient presents with  . Pessary Check    History of Present Illness: Stacy A Lutterlohis a 69 y.o.femalewho is seen today for a follow-up appointment for a pessary check. She has been fitted with a 3"short stem Gelhorn,has notexperienced expulsion with this. Reports good reduction in prolapse. No problems with bowl movements. Sheremainspleased with fit and comfort. No vaginal bleeding.   Review of Systems: Review of Systems  Gastrointestinal: Negative.   Genitourinary: Negative.   Skin: Negative.      Past Medical History:  Past Medical History:  Diagnosis Date  . Arthritis   . Osteopenia 11/28/2018  . Pulmonary embolism ([place]) 04/09/2018   Completed course of blood thinner in 2007; after femur fracture and surgery    Past Surgical History:  Past Surgical History:  Procedure Laterality Date  . BREAST BIOPSY Right 12/13/2018   stereo bx of mass, coil clip INVASIVE MAMMARY CARCINOMA  . BREAST LUMPECTOMY Right 12/31/2018   [place]  . BREAST LUMPECTOMY WITH NEEDLE LOCALIZATION AND AXILLARY SENTINEL LYMPH NODE BX Right 12/31/2018   Procedure: BREAST LUMPECTOMY WITH SENTINEL LYMPH NODE BX, WIRE LOCALIZATION;  Surgeon: Vickie Epley, MD;  Location: [facility];  Service: General;  Laterality: Right;  . bunionectomy Left   . CERVICAL CONE BIOPSY  1984  . DILATION AND CURETTAGE OF UTERUS  06/07/2018  . HIP PINNING  2007  . HYSTEROSCOPY WITH D & C N/A 06/07/2018   Procedure: DILATATION AND CURETTAGE /HYSTEROSCOPY/MYOSURE POLYPECTOMY;  Surgeon: Malachy Mood, MD;  Location: [facility];  Service: Gynecology;  Laterality: N/A;  . LEG SURGERY  2007   plate  . TONSILLECTOMY    . VAGINAL DELIVERY     x2    Gynecologic History: No LMP recorded. Patient is postmenopausal.  Obstetric History: [id number]  Family History:  Family History  Problem Relation Age of Onset  . Breast cancer Maternal Aunt  35       deceased 3s  . Breast cancer Maternal Aunt 35       deceased 35s  . Alzheimer's disease Mother        deceased 56  . Lymphoma Father 64       deceased 43  . Heart disease Father   . Skin cancer Brother        currently 20  . Thyroid disease Son   . Breast cancer Paternal Aunt        dx 21s; deceased 71s  . Colon cancer Neg Hx     Social History:  Social History   Socioeconomic History  . Marital status: Married    Spouse name: Alroy Dust  . Number of children: 2  . Years of education: 67  . Highest education level: Some college, no degree  Occupational History  . Occupation: retired  Tobacco Use  . Smoking status: Never Smoker  . Smokeless tobacco: Never Used  Vaping Use  . Vaping Use: Never used  Substance and Sexual Activity  . Alcohol use: No  . Drug use: No  . Sexual activity: Not Currently  Other Topics Concern  . Not on file  Social History Narrative  . Not on file   Social Determinants of Health   Financial Resource Strain:   . Difficulty of Paying Living Expenses: Not on file  Food Insecurity:   . Worried About Charity fundraiser in the Last Year: Not on file  . Ran Out of  Food in the Last Year: Not on file  Transportation Needs:   . Lack of Transportation (Medical): Not on file  . Lack of Transportation (Non-Medical): Not on file  Physical Activity:   . Days of Exercise per Week: Not on file  . Minutes of Exercise per Session: Not on file  Stress:   . Feeling of Stress : Not on file  Social Connections:   . Frequency of Communication with Friends and Family: Not on file  . Frequency of Social Gatherings with Friends and Family: Not on file  . Attends Religious Services: Not on file  . Active Member of Clubs or Organizations: Not on file  . Attends Archivist Meetings: Not on file  . Marital Status: Not on file  Intimate Partner Violence:   . Fear of Current or Ex-Partner: Not on file  . Emotionally Abused: Not on file  .  Physically Abused: Not on file  . Sexually Abused: Not on file    Allergies:  No Known Allergies  Medications: Prior to Admission medications   Medication Sig Start Date End Date Taking? Authorizing Provider  acetaminophen (TYLENOL) 650 MG CR tablet Take 650 mg by mouth every 8 (eight) hours as needed for pain.    [provider]  anastrozole (ARIMIDEX) 1 MG tablet Take 1 mg by mouth daily.    [provider]  aspirin EC 81 MG tablet Take 81 mg by mouth daily.    [provider]  Calcium-Magnesium-Vitamin D (CALCIUM 1200+D3 PO) Take 1 tablet by mouth 2 (two) times a week.    [provider]  cetirizine (ZYRTEC) 10 MG tablet Take 10 mg by mouth daily as needed for allergies.     [provider]  Cholecalciferol (VITAMIN D3) 1000 units CAPS Take 1,000 Units by mouth daily.    [provider]  fexofenadine (ALLEGRA) 180 MG tablet Take 180 mg by mouth daily.    [provider]  glucosamine-chondroitin 500-400 MG tablet Take 1 tablet by mouth daily.     [provider]  ibuprofen (ADVIL,MOTRIN) 400 MG tablet Take 1 tablet (400 mg total) by mouth every 6 (six) hours as needed. 06/07/18   Malachy Mood, MD  Multiple Vitamins-Minerals (CENTRUM SILVER PO) Take 1 tablet by mouth daily.    [provider]  Prasterone (INTRAROSA) 6.5 MG INST Place 6.5 mg vaginally at bedtime. 08/16/19   Malachy Mood, MD  sulfamethoxazole-trimethoprim (BACTRIM DS) 800-160 MG tablet Take 1 tablet by mouth 2 (two) times daily. 06/18/19   Poulose, Bethel Born, NP  Tetrahydrozoline HCl (VISINE OP) Place 2 drops into both eyes daily as needed (for dry eyes).    [provider]  TURMERIC CURCUMIN PO Take 500 mg by mouth at bedtime.     [provider]  vitamin B-12 (CYANOCOBALAMIN) 1000 MCG tablet Take 1,000 mcg by mouth 4 (four) times a week.     [provider]    Physical Exam Vitals: Blood pressure 125/85,  pulse 73, weight 212 lb (96.2 kg).  No LMP recorded. Patient is postmenopausal.  HEENT:normocephalic, anicteric Pulmonary:No increased work of breathing  Genitourinary: External: Normal external female genitalia. Normal urethral meatus, normal Bartholin's and Skene's glands.  Vagina: Normal vaginal mucosa, anterior cystocelewell reduced. Pessary in good position, no evidence of mucosal erosions or bleeding Rectal: deferred Lymphatic: no evidence of inguinal lymphadenopathy Extremities:no edema, erythema, or tenderness Neurologic:Grossly intact Psychiatric:mood appropriate, affect full  Female chaperone present for pelvic portions of the physical  exam Assessment: 69 y.o. G2P2002 pessary maintenance  Plan: Problem List Items Addressed This Visit    None    Visit Diagnoses    Encounter for pessary maintenance    -  Primary     1) 3" short stem Gelhorn pessarycleaned andreplaced today, healthy vaginal mucosal surfaces.  Continue Interosa   2)A total of 15 minutes were spent in face-to-face contact with the patient during this encounter with over half of that time devoted to counseling and coordination of care.  3) Return in about 3 months (around 09/24/2020) for pessary check.  Malachy Mood, MD, [facility], [facility] 09/23/2020, 9:12 AM

## 2020-10-28 ENCOUNTER — Other Ambulatory Visit: Payer: Self-pay | Admitting: Obstetrics and Gynecology

## 2020-11-02 NOTE — Telephone Encounter (Signed)
[facility] reports [facility] from [facility] regarding the [place]. The rx was too old. [phone]

## 2020-12-30 ENCOUNTER — Encounter: Payer: Self-pay | Admitting: Obstetrics and Gynecology

## 2020-12-30 ENCOUNTER — Other Ambulatory Visit: Payer: Self-pay

## 2020-12-30 ENCOUNTER — Ambulatory Visit (INDEPENDENT_AMBULATORY_CARE_PROVIDER_SITE_OTHER): Payer: Medicare PPO | Admitting: Obstetrics and Gynecology

## 2020-12-30 VITALS — BP 145/83 | Ht 64.0 in | Wt 211.0 lb

## 2020-12-30 DIAGNOSIS — Z4689 Encounter for fitting and adjustment of other specified devices: Secondary | ICD-10-CM | POA: Diagnosis not present

## 2020-12-30 NOTE — Progress Notes (Signed)
Obstetrics & Gynecology Office Visit   Chief Complaint:  Chief Complaint  Patient presents with  . Pessary Check    3 month f/u pessary check -  RM 3    History of Present Illness:Stacy A Lutterlohis a 70y.o.femalewho is seen today for a follow-up appointment for a pessary check. She has been fitted with a 3"short stem Gelhorn,has notexperienced expulsion with this.Reports good reduction in prolapse. No problems with bowl movements. Sheremainspleased with fit and comfort. No vaginal bleeding.    Review of Systems: review of systems negative unless noted in HPI  Past Medical History:  Past Medical History:  Diagnosis Date  . Arthritis   . Osteopenia 11/28/2018  . Pulmonary embolism (HCC) 04/09/2018   Completed course of blood thinner in 2007; after femur fracture and surgery    Past Surgical History:  Past Surgical History:  Procedure Laterality Date  . BREAST BIOPSY Right 12/13/2018   stereo bx of mass, coil clip INVASIVE MAMMARY CARCINOMA  . BREAST LUMPECTOMY Right 12/31/2018   [facility]  . BREAST LUMPECTOMY WITH NEEDLE LOCALIZATION AND AXILLARY SENTINEL LYMPH NODE BX Right 12/31/2018   Procedure: BREAST LUMPECTOMY WITH SENTINEL LYMPH NODE BX, WIRE LOCALIZATION;  Surgeon: Ancil Linsey, MD;  Location: [facility] ORS;  Service: General;  Laterality: Right;  . bunionectomy Left   . CERVICAL CONE BIOPSY  1984  . DILATION AND CURETTAGE OF UTERUS  06/07/2018  . HIP PINNING  2007  . HYSTEROSCOPY WITH D & C N/A 06/07/2018   Procedure: DILATATION AND CURETTAGE /HYSTEROSCOPY/MYOSURE POLYPECTOMY;  Surgeon: Vena Austria, MD;  Location: [facility] ORS;  Service: Gynecology;  Laterality: N/A;  . LEG SURGERY  2007   plate  . TONSILLECTOMY    . VAGINAL DELIVERY     x2    Gynecologic History: No LMP recorded. Patient is postmenopausal.  Obstetric History: [id number]  Family History:  Family History  Problem Relation Age of Onset  . Breast cancer Maternal Aunt 35        deceased 30s  . Breast cancer Maternal Aunt 35       deceased 30s  . Alzheimer's disease Mother        deceased 69  . Lymphoma Father 23       deceased 61  . Heart disease Father   . Skin cancer Brother        currently 47  . Thyroid disease Son   . Breast cancer Paternal Aunt        dx 41s; deceased 80s  . Colon cancer Neg Hx     Social History:  Social History   Socioeconomic History  . Marital status: Married    Spouse name: Clovis Riley  . Number of children: 2  . Years of education: 60  . Highest education level: Some college, no degree  Occupational History  . Occupation: retired  Tobacco Use  . Smoking status: Never Smoker  . Smokeless tobacco: Never Used  Vaping Use  . Vaping Use: Never used  Substance and Sexual Activity  . Alcohol use: No  . Drug use: No  . Sexual activity: Not Currently  Other Topics Concern  . Not on file  Social History Narrative  . Not on file   Social Determinants of Health   Financial Resource Strain: Not on file  Food Insecurity: Not on file  Transportation Needs: Not on file  Physical Activity: Not on file  Stress: Not on file  Social Connections: Not on file  Intimate Partner  Violence: Not on file    Allergies:  No Known Allergies  Medications: Prior to Admission medications   Medication Sig Start Date End Date Taking? Authorizing Provider  acetaminophen (TYLENOL) 650 MG CR tablet Take 650 mg by mouth every 8 (eight) hours as needed for pain.   Yes [provider]  anastrozole (ARIMIDEX) 1 MG tablet Take 1 mg by mouth daily.   Yes [provider]  Calcium-Magnesium-Vitamin D (CALCIUM 1200+D3 PO) Take 1 tablet by mouth 2 (two) times a week.   Yes [provider]  cetirizine (ZYRTEC) 10 MG tablet Take 10 mg by mouth daily as needed for allergies.    Yes [provider]  Cholecalciferol (VITAMIN D3) 1000 units CAPS Take 1,000 Units by mouth daily.   Yes [provider]   fexofenadine (ALLEGRA) 180 MG tablet Take 180 mg by mouth daily.   Yes [provider]  glucosamine-chondroitin 500-400 MG tablet Take 1 tablet by mouth daily.    Yes [provider]  ibuprofen (ADVIL,MOTRIN) 400 MG tablet Take 1 tablet (400 mg total) by mouth every 6 (six) hours as needed. 06/07/18  Yes Vena Austria, MD  INTRAROSA 6.5 MG INST PLACE 6.5 MG VAGINALLY AT BEDTIME. 11/02/20  Yes Vena Austria, MD  Multiple Vitamins-Minerals (CENTRUM SILVER PO) Take 1 tablet by mouth daily.   Yes [provider]  sulfamethoxazole-trimethoprim (BACTRIM DS) 800-160 MG tablet Take 1 tablet by mouth 2 (two) times daily. 06/18/19  Yes Poulose, Percell Belt, NP  Tetrahydrozoline HCl (VISINE OP) Place 2 drops into both eyes daily as needed (for dry eyes).   Yes [provider]  TURMERIC CURCUMIN PO Take 500 mg by mouth at bedtime.    Yes [provider]  vitamin B-12 (CYANOCOBALAMIN) 1000 MCG tablet Take 1,000 mcg by mouth 4 (four) times a week.    Yes [provider]  aspirin EC 81 MG tablet Take 81 mg by mouth daily. Patient not taking: Reported on 12/30/2020    [provider]    Physical Exam Vitals:  Vitals:   12/30/20 0926  BP: (!) 145/83   No LMP recorded. Patient is postmenopausal.   HEENT:normocephalic, anicteric Pulmonary:No increased work of breathing  Genitourinary: External: Normal external female genitalia. Normal urethral meatus, normal Bartholin's and Skene's glands.  Vagina: Normal vaginal mucosa, anterior cystocelewell reduced. Pessary in good position, no evidence of mucosal erosionssome light bleeding from removal. The disk portion of the pessary was difficult to remove secondary to narrowing of the introitus Rectal: deferred Lymphatic: no evidence of inguinal lymphadenopathy Extremities:no edema, erythema, or tenderness Neurologic:Grossly  intact Psychiatric:mood appropriate, affect full  Female chaperone present for pelvic  portions of the physical exam  Assessment: 70 y.o. [id number] pessary follow up  Plan: Problem List Items Addressed This Visit   None   Visit Diagnoses    Encounter for pessary maintenance    -  Primary     1) Pessary maintenance - significant difficulty encoutered in pessary removal today with narrowing of introitus making removal of the disk portion of the Gelhorn difficult.  She has tried several different pessaries but given the difficulty in removal today will keep pessary out for  2 weeks then resize to smaller Gelhorn  2) A total of 15 minutes were spent in face-to-face contact with the patient during this encounter with over half of that time devoted to counseling and coordination of care.  3) Return in about 2 weeks (around 01/13/2021) for pessary fitting.  Vena Austria, MD, [facility], [facility]

## 2021-01-15 ENCOUNTER — Ambulatory Visit: Payer: Medicare PPO | Admitting: Obstetrics and Gynecology

## 2022-10-05 ENCOUNTER — Encounter: Payer: Self-pay | Admitting: Ophthalmology

## 2022-10-10 NOTE — Discharge Instructions (Signed)

## 2022-10-12 ENCOUNTER — Ambulatory Visit: Payer: Medicare PPO | Admitting: Anesthesiology

## 2022-10-12 ENCOUNTER — Encounter
Admission: RE | Disposition: A | Discharge status: Home / Self Care | Payer: Self-pay | Source: Ambulatory Visit | Attending: Ophthalmology

## 2022-10-12 ENCOUNTER — Encounter: Payer: Self-pay | Admitting: Ophthalmology

## 2022-10-12 ENCOUNTER — Other Ambulatory Visit: Payer: Self-pay

## 2022-10-12 ENCOUNTER — Ambulatory Visit
Admission: RE | Admit: 2022-10-12 | Discharge: 2022-10-12 | Disposition: A | Discharge status: Home / Self Care | Payer: Medicare PPO | Source: Ambulatory Visit | Attending: Ophthalmology | Admitting: Ophthalmology

## 2022-10-12 DIAGNOSIS — Z86711 Personal history of pulmonary embolism: Secondary | ICD-10-CM | POA: Insufficient documentation

## 2022-10-12 DIAGNOSIS — M8580 Other specified disorders of bone density and structure, unspecified site: Secondary | ICD-10-CM | POA: Insufficient documentation

## 2022-10-12 DIAGNOSIS — H2512 Age-related nuclear cataract, left eye: Secondary | ICD-10-CM | POA: Diagnosis present

## 2022-10-12 HISTORY — DX: Motion sickness, initial encounter: T75.3XXA

## 2022-10-12 HISTORY — PX: CATARACT EXTRACTION W/PHACO: SHX586

## 2022-10-12 SURGERY — PHACOEMULSIFICATION, CATARACT, WITH IOL INSERTION
Anesthesia: Monitor Anesthesia Care | Site: Eye | Laterality: Left

## 2022-10-12 MED ORDER — SIGHTPATH DOSE#1 BSS IO SOLN: INTRAOCULAR | Status: DC | PRN

## 2022-10-12 MED ORDER — FENTANYL CITRATE (PF) 100 MCG/2ML IJ SOLN
INTRAMUSCULAR | Status: DC | PRN
  Administered 2022-10-12: 50 ug via INTRAVENOUS

## 2022-10-12 MED ORDER — TETRACAINE HCL 0.5 % OP SOLN
1.0000 [drp] | OPHTHALMIC | Status: DC | PRN
  Administered 2022-10-12 (×3): 1 [drp] via OPHTHALMIC

## 2022-10-12 MED ORDER — ARMC OPHTHALMIC DILATING DROPS
1.0000 | OPHTHALMIC | Status: DC | PRN
  Administered 2022-10-12 (×3): 1 via OPHTHALMIC

## 2022-10-12 MED ORDER — MIDAZOLAM HCL 2 MG/2ML IJ SOLN
INTRAMUSCULAR | Status: DC | PRN
  Administered 2022-10-12: 1 mg via INTRAVENOUS

## 2022-10-12 MED ORDER — BRIMONIDINE TARTRATE-TIMOLOL 0.2-0.5 % OP SOLN
OPHTHALMIC | Status: DC | PRN
  Administered 2022-10-12: 1 [drp] via OPHTHALMIC

## 2022-10-12 MED ORDER — SIGHTPATH DOSE#1 NA HYALUR & NA CHOND-NA HYALUR IO KIT
PACK | INTRAOCULAR | Status: DC | PRN
  Administered 2022-10-12: 1 via OPHTHALMIC

## 2022-10-12 MED ORDER — SIGHTPATH DOSE#1 BSS IO SOLN
INTRAOCULAR | Status: DC | PRN
  Administered 2022-10-12: 61 mL via OPHTHALMIC

## 2022-10-12 MED ORDER — SIGHTPATH DOSE#1 BSS IO SOLN
INTRAOCULAR | Status: DC | PRN
  Administered 2022-10-12: 15 mL

## 2022-10-12 MED ORDER — CEFUROXIME OPHTHALMIC INJECTION 1 MG/0.1 ML
INJECTION | OPHTHALMIC | Status: DC | PRN
  Administered 2022-10-12: 0.1 mL via INTRACAMERAL

## 2022-10-12 SURGICAL SUPPLY — 10 items
CATARACT SUITE SIGHTPATH (MISCELLANEOUS) ×1 IMPLANT
FEE CATARACT SUITE SIGHTPATH (MISCELLANEOUS) ×1 IMPLANT
GLOVE SRG 8 PF TXTR STRL LF DI (GLOVE) ×1 IMPLANT
GLOVE SURG ENC TEXT LTX SZ7.5 (GLOVE) ×1 IMPLANT
GLOVE SURG UNDER POLY LF SZ8 (GLOVE) ×1
LENS IOL TECNIS EYHANCE 21.5 (Intraocular Lens) IMPLANT
NDL FILTER BLUNT 18X1 1/2 (NEEDLE) ×1 IMPLANT
NEEDLE FILTER BLUNT 18X1 1/2 (NEEDLE) ×1 IMPLANT
SYR 3ML LL SCALE MARK (SYRINGE) ×1 IMPLANT
WATER STERILE IRR 250ML POUR (IV SOLUTION) ×1 IMPLANT

## 2022-10-12 NOTE — Anesthesia Postprocedure Evaluation (Signed)
Anesthesia Post Note  Patient: Stacy Warren  Procedure(s) Performed: CATARACT EXTRACTION PHACO AND INTRAOCULAR LENS PLACEMENT (IOC) LEFT 11.13 01:13.9 (Left: Eye)  Patient location during evaluation: PACU Anesthesia Type: MAC Level of consciousness: awake and alert Pain management: pain level controlled Vital Signs Assessment: post-procedure vital signs reviewed and stable Respiratory status: spontaneous breathing, nonlabored ventilation, respiratory function stable and patient connected to nasal cannula oxygen Cardiovascular status: stable and blood pressure returned to baseline Postop Assessment: no apparent nausea or vomiting Anesthetic complications: no   No notable events documented.   Last Vitals:  Vitals:   10/12/22 1235 10/12/22 1242  BP: 113/67 132/80  Pulse: 68 65  Resp: (!) 7 19  Temp: (!) 36.1 C (!) 36.1 C  SpO2: 99% 97%    Last Pain:  Vitals:   10/12/22 1242  TempSrc:   PainSc: 0-No pain                 Ilene Qua

## 2022-10-12 NOTE — Transfer of Care (Signed)
Immediate Anesthesia Transfer of Care Note  Patient: Stacy Warren  Procedure(s) Performed: CATARACT EXTRACTION PHACO AND INTRAOCULAR LENS PLACEMENT (IOC) LEFT 11.13 01:13.9 (Left: Eye)  Patient Location: PACU  Anesthesia Type:MAC  Level of Consciousness: awake, alert  and oriented  Airway & Oxygen Therapy: Patient Spontanous Breathing  Post-op Assessment: Report given to RN and Post -op Vital signs reviewed and stable  Post vital signs: stable  Last Vitals:  Vitals Value Taken Time  BP    Temp    Pulse 68 10/12/22 1235  Resp    SpO2 99 % 10/12/22 1235  Vitals shown include unvalidated device data.  Last Pain:  Vitals:   10/12/22 1130  TempSrc: Temporal  PainSc: 0-No pain         Complications: No notable events documented.

## 2022-10-12 NOTE — Op Note (Signed)
  OPERATIVE NOTE  Stacy Warren [id number] 10/12/2022   PREOPERATIVE DIAGNOSIS:  Nuclear sclerotic cataract left eye. H25.12   POSTOPERATIVE DIAGNOSIS:    Nuclear sclerotic cataract left eye.     PROCEDURE:  Phacoemusification with posterior chamber intraocular lens placement of the left eye  Ultrasound time: Procedure(s): CATARACT EXTRACTION PHACO AND INTRAOCULAR LENS PLACEMENT (IOC) LEFT 11.13 01:13.9 (Left)  LENS:   Implant Name Type Inv. Item Serial No. Manufacturer Lot No. LRB No. Used Action  LENS IOL TECNIS EYHANCE 21.5 - [id number] Intraocular Lens LENS IOL TECNIS EYHANCE 21.5 [id number] [employer]IGHTPATH  Left 1 Implanted      SURGEON:  Wyonia Hough, MD   ANESTHESIA:  Topical with tetracaine drops and 2% Xylocaine jelly, augmented with 1% preservative-free intracameral lidocaine.    COMPLICATIONS:  None.   DESCRIPTION OF PROCEDURE:  The patient was identified in the holding room and transported to the operating room and placed in the supine position under the operating microscope.  The left eye was identified as the operative eye and it was prepped and draped in the usual sterile ophthalmic fashion.   A 1 millimeter clear-corneal paracentesis was made at the 1:30 position.  0.5 ml of preservative-free 1% lidocaine was injected into the anterior chamber.  The anterior chamber was filled with Viscoat viscoelastic.  A 2.4 millimeter keratome was used to make a near-clear corneal incision at the 10:30 position.  .  A curvilinear capsulorrhexis was made with a cystotome and capsulorrhexis forceps.  Balanced salt solution was used to hydrodissect and hydrodelineate the nucleus.   Phacoemulsification was then used in stop and chop fashion to remove the lens nucleus and epinucleus.  The remaining cortex was then removed using the irrigation and aspiration handpiece. Provisc was then placed into the capsular bag to distend it for lens placement.  A lens was then injected into the  capsular bag.  The remaining viscoelastic was aspirated.   Wounds were hydrated with balanced salt solution.  The anterior chamber was inflated to a physiologic pressure with balanced salt solution.  No wound leaks were noted. Cefuroxime 0.1 ml of a '10mg'$ /ml solution was injected into the anterior chamber for a dose of 1 mg of intracameral antibiotic at the completion of the case.   Timolol and Brimonidine drops were applied to the eye.  The patient was taken to the recovery room in stable condition without complications of anesthesia or surgery.  Tiffane Sheldon 10/12/2022, 12:33 PM

## 2022-10-12 NOTE — H&P (Signed)
[facility]   Primary Care Physician:  Arnetha Courser, MD Ophthalmologist: Dr. Leandrew Koyanagi  Pre-Procedure History & Physical: HPI:  Stacy Warren is a 71 y.o. female here for ophthalmic surgery.   Past Medical History:  Diagnosis Date   Arthritis    Motion sickness    back  seat - cars   Osteopenia 11/28/2018   Pulmonary embolism ([place]) 04/09/2018   Completed course of blood thinner in 2007; after femur fracture and surgery    Past Surgical History:  Procedure Laterality Date   BREAST BIOPSY Right 12/13/2018   stereo bx of mass, coil clip INVASIVE MAMMARY CARCINOMA   BREAST LUMPECTOMY Right 12/31/2018   [facility]   BREAST LUMPECTOMY WITH NEEDLE LOCALIZATION AND AXILLARY SENTINEL LYMPH NODE BX Right 12/31/2018   Procedure: BREAST LUMPECTOMY WITH SENTINEL LYMPH NODE BX, WIRE LOCALIZATION;  Surgeon: Vickie Epley, MD;  Location: [facility] ORS;  Service: General;  Laterality: Right;   bunionectomy Left    [place] OF UTERUS  06/07/2018   HIP PINNING  2007   HYSTEROSCOPY WITH D & C N/A 06/07/2018   Procedure: DILATATION AND CURETTAGE /HYSTEROSCOPY/MYOSURE POLYPECTOMY;  Surgeon: Malachy Mood, MD;  Location: [facility] ORS;  Service: Gynecology;  Laterality: N/A;   LEG SURGERY  2007   plate   TONSILLECTOMY     VAGINAL DELIVERY     x2    Prior to Admission medications   Medication Sig Start Date End Date Taking? Authorizing Provider  acetaminophen (TYLENOL) 650 MG CR tablet Take 650 mg by mouth every 8 (eight) hours as needed for pain.   Yes [provider]  anastrozole (ARIMIDEX) 1 MG tablet Take 1 mg by mouth daily.   Yes [provider]  Biotin 10000 MCG TABS Take by mouth daily.   Yes [provider]  Calcium-Magnesium-Vitamin D (CALCIUM 1200+D3 PO) Take 1 tablet by mouth 2 (two) times a week.   Yes [provider]  chlorpheniramine (CHLOR-TRIMETON) 4 MG tablet Take 4 mg by mouth daily as  needed for allergies.   Yes [provider]  Cholecalciferol (VITAMIN D3) 1000 units CAPS Take 1,000 Units by mouth daily.   Yes [provider]  Coenzyme Q10 (CO Q10 PO) Take by mouth daily.   Yes [provider]  fexofenadine (ALLEGRA) 180 MG tablet Take 180 mg by mouth daily as needed.   Yes [provider]  glucosamine-chondroitin 500-400 MG tablet Take 1 tablet by mouth daily.    Yes [provider]  ibuprofen (ADVIL,MOTRIN) 400 MG tablet Take 1 tablet (400 mg total) by mouth every 6 (six) hours as needed. 06/07/18  Yes Malachy Mood, MD  INTRAROSA 6.5 MG INST PLACE 6.5 MG VAGINALLY AT BEDTIME. 11/02/20  Yes Malachy Mood, MD  Multiple Vitamins-Minerals (CENTRUM SILVER PO) Take 1 tablet by mouth daily.   Yes [provider]  Tetrahydrozoline HCl (VISINE OP) Place 2 drops into both eyes daily as needed (for dry eyes).   Yes [provider]  TURMERIC CURCUMIN PO Take 500 mg by mouth at bedtime.    Yes [provider]  vitamin B-12 (CYANOCOBALAMIN) 1000 MCG tablet Take 1,000 mcg by mouth 4 (four) times a week.    Yes [provider]    Allergies as of 08/24/2022   (No Known Allergies)    Family History  Problem Relation Age of Onset   Breast cancer Maternal Aunt 28  deceased 20s   Breast cancer Maternal Aunt 38       deceased 58s   Alzheimer's disease Mother        deceased 59   Lymphoma Father 58       deceased 16   Heart disease Father    Skin cancer Brother        currently 43   Thyroid disease Son    Breast cancer Paternal Aunt        dx 57s; deceased 64s   Colon cancer Neg Hx     Social History   Socioeconomic History   Marital status: Married    Spouse name: Alroy Dust   Number of children: 2   Years of education: 12   Highest education level: Some college, no degree  Occupational History   Occupation: retired  Tobacco Use   Smoking status: Never   Smokeless tobacco: Never   Vaping Use   Vaping Use: Never used  Substance and Sexual Activity   Alcohol use: No   Drug use: No   Sexual activity: Not Currently  Other Topics Concern   Not on file  Social History Narrative   Not on file   Social Determinants of Health   Financial Resource Strain: Low Risk  (01/23/2019)   Overall Financial Resource Strain (CARDIA)    Difficulty of Paying Living Expenses: Not hard at all  Food Insecurity: No Food Insecurity (01/23/2019)   Hunger Vital Sign    Worried About Running Out of Food in the Last Year: Never true    [place] in the Last Year: Never true  Transportation Needs: No Transportation Needs (01/23/2019)   PRAPARE - Hydrologist (Medical): No    Lack of Transportation (Non-Medical): No  Physical Activity: Inactive (01/23/2019)   Exercise Vital Sign    Days of Exercise per Week: 0 days    Minutes of Exercise per Session: 0 min  Stress: No Stress Concern Present (01/23/2019)   [place]    Feeling of Stress : Only a little  Social Connections: Moderately Integrated (01/23/2019)   Social Connection and Isolation Panel [NHANES]    Frequency of Communication with Friends and Family: Three times a week    Frequency of Social Gatherings with Friends and Family: Three times a week    Attends Religious Services: More than 4 times per year    Active Member of Clubs or Organizations: No    Attends Archivist Meetings: Never    Marital Status: Married  Human resources officer Violence: Not At Risk (01/23/2019)   Humiliation, Afraid, Rape, and Kick questionnaire    Fear of Current or Ex-Partner: No    Emotionally Abused: No    Physically Abused: No    Sexually Abused: No    Review of Systems: See HPI, otherwise negative ROS  Physical Exam: BP (!) 156/84   Pulse 62   Temp 97.8 F (36.6 C) (Temporal)   Ht '5\' 2"'$  (1.575 m)   Wt 92.1 kg   SpO2 95%   BMI 37.13  kg/m  General:   Alert,  pleasant and cooperative in NAD Head:  Normocephalic and atraumatic. Lungs:  Clear to auscultation.    Heart:  Regular rate and rhythm.   Impression/Plan: Stacy Warren is here for ophthalmic surgery.  Risks, benefits, limitations, and alternatives regarding ophthalmic surgery have been reviewed with the patient.  Questions have been answered.  All parties agreeable.   Leandrew Koyanagi, MD  10/12/2022, 11:47 AM

## 2022-10-12 NOTE — Anesthesia Preprocedure Evaluation (Signed)
Anesthesia Evaluation  Patient identified by MRN, date of birth, ID band Patient awake    Reviewed: Allergy & Precautions, NPO status , Patient's Chart, lab work & pertinent test results  History of Anesthesia Complications (+) PONV and history of anesthetic complications  Airway Mallampati: III  TM Distance: >3 FB Neck ROM: full    Dental  (+) Teeth Intact   Pulmonary neg pulmonary ROS,    Pulmonary exam normal        Cardiovascular negative cardio ROS Normal cardiovascular exam     Neuro/Psych negative neurological ROS  negative psych ROS   GI/Hepatic negative GI ROS, Neg liver ROS,   Endo/Other  negative endocrine ROS  Renal/GU      Musculoskeletal   Abdominal   Peds  Hematology negative hematology ROS (+)   Anesthesia Other Findings Past Medical History: No date: Arthritis No date: Motion sickness     Comment:  back  seat - cars 11/28/2018: Osteopenia 04/09/2018: Pulmonary embolism (HCC)     Comment:  Completed course of blood thinner in 2007; after femur               fracture and surgery  Past Surgical History: 12/13/2018: BREAST BIOPSY; Right     Comment:  stereo bx of mass, coil clip INVASIVE MAMMARY CARCINOMA 12/31/2018: BREAST LUMPECTOMY; Right     Comment:  [facility] 12/31/2018: BREAST LUMPECTOMY WITH NEEDLE LOCALIZATION AND AXILLARY  SENTINEL LYMPH NODE BX; Right     Comment:  Procedure: BREAST LUMPECTOMY WITH SENTINEL LYMPH NODE               BX, WIRE LOCALIZATION;  Surgeon: Vickie Epley, MD;                Location: [facility] ORS;  Service: General;  Laterality:               Right; No date: bunionectomy; Left 1984: CERVICAL CONE BIOPSY 06/07/2018: DILATION AND CURETTAGE OF UTERUS 2007: HIP PINNING 06/07/2018: HYSTEROSCOPY WITH D & C; N/A     Comment:  Procedure: DILATATION AND CURETTAGE               /HYSTEROSCOPY/MYOSURE POLYPECTOMY;  Surgeon: Malachy Mood, MD;  Location:  [facility] ORS;  Service: Gynecology;                Laterality: N/A; 2007: LEG SURGERY     Comment:  plate No date: TONSILLECTOMY No date: VAGINAL DELIVERY     Comment:  x2  BMI    Body Mass Index: 36.58 kg/m      Reproductive/Obstetrics negative OB ROS                             Anesthesia Physical Anesthesia Plan  ASA: 2  Anesthesia Plan: MAC   Post-op Pain Management:    Induction: Intravenous  PONV Risk Score and Plan: Midazolam and Treatment may vary due to age or medical condition  Airway Management Planned: Natural Airway and Nasal Cannula  Additional Equipment:   Intra-op Plan:   Post-operative Plan:   Informed Consent: I have reviewed the patients History and Physical, chart, labs and discussed the procedure including the risks, benefits and alternatives for the proposed anesthesia with the patient or authorized representative who has indicated his/her understanding and acceptance.     Dental Advisory Given  Plan Discussed with: Anesthesiologist,  CRNA and Surgeon  Anesthesia Plan Comments: (Patient consented for risks of anesthesia including but not limited to:  - adverse reactions to medications - damage to eyes, teeth, lips or other oral mucosa - nerve damage due to positioning  - sore throat or hoarseness - Damage to heart, brain, nerves, lungs, other parts of body or loss of life  Patient voiced understanding.)        Anesthesia Quick Evaluation

## 2022-10-13 ENCOUNTER — Encounter: Payer: Self-pay | Admitting: Ophthalmology

## 2022-10-19 ENCOUNTER — Encounter: Payer: Self-pay | Admitting: Ophthalmology

## 2022-10-24 NOTE — Discharge Instructions (Signed)

## 2022-10-26 ENCOUNTER — Encounter
Admission: RE | Disposition: A | Discharge status: Home / Self Care | Payer: Self-pay | Source: Home / Self Care | Attending: Ophthalmology

## 2022-10-26 ENCOUNTER — Ambulatory Visit: Payer: Medicare PPO | Admitting: Anesthesiology

## 2022-10-26 ENCOUNTER — Ambulatory Visit
Admission: RE | Admit: 2022-10-26 | Discharge: 2022-10-26 | Disposition: A | Discharge status: Home / Self Care | Payer: Medicare PPO | Attending: Ophthalmology | Admitting: Ophthalmology

## 2022-10-26 ENCOUNTER — Encounter: Payer: Self-pay | Admitting: Ophthalmology

## 2022-10-26 ENCOUNTER — Other Ambulatory Visit: Payer: Self-pay

## 2022-10-26 DIAGNOSIS — I10 Essential (primary) hypertension: Secondary | ICD-10-CM | POA: Insufficient documentation

## 2022-10-26 DIAGNOSIS — H2511 Age-related nuclear cataract, right eye: Secondary | ICD-10-CM | POA: Insufficient documentation

## 2022-10-26 HISTORY — PX: CATARACT EXTRACTION W/PHACO: SHX586

## 2022-10-26 SURGERY — PHACOEMULSIFICATION, CATARACT, WITH IOL INSERTION
Anesthesia: Monitor Anesthesia Care | Site: Eye | Laterality: Right

## 2022-10-26 MED ORDER — SIGHTPATH DOSE#1 BSS IO SOLN
INTRAOCULAR | Status: DC | PRN
  Administered 2022-10-26: 67 mL via OPHTHALMIC

## 2022-10-26 MED ORDER — FENTANYL CITRATE (PF) 100 MCG/2ML IJ SOLN
INTRAMUSCULAR | Status: DC | PRN
  Administered 2022-10-26: 25 ug via INTRAVENOUS

## 2022-10-26 MED ORDER — BRIMONIDINE TARTRATE-TIMOLOL 0.2-0.5 % OP SOLN
OPHTHALMIC | Status: DC | PRN
  Administered 2022-10-26: 1 [drp] via OPHTHALMIC

## 2022-10-26 MED ORDER — SIGHTPATH DOSE#1 NA HYALUR & NA CHOND-NA HYALUR IO KIT
PACK | INTRAOCULAR | Status: DC | PRN
  Administered 2022-10-26: 1 via OPHTHALMIC

## 2022-10-26 MED ORDER — SIGHTPATH DOSE#1 BSS IO SOLN
INTRAOCULAR | Status: DC | PRN
  Administered 2022-10-26: 15 mL

## 2022-10-26 MED ORDER — CEFUROXIME OPHTHALMIC INJECTION 1 MG/0.1 ML
INJECTION | OPHTHALMIC | Status: DC | PRN
  Administered 2022-10-26: 0.1 mL via INTRACAMERAL

## 2022-10-26 MED ORDER — ARMC OPHTHALMIC DILATING DROPS
1.0000 | OPHTHALMIC | Status: DC | PRN
  Administered 2022-10-26 (×3): 1 via OPHTHALMIC

## 2022-10-26 MED ORDER — MIDAZOLAM HCL 2 MG/2ML IJ SOLN
INTRAMUSCULAR | Status: DC | PRN
  Administered 2022-10-26: 1 mg via INTRAVENOUS

## 2022-10-26 MED ORDER — TETRACAINE HCL 0.5 % OP SOLN
1.0000 [drp] | OPHTHALMIC | Status: DC | PRN
  Administered 2022-10-26 (×3): 1 [drp] via OPHTHALMIC

## 2022-10-26 MED ORDER — LACTATED RINGERS IV SOLN: INTRAVENOUS | Status: DC

## 2022-10-26 MED ORDER — FENTANYL CITRATE PF 50 MCG/ML IJ SOSY: 25.0000 ug | PREFILLED_SYRINGE | INTRAMUSCULAR | Status: DC | PRN

## 2022-10-26 MED ORDER — ONDANSETRON HCL 4 MG/2ML IJ SOLN: 4.0000 mg | Freq: Once | INTRAMUSCULAR | Status: DC | PRN

## 2022-10-26 MED ORDER — SIGHTPATH DOSE#1 BSS IO SOLN
INTRAOCULAR | Status: DC | PRN
  Administered 2022-10-26: 1 mL via INTRAMUSCULAR

## 2022-10-26 SURGICAL SUPPLY — 10 items
CATARACT SUITE SIGHTPATH (MISCELLANEOUS) ×1 IMPLANT
FEE CATARACT SUITE SIGHTPATH (MISCELLANEOUS) ×1 IMPLANT
GLOVE SRG 8 PF TXTR STRL LF DI (GLOVE) ×1 IMPLANT
GLOVE SURG ENC TEXT LTX SZ7.5 (GLOVE) ×1 IMPLANT
GLOVE SURG UNDER POLY LF SZ8 (GLOVE) ×1
LENS IOL TECNIS EYHANCE 22.0 (Intraocular Lens) IMPLANT
NDL FILTER BLUNT 18X1 1/2 (NEEDLE) ×1 IMPLANT
NEEDLE FILTER BLUNT 18X1 1/2 (NEEDLE) ×1 IMPLANT
SYR 3ML LL SCALE MARK (SYRINGE) ×1 IMPLANT
WATER STERILE IRR 250ML POUR (IV SOLUTION) ×1 IMPLANT

## 2022-10-26 NOTE — Op Note (Signed)
LOCATION:  [place]   PREOPERATIVE DIAGNOSIS:    Nuclear sclerotic cataract right eye. H25.11   POSTOPERATIVE DIAGNOSIS:  Nuclear sclerotic cataract right eye.     PROCEDURE:  Phacoemusification with posterior chamber intraocular lens placement of the right eye   ULTRASOUND TIME: Procedure(s): CATARACT EXTRACTION PHACO AND INTRAOCULAR LENS PLACEMENT (IOC) RIGHT 12.71 01:14.8 (Right)  LENS:   Implant Name Type Inv. Item Serial No. Manufacturer Lot No. LRB No. Used Action  LENS IOL TECNIS EYHANCE 22.0 - [id number] Intraocular Lens LENS IOL TECNIS EYHANCE 22.0 [id number] [employer]  Right 1 Implanted         SURGEON:  Wyonia Hough, MD   ANESTHESIA:  Topical with tetracaine drops and 2% Xylocaine jelly, augmented with 1% preservative-free intracameral lidocaine.    COMPLICATIONS:  None.   DESCRIPTION OF PROCEDURE:  The patient was identified in the holding room and transported to the operating room and placed in the supine position under the operating microscope.  The right eye was identified as the operative eye and it was prepped and draped in the usual sterile ophthalmic fashion.   A 1 millimeter clear-corneal paracentesis was made at the 12:00 position.  0.5 ml of preservative-free 1% lidocaine was injected into the anterior chamber. The anterior chamber was filled with Viscoat viscoelastic.  A 2.4 millimeter keratome was used to make a near-clear corneal incision at the 9:00 position.  A curvilinear capsulorrhexis was made with a cystotome and capsulorrhexis forceps.  Balanced salt solution was used to hydrodissect and hydrodelineate the nucleus.   Phacoemulsification was then used in stop and chop fashion to remove the lens nucleus and epinucleus.  The remaining cortex was then removed using the irrigation and aspiration handpiece. Provisc was then placed into the capsular bag to distend it for lens placement.  A lens was then injected into the capsular bag.  The  remaining viscoelastic was aspirated.   Wounds were hydrated with balanced salt solution.  The anterior chamber was inflated to a physiologic pressure with balanced salt solution.  No wound leaks were noted. Cefuroxime 0.1 ml of a '10mg'$ /ml solution was injected into the anterior chamber for a dose of 1 mg of intracameral antibiotic at the completion of the case.   Timolol and Brimonidine drops were applied to the eye.  The patient was taken to the recovery room in stable condition without complications of anesthesia or surgery.   Stacy Warren 10/26/2022, 9:56 AM

## 2022-10-26 NOTE — H&P (Signed)
[facility]   Primary Care Physician:  Care, [place] Primary Ophthalmologist: Dr. Leandrew Koyanagi  Pre-Procedure History & Physical: HPI:  Stacy Warren is a 71 y.o. female here for ophthalmic surgery.   Past Medical History:  Diagnosis Date   Arthritis    Motion sickness    back  seat - cars   Osteopenia 11/28/2018   Pulmonary embolism ([place]) 04/09/2018   Completed course of blood thinner in 2007; after femur fracture and surgery    Past Surgical History:  Procedure Laterality Date   BREAST BIOPSY Right 12/13/2018   stereo bx of mass, coil clip INVASIVE MAMMARY CARCINOMA   BREAST LUMPECTOMY Right 12/31/2018   [facility]   BREAST LUMPECTOMY WITH NEEDLE LOCALIZATION AND AXILLARY SENTINEL LYMPH NODE BX Right 12/31/2018   Procedure: BREAST LUMPECTOMY WITH SENTINEL LYMPH NODE BX, WIRE LOCALIZATION;  Surgeon: Vickie Epley, MD;  Location: [facility] OR[facility];  Service: General;  Laterality: Right;   bunionectomy Left    CATARACT EXTRACTION W/PHACO Left 10/12/2022   Procedure: CATARACT EXTRACTION PHACO AND INTRAOCULAR LENS PLACEMENT ([place]) LEFT 11.13 01:13.9;  Surgeon: Leandrew Koyanagi, MD;  Location: [place];  Service: Ophthalmology;  Laterality: Left;   CERVICAL CONE BIOPSY  1984   DILATION AND CURETTAGE OF UTERUS  06/07/2018   HIP PINNING  2007   HYSTEROSCOPY WITH D & C N/A 06/07/2018   Procedure: DILATATION AND CURETTAGE /HYSTEROSCOPY/MYOSURE POLYPECTOMY;  Surgeon: Malachy Mood, MD;  Location: [facility];  Service: Gynecology;  Laterality: N/A;   LEG SURGERY  2007   plate   TONSILLECTOMY     VAGINAL DELIVERY     x2    Prior to Admission medications   Medication Sig Start Date End Date Taking? Authorizing Provider  acetaminophen (TYLENOL) 650 MG CR tablet Take 650 mg by mouth every 8 (eight) hours as needed for pain.   Yes [provider]  anastrozole (ARIMIDEX) 1 MG tablet Take 1 mg by mouth daily.   Yes [provider]  Biotin 10000 MCG  TABS Take by mouth daily.   Yes [provider]  Calcium-Magnesium-Vitamin D (CALCIUM 1200+D3 PO) Take 1 tablet by mouth 2 (two) times a week.   Yes [provider]  chlorpheniramine (CHLOR-TRIMETON) 4 MG tablet Take 4 mg by mouth daily as needed for allergies.   Yes [provider]  Cholecalciferol (VITAMIN D3) 1000 units CAPS Take 1,000 Units by mouth daily.   Yes [provider]  Coenzyme Q10 (CO Q10 PO) Take by mouth daily.   Yes [provider]  fexofenadine (ALLEGRA) 180 MG tablet Take 180 mg by mouth daily as needed.   Yes [provider]  glucosamine-chondroitin 500-400 MG tablet Take 1 tablet by mouth daily.    Yes [provider]  ibuprofen (ADVIL,MOTRIN) 400 MG tablet Take 1 tablet (400 mg total) by mouth every 6 (six) hours as needed. 06/07/18  Yes Malachy Mood, MD  INTRAROSA 6.5 MG INST PLACE 6.5 MG VAGINALLY AT BEDTIME. 11/02/20  Yes Malachy Mood, MD  Multiple Vitamins-Minerals (CENTRUM SILVER PO) Take 1 tablet by mouth daily.   Yes [provider]  Tetrahydrozoline HCl (VISINE OP) Place 2 drops into both eyes daily as needed (for dry eyes).   Yes [provider]  TURMERIC CURCUMIN PO Take 500 mg by mouth at bedtime.    Yes [provider]  vitamin B-12 (CYANOCOBALAMIN) 1000 MCG tablet Take 1,000 mcg by mouth 4 (four) times a week.    Yes [provider]    Allergies as of 08/24/2022   (No Known Allergies)    Family History  Problem Relation Age of Onset   Breast cancer Maternal Aunt 35       deceased 57s   Breast cancer Maternal Aunt 68       deceased 65s   Alzheimer's disease Mother        deceased 75   Lymphoma Father 75       deceased 20   Heart disease Father    Skin cancer Brother        currently 51   Thyroid disease Son    Breast cancer Paternal Aunt        dx 81s; deceased 43s   Colon cancer Neg Hx     Social History   Socioeconomic History    Marital status: Married    Spouse name: Alroy Dust   Number of children: 2   Years of education: 12   Highest education level: Some college, no degree  Occupational History   Occupation: retired  Tobacco Use   Smoking status: Never   Smokeless tobacco: Never  Vaping Use   Vaping Use: Never used  Substance and Sexual Activity   Alcohol use: No   Drug use: No   Sexual activity: Not Currently  Other Topics Concern   Not on file  Social History Narrative   Not on file   Social Determinants of Health   Financial Resource Strain: Low Risk  (01/23/2019)   Overall Financial Resource Strain (CARDIA)    Difficulty of Paying Living Expenses: Not hard at all  Food Insecurity: No Food Insecurity (01/23/2019)   Hunger Vital Sign    Worried About Running Out of Food in the Last Year: Never true    [place] in the Last Year: Never true  Transportation Needs: No Transportation Needs (01/23/2019)   PRAPARE - Hydrologist (Medical): No    Lack of Transportation (Non-Medical): No  Physical Activity: Inactive (01/23/2019)   Exercise Vital Sign    Days of Exercise per Week: 0 days    Minutes of Exercise per Session: 0 min  Stress: No Stress Concern Present (01/23/2019)   [place]    Feeling of Stress : Only a little  Social Connections: Moderately Integrated (01/23/2019)   Social Connection and Isolation Panel [NHANES]    Frequency of Communication with Friends and Family: Three times a week    Frequency of Social Gatherings with Friends and Family: Three times a week    Attends Religious Services: More than 4 times per year    Active Member of Clubs or Organizations: No    Attends Archivist Meetings: Never    Marital Status: Married  Human resources officer Violence: Not At Risk (01/23/2019)   Humiliation, Afraid, Rape, and Kick questionnaire    Fear of Current or Ex-Partner: No     Emotionally Abused: No    Physically Abused: No    Sexually Abused: No    Review of Systems: See HPI, otherwise negative ROS  Physical Exam: BP (!) 157/91   Pulse 65   Temp (!) 97.2 F (36.2 C) (Temporal)   Resp 20   Wt 92.5 kg   SpO2 95%   BMI 37.31 kg/m  General:   Alert,  pleasant and cooperative in NAD Head:  Normocephalic and atraumatic. Lungs:  Clear to auscultation.    Heart:  Regular rate and rhythm.   Impression/Plan: [name] is here for ophthalmic surgery.  Risks, benefits, limitations, and alternatives regarding ophthalmic surgery have been reviewed with the patient.  Questions have been answered.  All parties agreeable.   Leandrew Koyanagi, MD  10/26/2022, 9:23 AM

## 2022-10-26 NOTE — Anesthesia Postprocedure Evaluation (Signed)
Anesthesia Post Note  Patient: Stacy Warren  Procedure(s) Performed: CATARACT EXTRACTION PHACO AND INTRAOCULAR LENS PLACEMENT ([facility]) RIGHT 12.71 01:14.8 (Right: Eye)  Patient location during evaluation: PACU Anesthesia Type: MAC Level of consciousness: awake and alert Pain management: pain level controlled Vital Signs Assessment: post-procedure vital signs reviewed and stable Respiratory status: spontaneous breathing, nonlabored ventilation, respiratory function stable and patient connected to nasal cannula oxygen Cardiovascular status: stable and blood pressure returned to baseline Postop Assessment: no apparent nausea or vomiting Anesthetic complications: no   No notable events documented.   Last Vitals:  Vitals:   10/26/22 1000 10/26/22 1005  BP: 132/80 133/80  Pulse: 65 62  Resp: 15 16  Temp:    SpO2: 96% 96%    Last Pain:  Vitals:   10/26/22 1005  TempSrc:   PainSc: 0-No pain                 Molli Barrows

## 2022-10-26 NOTE — Anesthesia Preprocedure Evaluation (Signed)
Anesthesia Evaluation  Patient identified by MRN, date of birth, ID band Patient awake    Reviewed: Allergy & Precautions, H&P , NPO status , Patient's Chart, lab work & pertinent test results, reviewed documented beta blocker date and time   Airway Mallampati: II  TM Distance: >3 FB Neck ROM: full    Dental no notable dental hx. (+) Teeth Intact   Pulmonary neg pulmonary ROS,    Pulmonary exam normal breath sounds clear to auscultation       Cardiovascular Exercise Tolerance: Good hypertension, On Medications negative cardio ROS   Rhythm:regular Rate:Normal     Neuro/Psych negative neurological ROS  negative psych ROS   GI/Hepatic negative GI ROS, Neg liver ROS,   Endo/Other  negative endocrine ROSdiabetes, Well Controlled  Renal/GU Renal disease     Musculoskeletal   Abdominal   Peds  Hematology negative hematology ROS (+)   Anesthesia Other Findings   Reproductive/Obstetrics negative OB ROS                             Anesthesia Physical Anesthesia Plan  ASA: 2  Anesthesia Plan: MAC   Post-op Pain Management:    Induction:   PONV Risk Score and Plan: 3  Airway Management Planned:   Additional Equipment:   Intra-op Plan:   Post-operative Plan:   Informed Consent: I have reviewed the patients History and Physical, chart, labs and discussed the procedure including the risks, benefits and alternatives for the proposed anesthesia with the patient or authorized representative who has indicated his/her understanding and acceptance.       Plan Discussed with: CRNA  Anesthesia Plan Comments:         Anesthesia Quick Evaluation

## 2022-10-26 NOTE — Transfer of Care (Signed)
Immediate Anesthesia Transfer of Care Note  Patient: Stacy Warren  Procedure(s) Performed: CATARACT EXTRACTION PHACO AND INTRAOCULAR LENS PLACEMENT ([facility]) RIGHT 12.71 01:14.8 (Right: Eye)  Patient Location: PACU  Anesthesia Type: MAC  Level of Consciousness: awake, alert  and patient cooperative  Airway and Oxygen Therapy: Patient Spontanous Breathing and Patient connected to supplemental oxygen  Post-op Assessment: Post-op Vital signs reviewed, Patient's Cardiovascular Status Stable, Respiratory Function Stable, Patent Airway and No signs of Nausea or vomiting  Post-op Vital Signs: Reviewed and stable  Complications: No notable events documented.

## 2022-10-27 ENCOUNTER — Encounter: Payer: Self-pay | Admitting: Ophthalmology

## 2023-06-13 LAB — HM MAMMOGRAPHY

## 2023-11-21 ENCOUNTER — Encounter: Payer: Self-pay | Admitting: Pediatrics

## 2023-11-21 ENCOUNTER — Ambulatory Visit: Payer: Medicare PPO | Admitting: Pediatrics

## 2023-11-21 VITALS — BP 119/81 | HR 67 | Temp 98.3°F | Ht 62.5 in | Wt 200.8 lb

## 2023-11-21 DIAGNOSIS — Z1211 Encounter for screening for malignant neoplasm of colon: Secondary | ICD-10-CM | POA: Diagnosis not present

## 2023-11-21 DIAGNOSIS — R1011 Right upper quadrant pain: Secondary | ICD-10-CM

## 2023-11-21 DIAGNOSIS — Z7689 Persons encountering health services in other specified circumstances: Secondary | ICD-10-CM | POA: Diagnosis not present

## 2023-11-21 DIAGNOSIS — N8111 Cystocele, midline: Secondary | ICD-10-CM | POA: Diagnosis not present

## 2023-11-21 DIAGNOSIS — Z1330 Encounter for screening examination for mental health and behavioral disorders, unspecified: Secondary | ICD-10-CM | POA: Diagnosis not present

## 2023-11-21 NOTE — Progress Notes (Unsigned)
Establish Care Visit  BP 119/81   Pulse 67   Temp 98.3 F (36.8 C) (Oral)   Ht 5' 2.5" (1.588 m)   Wt 200 lb 12.8 oz (91.1 kg)   SpO2 97%   BMI 36.14 kg/m    Subjective:    Patient ID: Stacy Warren, female    DOB: 14-Jul-1951, 72 y.o.   MRN: [id number]  HPI: Stacy Warren is a 72 y.o. female  Chief Complaint  Patient presents with   Establish Care    Discussed the use of AI scribe software for clinical note transcription with the patient, who gave verbal consent to proceed.  History of Present Illness   The patient, with a history of breast cancer, presents to establish care. She reports being generally healthy and has been keeping up with cancer screenings, including a Cologuard test in 2022. She was recommended for a colonoscopy last year but was unable to schedule due to personal circumstances. The patient also reports a history of bladder issues, having previously used a pessary which fell out shortly after insertion. She has not had a pessary in place since.  The patient also has a history of osteoporosis and is due for a bone scan in June. She was previously treated for breast cancer with a lobectomy and chemotherapy. She reports occasional discomfort in the area of the liver, but denies any associated nausea or vomiting. The patient is also the primary caregiver for her spouse and does not have any home health assistance. She has two sons who are available and willing to help when needed.      Relevant past medical, surgical, family and social history reviewed and updated as indicated. Interim medical history since our last visit reviewed. Allergies and medications reviewed and updated.  ROS per HPI unless specifically indicated above     Objective:    BP 119/81   Pulse 67   Temp 98.3 F (36.8 C) (Oral)   Ht 5' 2.5" (1.588 m)   Wt 200 lb 12.8 oz (91.1 kg)   SpO2 97%   BMI 36.14 kg/m   Wt Readings from Last 3 Encounters:  11/21/23 200 lb 12.8 oz (91.1  kg)  10/26/22 204 lb (92.5 kg)  10/12/22 203 lb (92.1 kg)     Physical Exam Constitutional:      Appearance: Normal appearance.  HENT:     Head: Normocephalic and atraumatic.  Eyes:     Pupils: Pupils are equal, round, and reactive to light.  Cardiovascular:     Rate and Rhythm: Normal rate and regular rhythm.     Pulses: Normal pulses.     Heart sounds: Normal heart sounds.  Pulmonary:     Effort: Pulmonary effort is normal.     Breath sounds: Normal breath sounds.  Abdominal:     General: Abdomen is flat.     Palpations: Abdomen is soft.  Musculoskeletal:        General: Normal range of motion.     Cervical back: Normal range of motion.  Skin:    General: Skin is warm and dry.     Capillary Refill: Capillary refill takes less than 2 seconds.  Neurological:     General: No focal deficit present.     Mental Status: She is alert. Mental status is at baseline.  Psychiatric:        Mood and Affect: Mood normal.        Behavior: Behavior normal.  11/21/2023    2:05 PM 06/18/2019    9:35 AM 01/23/2019    8:14 AM 11/16/2018   10:43 AM 09/24/2018   11:03 AM  Depression screen PHQ 2/9  Decreased Interest 1 0 0 0 0  Down, Depressed, Hopeless 1 0 0 0 0  PHQ - 2 Score 2 0 0 0 0  Altered sleeping 1 0 0 0 0  Tired, decreased energy 1 0 0 0 0  Change in appetite 0 0 0 0 0  Feeling bad or failure about yourself  0 0 0 0 0  Trouble concentrating 0 0 0 0 0  Moving slowly or fidgety/restless 0 0 0 0 0  Suicidal thoughts 0 0 0 0 0  PHQ-9 Score 4 0 0 0 0  Difficult doing work/chores Not difficult at all Not difficult at all Not difficult at all Not difficult at all Not difficult at all       11/21/2023    2:05 PM  GAD 7 : Generalized Anxiety Score  Nervous, Anxious, on Edge 1  Control/stop worrying 1  Worry too much - different things 1  Trouble relaxing 0  Restless 0  Easily annoyed or irritable 0  Afraid - awful might happen 0  Total GAD 7 Score 3  Anxiety  Difficulty Not difficult at all       Assessment & Plan:  Assessment & Plan   Encounter to establish care Reviewed patient record including history, medications, problem list. HM updated as able. Will bring records and will fill HM gaps as needed at follow up visit. Will pool HM records from care everwhere.  Cystocele, midline Assessment & Plan: Pessary fell out and has not been replaced. Discussed the importance of proper fitting and maintenance of pessary for quality of life. -Refer to Urogynecology for pessary fitting and management.  Orders: -     Ambulatory referral to Urogynecology  Screen for colon cancer Positive Cologuard test in 2022 and overdue for colonoscopy. Discussed the importance of colonoscopy for further evaluation. -Schedule colonoscopy within the next 12 months. -     Ambulatory referral to Gastroenterology  Encounter for behavioral health screening As part of their intake evaluation, the patient was screened for depression, anxiety.  PHQ9 SCORE 4, GAD7 SCORE 3. Screening results negative for tested conditions. Continue to monitor  RUQ discomfort Mentioned in passing at end of visit. Will start with labs below. Monitor symptoms and revisit at follow up. Abdominal exam unremarkable.  -     Comprehensive metabolic panel  Follow up plan: Return in about 2 months (around 02/05/2024) for AWV, Physical.  Troy Kanouse Howell Pringle, MD

## 2023-11-21 NOTE — Patient Instructions (Signed)
Good to meet you! Welcome to [facility]!  As your primary care doctor, I look forward to working with you to help you reach your health goals.  Please be aware of a couple of logistical items: - If you message me on mychart, it may take me 1-2 business days to get back to you. This is for non-urgent messaging.  - If you require urgent clinical attention, please call the clinic or present to urgent care/emergency room - If you have labs, I typically will send a message about them in 1-2 business days. - I am not here on Mondays, otherwise will be available from Tuesday-Friday during 8a-5pm.

## 2023-11-22 ENCOUNTER — Encounter: Payer: Self-pay | Admitting: Pediatrics

## 2023-11-22 DIAGNOSIS — N8111 Cystocele, midline: Secondary | ICD-10-CM | POA: Insufficient documentation

## 2023-11-22 LAB — COMPREHENSIVE METABOLIC PANEL
ALT: 18 [IU]/L (ref 0–32)
AST: 21 [IU]/L (ref 0–40)
Albumin: 4.5 g/dL (ref 3.8–4.8)
Alkaline Phosphatase: 84 [IU]/L (ref 44–121)
BUN/Creatinine Ratio: 26 (ref 12–28)
BUN: 20 mg/dL (ref 8–27)
Bilirubin Total: 0.4 mg/dL (ref 0.0–1.2)
CO2: 23 mmol/L (ref 20–29)
Calcium: 10.3 mg/dL (ref 8.7–10.3)
Chloride: 102 mmol/L (ref 96–106)
Creatinine, Ser: 0.77 mg/dL (ref 0.57–1.00)
Globulin, Total: 3 g/dL (ref 1.5–4.5)
Glucose: 100 mg/dL — ABNORMAL HIGH (ref 70–99)
Potassium: 4.4 mmol/L (ref 3.5–5.2)
Sodium: 141 mmol/L (ref 134–144)
Total Protein: 7.5 g/dL (ref 6.0–8.5)
eGFR: 81 mL/min/{1.73_m2} (ref >59)

## 2023-11-22 NOTE — Assessment & Plan Note (Signed)
Pessary fell out and has not been replaced. Discussed the importance of proper fitting and maintenance of pessary for quality of life. -Refer to Urogynecology for pessary fitting and management.

## 2023-11-22 NOTE — Assessment & Plan Note (Signed)
>>  ASSESSMENT AND PLAN FOR CYSTOCELE, MIDLINE WRITTEN ON 11/22/2023  9:46 AM BY SANTOS, MARIA P, MD  Pessary fell out and has not been replaced. Discussed the importance of proper fitting and maintenance of pessary for quality of life. -Refer to Urogynecology for pessary fitting and management.

## 2023-11-27 ENCOUNTER — Telehealth: Payer: Self-pay

## 2023-11-27 NOTE — Telephone Encounter (Signed)
Pt requesting call back to schedule colonoscopy.

## 2023-11-28 ENCOUNTER — Telehealth: Payer: Self-pay

## 2023-11-28 NOTE — Telephone Encounter (Signed)
Returned phone call to patient to schedule her colonoscopy.  LVM for pt to return my call.   Thanks,  Lydia, [place]

## 2023-12-04 ENCOUNTER — Encounter: Payer: Self-pay | Admitting: *Deleted

## 2023-12-25 ENCOUNTER — Other Ambulatory Visit: Payer: Self-pay

## 2023-12-25 ENCOUNTER — Telehealth: Payer: Self-pay

## 2023-12-25 DIAGNOSIS — Z1211 Encounter for screening for malignant neoplasm of colon: Secondary | ICD-10-CM

## 2023-12-25 MED ORDER — NA SULFATE-K SULFATE-MG SULF 17.5-3.13-1.6 GM/177ML PO SOLN: 1.0000 | Freq: Once | ORAL | 0 refills | Status: AC

## 2023-12-25 NOTE — Telephone Encounter (Signed)
The patient called in and left a voicemail to schedule her colonoscopy. I called the patient back to let her know we got her message and no answer I left a voicemail for her to call us back.

## 2023-12-25 NOTE — Telephone Encounter (Signed)
Gastroenterology Pre-Procedure Review  Request Date: 01/23/24 Requesting Physician: Dr. Allegra Lai  PATIENT REVIEW QUESTIONS: The patient responded to the following health history questions as indicated:    1. Are you having any GI issues? no 2. Do you have a personal history of Polyps? no 3. Do you have a family history of Colon Cancer or Polyps? no 4. Diabetes Mellitus? no 5. Joint replacements in the past 12 months?no 6. Major health problems in the past 3 months?no 7. Any artificial heart valves, MVP, or defibrillator?no    MEDICATIONS & ALLERGIES:    Patient reports the following regarding taking any anticoagulation/antiplatelet therapy:   Plavix, Coumadin, Eliquis, Xarelto, Lovenox, Pradaxa, Brilinta, or Effient? no Aspirin? no  Patient confirms/reports the following medications:  Current Outpatient Medications  Medication Sig Dispense Refill   acetaminophen (TYLENOL) 650 MG CR tablet Take 650 mg by mouth every 8 (eight) hours as needed for pain.     anastrozole (ARIMIDEX) 1 MG tablet Take 1 mg by mouth daily.     Biotin 11914 MCG TABS Take 1 tablet by mouth daily.     Calcium-Magnesium-Vitamin D (CALCIUM 1200+D3 PO) Take 1 tablet by mouth daily.     Cholecalciferol (VITAMIN D3) 1000 units CAPS Take 1,000 Units by mouth daily.     Coenzyme Q10 (CO Q10 PO) Take 1 tablet by mouth daily.     glucosamine-chondroitin 500-400 MG tablet Take 1 tablet by mouth daily.      ibuprofen (ADVIL,MOTRIN) 400 MG tablet Take 1 tablet (400 mg total) by mouth every 6 (six) hours as needed. 30 tablet 0   INTRAROSA 6.5 MG INST PLACE 6.5 MG VAGINALLY AT BEDTIME. 28 each 11   Multiple Vitamins-Minerals (CENTRUM SILVER PO) Take 1 tablet by mouth daily.     Tetrahydrozoline HCl (VISINE OP) Place 2 drops into both eyes daily as needed (for dry eyes).     TURMERIC CURCUMIN PO Take 500 mg by mouth at bedtime.      vitamin B-12 (CYANOCOBALAMIN) 1000 MCG tablet Take 1,000 mcg by mouth daily.     No current  facility-administered medications for this visit.    Patient confirms/reports the following allergies:  No Known Allergies  No orders of the defined types were placed in this encounter.   AUTHORIZATION INFORMATION Primary Insurance: 1D#: Group #:  Secondary Insurance: 1D#: Group #:  SCHEDULE INFORMATION: Date: 01/23/24 Time: Location: [facility]

## 2024-01-01 ENCOUNTER — Ambulatory Visit: Payer: Medicare PPO | Admitting: Urology

## 2024-01-09 ENCOUNTER — Encounter: Payer: Self-pay | Admitting: Gastroenterology

## 2024-01-16 LAB — HM DEXA SCAN

## 2024-01-19 ENCOUNTER — Telehealth: Payer: Self-pay

## 2024-01-19 NOTE — Telephone Encounter (Signed)
Patient contacted office to make sure it was okay for her to continue taking her chemo medications.  Informed her that she could continue with her chemo meds and asked her to stop her vitamins and supplements 5 days prior.  Thanks,  Cook, [place]

## 2024-01-23 ENCOUNTER — Other Ambulatory Visit: Payer: Self-pay

## 2024-01-23 ENCOUNTER — Encounter: Payer: Self-pay | Admitting: Gastroenterology

## 2024-01-23 ENCOUNTER — Ambulatory Visit
Admission: RE | Admit: 2024-01-23 | Discharge: 2024-01-23 | Disposition: A | Discharge status: Home / Self Care | Payer: Medicare PPO | Attending: Gastroenterology | Admitting: Gastroenterology

## 2024-01-23 ENCOUNTER — Ambulatory Visit: Payer: Medicare PPO | Admitting: Anesthesiology

## 2024-01-23 ENCOUNTER — Encounter
Admission: RE | Disposition: A | Discharge status: Home / Self Care | Payer: Self-pay | Source: Home / Self Care | Attending: Gastroenterology

## 2024-01-23 ENCOUNTER — Ambulatory Visit: Payer: Medicare Other | Admitting: Nurse Practitioner

## 2024-01-23 DIAGNOSIS — K644 Residual hemorrhoidal skin tags: Secondary | ICD-10-CM | POA: Diagnosis not present

## 2024-01-23 DIAGNOSIS — K635 Polyp of colon: Secondary | ICD-10-CM

## 2024-01-23 DIAGNOSIS — Z1211 Encounter for screening for malignant neoplasm of colon: Secondary | ICD-10-CM | POA: Diagnosis present

## 2024-01-23 DIAGNOSIS — K5730 Diverticulosis of large intestine without perforation or abscess without bleeding: Secondary | ICD-10-CM

## 2024-01-23 DIAGNOSIS — K639 Disease of intestine, unspecified: Secondary | ICD-10-CM | POA: Diagnosis not present

## 2024-01-23 DIAGNOSIS — Z4689 Encounter for fitting and adjustment of other specified devices: Secondary | ICD-10-CM

## 2024-01-23 HISTORY — PX: COLONOSCOPY WITH PROPOFOL: SHX5780

## 2024-01-23 HISTORY — PX: BIOPSY: SHX5522

## 2024-01-23 SURGERY — COLONOSCOPY WITH PROPOFOL
Anesthesia: General | Site: Rectum

## 2024-01-23 MED ORDER — LIDOCAINE HCL (CARDIAC) PF 100 MG/5ML IV SOSY
PREFILLED_SYRINGE | INTRAVENOUS | Status: DC | PRN
  Administered 2024-01-23: 50 mg via INTRAVENOUS

## 2024-01-23 MED ORDER — PROPOFOL 10 MG/ML IV BOLUS
INTRAVENOUS | Status: DC | PRN
  Administered 2024-01-23: 30 mg via INTRAVENOUS
  Administered 2024-01-23: 80 mg via INTRAVENOUS
  Administered 2024-01-23 (×3): 30 mg via INTRAVENOUS
  Administered 2024-01-23 (×3): 20 mg via INTRAVENOUS
  Administered 2024-01-23 (×2): 30 mg via INTRAVENOUS
  Administered 2024-01-23 (×4): 20 mg via INTRAVENOUS

## 2024-01-23 MED ORDER — SODIUM CHLORIDE 0.9 % IV SOLN: INTRAVENOUS | Status: DC

## 2024-01-23 MED ORDER — LACTATED RINGERS IV SOLN: INTRAVENOUS | Status: DC

## 2024-01-23 MED ORDER — STERILE WATER FOR IRRIGATION IR SOLN
Status: DC | PRN
  Administered 2024-01-23: 1

## 2024-01-23 SURGICAL SUPPLY — 21 items
CLIP HMST 235XBRD CATH ROT (MISCELLANEOUS) IMPLANT
ELECT REM PT RETURN 9FT ADLT (ELECTROSURGICAL)
ELECTRODE REM PT RTRN 9FT ADLT (ELECTROSURGICAL) IMPLANT
FCP ESCP3.2XJMB 240X2.8X (MISCELLANEOUS)
FORCEPS BIOP RAD 4 LRG CAP 4 (CUTTING FORCEPS) IMPLANT
FORCEPS ESCP3.2XJMB 240X2.8X (MISCELLANEOUS) IMPLANT
GOWN CVR UNV OPN BCK APRN NK (MISCELLANEOUS) ×4 IMPLANT
INJECTOR VARIJECT VIN23 (MISCELLANEOUS) IMPLANT
KIT DEFENDO VALVE AND CONN (KITS) IMPLANT
KIT PRC NS LF DISP ENDO (KITS) ×2 IMPLANT
MANIFOLD NEPTUNE II (INSTRUMENTS) ×2 IMPLANT
MARKER SPOT ENDO TATTOO 5ML (MISCELLANEOUS) IMPLANT
PROBE APC STR FIRE (PROBE) IMPLANT
RETRIEVER NET ROTH 2.5X230 LF (MISCELLANEOUS) IMPLANT
SNARE COLD EXACTO (MISCELLANEOUS) IMPLANT
SNARE SHORT THROW 13M SML OVAL (MISCELLANEOUS) IMPLANT
SNARE SHORT THROW 30M LRG OVAL (MISCELLANEOUS) IMPLANT
SNARE SNG USE RND 15MM (INSTRUMENTS) IMPLANT
TRAP ETRAP POLY (MISCELLANEOUS) IMPLANT
VARIJECT INJECTOR VIN23 (MISCELLANEOUS)
WATER STERILE IRR 250ML POUR (IV SOLUTION) ×2 IMPLANT

## 2024-01-23 NOTE — Transfer of Care (Signed)
Immediate Anesthesia Transfer of Care Note  Patient: Stacy Warren  Procedure(s) Performed: COLONOSCOPY WITH PROPOFOL (Rectum) BIOPSY (Rectum)  Patient Location: PACU  Anesthesia Type: General  Level of Consciousness: awake, alert  and patient cooperative  Airway and Oxygen Therapy: Patient Spontanous Breathing and Patient connected to supplemental oxygen  Post-op Assessment: Post-op Vital signs reviewed, Patient's Cardiovascular Status Stable, Respiratory Function Stable, Patent Airway and No signs of Nausea or vomiting  Post-op Vital Signs: Reviewed and stable  Complications: No notable events documented.

## 2024-01-23 NOTE — Anesthesia Postprocedure Evaluation (Signed)
Anesthesia Post Note  Patient: Stacy Warren  Procedure(s) Performed: COLONOSCOPY WITH PROPOFOL ([facility]) BIOPSY (Rectum)  Patient location during evaluation: PACU Anesthesia Type: General Level of consciousness: awake and alert Pain management: pain level controlled Vital Signs Assessment: post-procedure vital signs reviewed and stable Respiratory status: spontaneous breathing, nonlabored ventilation, respiratory function stable and patient connected to nasal cannula oxygen Cardiovascular status: blood pressure returned to baseline and stable Postop Assessment: no apparent nausea or vomiting Anesthetic complications: no   No notable events documented.   Last Vitals:  Vitals:   01/23/24 0911 01/23/24 0916  BP: 124/73 135/62  Pulse: 67 71  Resp: (!) 23 (!) 23  Temp: (!) 36.4 C (!) 36.3 C  SpO2: 98% 100%    Last Pain:  Vitals:   01/23/24 0916  TempSrc:   PainSc: 0-No pain                 Marisue Humble

## 2024-01-23 NOTE — Anesthesia Preprocedure Evaluation (Signed)
Anesthesia Evaluation  Patient identified by MRN, date of birth, ID band Patient awake    Reviewed: Allergy & Precautions, H&P , NPO status , Patient's Chart, lab work & pertinent test results  Airway Mallampati: II  TM Distance: >3 FB Neck ROM: Full    Dental no notable dental hx.    Pulmonary neg pulmonary ROS   Pulmonary exam normal breath sounds clear to auscultation       Cardiovascular negative cardio ROS Normal cardiovascular exam Rhythm:Regular Rate:Normal     Neuro/Psych negative neurological ROS  negative psych ROS   GI/Hepatic negative GI ROS, Neg liver ROS,,,  Endo/Other  negative endocrine ROS    Renal/GU Renal diseasenegative Renal ROS  negative genitourinary   Musculoskeletal negative musculoskeletal ROS (+) Arthritis ,    Abdominal   Peds negative pediatric ROS (+)  Hematology negative hematology ROS (+)   Anesthesia Other Findings Hx Pulmonary embolism (HCC) Arthritis Osteopenia  Motion sickness Cancer  Breast cancer (HCC)    Reproductive/Obstetrics negative OB ROS                              Anesthesia Physical Anesthesia Plan  ASA: 3  Anesthesia Plan: General   Post-op Pain Management:    Induction: Intravenous  PONV Risk Score and Plan:   Airway Management Planned: Natural Airway and Nasal Cannula  Additional Equipment:   Intra-op Plan:   Post-operative Plan:   Informed Consent: I have reviewed the patients History and Physical, chart, labs and discussed the procedure including the risks, benefits and alternatives for the proposed anesthesia with the patient or authorized representative who has indicated his/her understanding and acceptance.     Dental Advisory Given  Plan Discussed with: Anesthesiologist, CRNA and Surgeon  Anesthesia Plan Comments: (Patient consented for risks of anesthesia including but not limited to:  - adverse  reactions to medications - risk of airway placement if required - damage to eyes, teeth, lips or other oral mucosa - nerve damage due to positioning  - sore throat or hoarseness - Damage to heart, brain, nerves, lungs, other parts of body or loss of life  Patient voiced understanding and assent.)         Anesthesia Quick Evaluation

## 2024-01-23 NOTE — Op Note (Signed)
[facility] Gastroenterology Patient Name: Stacy Warren Procedure Date: 01/23/2024 8:14 AM MRN: [id number] Account #: [id number] Date of Birth: 1950/12/31 Admit Type: Outpatient Age: 73 Room: [facility] OR ROOM 01 Gender: Female Note Status: Finalized Instrument Name: [id number] Procedure:             Colonoscopy Indications:           Screening for colorectal malignant neoplasm, Last                         colonoscopy 10 years ago Providers:             Toney Reil MD, MD Medicines:             General Anesthesia Complications:         No immediate complications. Estimated blood loss: None. Procedure:             Pre-Anesthesia Assessment:                        - Prior to the procedure, a History and Physical was                         performed, and patient medications and allergies were                         reviewed. The patient is competent. The risks and                         benefits of the procedure and the sedation options and                         risks were discussed with the patient. All questions                         were answered and informed consent was obtained.                         Patient identification and proposed procedure were                         verified by the physician, the nurse, the                         anesthesiologist, the anesthetist and the technician                         in the pre-procedure area in the procedure room in the                         endoscopy suite. Mental Status Examination: alert and                         oriented. Airway Examination: normal oropharyngeal                         airway and neck mobility. Respiratory Examination:                         clear to auscultation. CV Examination:  normal.                         Prophylactic Antibiotics: The patient does not require                         prophylactic antibiotics. Prior Anticoagulants: The                         patient has  taken no anticoagulant or antiplatelet                         agents. ASA Grade Assessment: III - A patient with                         severe systemic disease. After reviewing the risks and                         benefits, the patient was deemed in satisfactory                         condition to undergo the procedure. The anesthesia                         plan was to use general anesthesia. Immediately prior                         to administration of medications, the patient was                         re-assessed for adequacy to receive sedatives. The                         heart rate, respiratory rate, oxygen saturations,                         blood pressure, adequacy of pulmonary ventilation, and                         response to care were monitored throughout the                         procedure. The physical status of the patient was                         re-assessed after the procedure.                        After obtaining informed consent, the colonoscope was                         passed under direct vision. Throughout the procedure,                         the patient's blood pressure, pulse, and oxygen                         saturations were monitored continuously. The  Colonoscope was introduced through the anus and                         advanced to the the cecum, identified by appendiceal                         orifice and ileocecal valve. The colonoscopy was                         extremely difficult due to multiple diverticula in the                         colon, bowel stenosis, restricted mobility of the                         colon, significant looping and the patient's body                         habitus. Successful completion of the procedure was                         aided by withdrawing the scope and replacing with the                         pediatric colonoscope, applying abdominal pressure and                          lavage. The patient tolerated the procedure well. The                         quality of the bowel preparation was good and adequate                         to identify polyps greater than 5 mm in size. The                         ileocecal valve, appendiceal orifice, and rectum were                         photographed. Findings:      The perianal and digital rectal examinations were normal. Pertinent       negatives include normal sphincter tone and no palpable rectal lesions.      Multiple large-mouthed diverticula were found in the recto-sigmoid colon       and sigmoid colon. There was narrowing of the colon in association with       the diverticular opening. There was no evidence of diverticular bleeding.      A polypoid lesion was found in the recto-sigmoid colon. The lesion was       sessile. No bleeding was present. Biopsies were taken with a cold       forceps for histology.      Non-bleeding external hemorrhoids were found during retroflexion. The       hemorrhoids were medium-sized. Impression:            - Severe diverticulosis in the recto-sigmoid colon and  in the sigmoid colon. There was narrowing of the colon                         in association with the diverticular opening. There                         was no evidence of diverticular bleeding.                        - Benign polypoid lesion in the recto-sigmoid colon.                         Biopsied.                        - Non-bleeding external hemorrhoids. Recommendation:        - Discharge patient to home (with escort).                        - Resume previous diet today.                        - Continue present medications.                        - Await pathology results.                        - Repeat colonoscopy date to be determined after                         pending pathology results are reviewed for                         surveillance based on pathology results. Procedure  Code(s):     --- Professional ---                        [phone], Colonoscopy, flexible; with biopsy, single or                         multiple Diagnosis Code(s):     --- Professional ---                        Z12.11, Encounter for screening for malignant neoplasm                         of colon                        K64.4, Residual hemorrhoidal skin tags                        D12.7, Benign neoplasm of rectosigmoid junction                        K57.30, Diverticulosis of large intestine without                         perforation or abscess without bleeding CPT copyright 2022 American Medical Association. All rights reserved. The codes documented in this  report are preliminary and upon coder review may  be revised to meet current compliance requirements. Dr. Libby Maw Toney Reil MD, MD 01/23/2024 9:02:38 AM This report has been signed electronically. Number of Addenda: 0 Note Initiated On: 01/23/2024 8:14 AM Scope Withdrawal Time: 0 hours 12 minutes 21 seconds  Total Procedure Duration: 0 hours 33 minutes 15 seconds  Estimated Blood Loss:  Estimated blood loss: none.      [facility]
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
## 2024-01-24 ENCOUNTER — Encounter: Payer: Self-pay | Admitting: Gastroenterology

## 2024-01-24 LAB — SURGICAL PATHOLOGY

## 2024-01-25 ENCOUNTER — Encounter: Payer: Self-pay | Admitting: Gastroenterology

## 2024-01-30 ENCOUNTER — Encounter: Payer: Self-pay | Admitting: Pediatrics

## 2024-01-30 ENCOUNTER — Ambulatory Visit: Payer: Medicare PPO | Admitting: Pediatrics

## 2024-01-30 VITALS — BP 134/78 | HR 79 | Temp 98.5°F | Resp 15 | Ht 62.36 in | Wt 203.8 lb

## 2024-01-30 DIAGNOSIS — Z13220 Encounter for screening for lipoid disorders: Secondary | ICD-10-CM | POA: Diagnosis not present

## 2024-01-30 DIAGNOSIS — Z1330 Encounter for screening examination for mental health and behavioral disorders, unspecified: Secondary | ICD-10-CM | POA: Diagnosis not present

## 2024-01-30 DIAGNOSIS — Z0000 Encounter for general adult medical examination without abnormal findings: Secondary | ICD-10-CM | POA: Diagnosis not present

## 2024-01-30 DIAGNOSIS — Z131 Encounter for screening for diabetes mellitus: Secondary | ICD-10-CM | POA: Diagnosis not present

## 2024-01-30 NOTE — Progress Notes (Signed)
 BP 134/78 (BP Location: Left Arm, Patient Position: Sitting, Cuff Size: Large)   Pulse 79   Temp 98.5 F (36.9 C) (Oral)   Resp 15   Ht 5' 2.36 (1.584 m)   Wt 203 lb 12.8 oz (92.4 kg)   SpO2 96%   BMI 36.84 kg/m    Annual Physical Exam - Female  Subjective:   CC: Annual Exam   Stacy Warren is a 73 y.o. female patient here for a preventative health maintenance exam and has no acute complaints.  Health Habits: DIET: in general, a healthy diet   EXERCISE: minimal, she is hoping to get back in to swimming, limited by caregiver duties with husband.  DENTAL EXAM: UTD EYE EXAM: UTD                       Relevant Gynecologic History LMP: No LMP recorded. Patient is postmenopausal.  Menstrual Status: postmenopausal, Flow post-menopausal PAP History:  Result Date Procedure Results Follow-ups  06/07/2018 Surgical pathology SURGICAL PATHOLOGY: Surgical Pathology CASE: [id number] PATIENT: DAGOBERTO HARDY Surgical Pathology Report     SPECIMEN SUBMITTED: A. Endometrial polyp and curettings  CLINICAL HISTORY: None provided  PRE-OPERATIVE DIAGNOSIS: Post menopausal bleeding  POS...   02/22/2017 Pap IG and HPV (high risk) DNA detection DIAGNOSIS:: Comment Specimen adequacy:: Comment Clinician Provided ICD10: Comment Performed by:: Comment PAP Smear Comment: . Note:: Comment Test Methodology: Comment HPV, high-risk: Negative     History abnormal PAP: No  Sexual activity: not sexually active Family history breast, ovarian cancer: Yes Domestic Violence Screen, feels safe at home: Yes  family history includes Alzheimer's disease in her mother; Breast cancer in her paternal aunt; Breast cancer (age of onset: 87) in her maternal aunt and maternal aunt; Heart disease in her father; Hypertension in her son; Lymphoma (age of onset: 54) in her father; Skin cancer in her brother; Thyroid disease in her son.  Social History   Tobacco Use   Smoking status: Never    Smokeless tobacco: Never  Vaping Use   Vaping status: Never Used  Substance Use Topics   Alcohol use: No   Drug use: No   Social History   Social History Narrative   Not on file    Social drivers questionnaire is reviewed and is positive for : caregiver stress  Depression Screening:     01/30/2024    1:13 PM 11/21/2023    2:05 PM 06/18/2019    9:35 AM 01/23/2019    8:14 AM 11/16/2018   10:43 AM  Depression screen PHQ 2/9  Decreased Interest 3 1 0 0 0  Down, Depressed, Hopeless 3 1 0 0 0  PHQ - 2 Score 6 2 0 0 0  Altered sleeping 0 1 0 0 0  Tired, decreased energy 1 1 0 0 0  Change in appetite 0 0 0 0 0  Feeling bad or failure about yourself  0 0 0 0 0  Trouble concentrating 0 0 0 0 0  Moving slowly or fidgety/restless 0 0 0 0 0  Suicidal thoughts 0 0 0 0 0  PHQ-9 Score 7 4 0 0 0  Difficult doing work/chores Not difficult at all Not difficult at all Not difficult at all Not difficult at all Not difficult at all       01/30/2024    1:14 PM 11/21/2023    2:05 PM  GAD 7 : Generalized Anxiety Score  Nervous, Anxious, on Edge 1 1  Control/stop  worrying 1 1  Worry too much - different things 1 1  Trouble relaxing 0 0  Restless 0 0  Easily annoyed or irritable 1 0  Afraid - awful might happen 0 0  Total GAD 7 Score 4 3  Anxiety Difficulty Not difficult at all Not difficult at all    Mental Health Plan: continue to monitor  Self Management Goals  Goals   None     Health Maintenance Colon Cancer Screening : Up to Date Mammogram : N/A. H/o breast cancer. Breast cancer , ER positive, Her2 negative. She has been on aromatase inhibitor therapy since 02/2019. Due to finish in march. Repeat mammogram 05/2024. DXA scan : Up to Date Immunizations : UTD, declines covid shingles. Information given.  Review of Systems See HPI for relevant ROS.  Outpatient Medications Prior to Visit  Medication Sig Dispense Refill   acetaminophen  (TYLENOL ) 650 MG CR tablet Take 650 mg by  mouth every 8 (eight) hours as needed for pain.     anastrozole (ARIMIDEX) 1 MG tablet Take 1 mg by mouth daily.     Biotin 89999 MCG TABS Take 1 tablet by mouth daily.     Calcium-Magnesium -Vitamin D (CALCIUM 1200+D3 PO) Take 1 tablet by mouth daily.     Cholecalciferol (VITAMIN D3) 1000 units CAPS Take 1,000 Units by mouth daily.     Coenzyme Q10 (CO Q10 PO) Take 1 tablet by mouth daily.     ibuprofen  (ADVIL ,MOTRIN ) 400 MG tablet Take 1 tablet (400 mg total) by mouth every 6 (six) hours as needed. 30 tablet 0   INTRAROSA  6.5 MG INST PLACE 6.5 MG VAGINALLY AT BEDTIME. (Patient taking differently: Place 6.5 mg vaginally at bedtime. Twice weekly) 28 each 11   Multiple Vitamins-Minerals (CENTRUM SILVER PO) Take 1 tablet by mouth daily.     Tetrahydrozoline HCl (VISINE OP) Place 2 drops into both eyes daily as needed (for dry eyes).     TURMERIC CURCUMIN PO Take 500 mg by mouth at bedtime.      vitamin B-12 (CYANOCOBALAMIN) 1000 MCG tablet Take 1,000 mcg by mouth daily.     glucosamine-chondroitin 500-400 MG tablet Take 1 tablet by mouth daily.      No facility-administered medications prior to visit.     Patient Active Problem List   Diagnosis Date Noted   Mucosal abnormality of colon 01/23/2024   Diverticulosis of sigmoid colon 01/23/2024   Encounter for screening colonoscopy 01/23/2024   Cystocele, midline 11/22/2023   Pelvic organ prolapse quantification stage 1 cystocele 02/07/2020   Obesity (BMI 35.0-39.9 without comorbidity) 01/23/2019   Routine general medical examination at a health care facility 01/23/2019   Malignant neoplasm of lower-inner quadrant of right breast of female, estrogen receptor positive (HCC) 12/22/2018   Osteopenia 11/28/2018   Osteoarthritis 09/24/2018   Chronic kidney disease (CKD), 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  Physical Exam Constitutional:      Appearance: Normal appearance.  HENT:     Head: Normocephalic and atraumatic.  Eyes:  Pupils: Pupils are equal, round, and reactive to light.  Cardiovascular:     Rate and Rhythm: Normal rate and regular rhythm.     Pulses: Normal pulses.     Heart sounds: Normal heart sounds.  Pulmonary:     Effort: Pulmonary effort is normal.     Breath sounds: Normal breath sounds.  Abdominal:     General: Abdomen is flat.     Palpations: Abdomen is soft.  Musculoskeletal:        General: Normal range of motion.     Cervical back: Normal range of motion.  Skin:    General: Skin is warm and dry.     Capillary Refill: Capillary refill takes less than 2 seconds.  Neurological:     General: No focal deficit present.     Mental Status: She is alert. Mental status is at baseline.  Psychiatric:        Mood and Affect: Mood normal.        Behavior: Behavior normal.    Assessment and Plan:   Annual physical exam Discussed lifestyle modifications and goals including plant based eating styles (such as: Mediterranean eating style), regular exercise (at least 150 min of moderate-intensity aerobic exercise per week, given AHA workout handout), get adequate sleep, and continue working with PCP towards meeting health goals to ensure healthy aging.   Diabetes mellitus screening -     Hemoglobin A1c  Lipid screening -     Lipid panel  Encounter for behavioral health screening As part of their intake evaluation, the patient was screened for depression, anxiety.  PHQ9 SCORE 7, GAD7 SCORE 4. Screening results negative for tested conditions after discussion. Some stress around caregiving for husband.  CTM.  Sent message to CMA team to schedule AWV.  This plan was discussed with the patient and questions were answered. There were no further concerns.  Follow up as indicated, or sooner should any new problems arise, if conditions worsen, or if they are otherwise concerned.   See patient instructions for additional information.  Jedi Catalfamo SHAUNNA NETT, MD  Family Medicine      Future Appointments  Date Time Provider Department Center  03/04/2024  1:30 PM Gaston Hamilton, MD [facility] None  05/23/2024 10:40 AM Edman Marsa PARAS, DO [facility]  07/30/2024  8:00 AM Nett Hadassah SHAUNNA, MD [facility]

## 2024-01-31 LAB — LIPID PANEL
Chol/HDL Ratio: 3.1 {ratio} (ref 0.0–4.4)
Cholesterol, Total: 167 mg/dL (ref 100–199)
HDL: 54 mg/dL (ref >39)
LDL Chol Calc (NIH): 89 mg/dL (ref 0–99)
Triglycerides: 134 mg/dL (ref 0–149)
VLDL Cholesterol Cal: 24 mg/dL (ref 5–40)

## 2024-01-31 LAB — HEMOGLOBIN A1C
Est. average glucose Bld gHb Est-mCnc: 134 mg/dL
Hgb A1c MFr Bld: 6.3 % — ABNORMAL HIGH (ref 4.8–5.6)

## 2024-02-01 NOTE — Progress Notes (Signed)
 Called  to schedule an appointment could not leave message voice mail not set up

## 2024-02-05 NOTE — Progress Notes (Signed)
 Appointment has been made

## 2024-03-04 ENCOUNTER — Ambulatory Visit: Payer: Medicare PPO | Admitting: Urology

## 2024-03-04 ENCOUNTER — Encounter: Payer: Self-pay | Admitting: Urology

## 2024-03-04 VITALS — BP 163/80 | HR 81 | Ht 62.0 in | Wt 203.4 lb

## 2024-03-04 DIAGNOSIS — N8111 Cystocele, midline: Secondary | ICD-10-CM | POA: Diagnosis not present

## 2024-03-04 LAB — URINALYSIS, COMPLETE
Bilirubin, UA: NEGATIVE
Glucose, UA: NEGATIVE
Ketones, UA: NEGATIVE
Nitrite, UA: POSITIVE — AB
Protein,UA: NEGATIVE
Specific Gravity, UA: 1.01 (ref 1.005–1.030)
Urobilinogen, Ur: 0.2 mg/dL (ref 0.2–1.0)
pH, UA: 6 (ref 5.0–7.5)

## 2024-03-04 LAB — MICROSCOPIC EXAMINATION

## 2024-03-04 NOTE — Progress Notes (Signed)
 03/04/2024 1:15 PM   Stacy Warren 12-26-1951 [id number]  Referring provider: Jackolyn Confer, MD [address] [place],  [place] [address]  Chief Complaint  Patient presents with   Establish Care    HPI: I was consulted to assist with prolapse.  She feels vaginal bulging.  It appears she was last seen in [facility] in August 2024 and has failed to pessary fittings.  No splinting maneuvers.  Generally does not reduce it.  Bowel function normal.  She is continent  She voids every 2-3 hours and gets up once at night.  He has a good flow  No hysterectomy  No history of kidney stones bladder surgery or bladder infections.  No neurologic issues   PMH: Past Medical History:  Diagnosis Date   Aromatase inhibitor use 06/11/2020   Arthritis    Breast cancer (HCC)    Cancer (HCC)    Motion sickness    back  seat - cars   Osteopenia 11/28/2018   Pulmonary embolism (HCC) 04/09/2018   Completed course of blood thinner in 2007; after femur fracture and surgery    Surgical History: Past Surgical History:  Procedure Laterality Date   BIOPSY  01/23/2024   Procedure: BIOPSY;  Surgeon: Toney Reil, MD;  Location: [facility];  Service: Endoscopy;;   BREAST BIOPSY Right 12/13/2018   stereo bx of mass, coil clip INVASIVE MAMMARY CARCINOMA   BREAST LUMPECTOMY Right 12/31/2018   [facility]   BREAST LUMPECTOMY WITH NEEDLE LOCALIZATION AND AXILLARY SENTINEL LYMPH NODE BX Right 12/31/2018   Procedure: BREAST LUMPECTOMY WITH SENTINEL LYMPH NODE BX, WIRE LOCALIZATION;  Surgeon: Ancil Linsey, MD;  Location: [facility] ORS;  Service: General;  Laterality: Right;   bunionectomy Left    CATARACT EXTRACTION W/PHACO Left 10/12/2022   Procedure: CATARACT EXTRACTION PHACO AND INTRAOCULAR LENS PLACEMENT (IOC) LEFT 11.13 01:13.9;  Surgeon: Lockie Mola, MD;  Location: [facility];  Service: Ophthalmology;  Laterality: Left;   CATARACT EXTRACTION W/PHACO Right 10/26/2022   Procedure: CATARACT  EXTRACTION PHACO AND INTRAOCULAR LENS PLACEMENT (IOC) RIGHT 12.71 01:14.8;  Surgeon: Lockie Mola, MD;  Location: [facility];  Service: Ophthalmology;  Laterality: Right;   CERVICAL CONE BIOPSY  1984   COLONOSCOPY WITH PROPOFOL N/A 01/23/2024   Procedure: COLONOSCOPY WITH PROPOFOL;  Surgeon: Toney Reil, MD;  Location: [facility];  Service: Endoscopy;  Laterality: N/A;  COLON SCOPE REMOVED @ 0836 AND CHANGED OVER TO A PEDS SCOPE IN @ 0838 CECUM REACHED @ 0846   DILATION AND CURETTAGE OF UTERUS  06/07/2018   HIP PINNING  2007   HYSTEROSCOPY WITH D & C N/A 06/07/2018   Procedure: DILATATION AND CURETTAGE /HYSTEROSCOPY/MYOSURE POLYPECTOMY;  Surgeon: Vena Austria, MD;  Location: [facility] ORS;  Service: Gynecology;  Laterality: N/A;   LEG SURGERY  2007   plate   TONSILLECTOMY     VAGINAL DELIVERY     x2    Home Medications:  Allergies as of 03/04/2024   No Known Allergies      Medication List        Accurate as of March 04, 2024  1:15 PM. If you have any questions, ask your nurse or doctor.          STOP taking these medications    acetaminophen 650 MG CR tablet Commonly known as: TYLENOL Stopped by: Lorin Picket A Miamor Ayler       TAKE these medications    anastrozole 1 MG tablet Commonly known as: ARIMIDEX  Take 1 mg by mouth daily.   Biotin 04540 MCG Tabs Take 1 tablet by mouth daily.   CALCIUM 1200+D3 PO Take 1 tablet by mouth daily.   CENTRUM SILVER PO Take 1 tablet by mouth daily.   CO Q10 PO Take 1 tablet by mouth daily.   cyanocobalamin 1000 MCG tablet Commonly known as: VITAMIN B12 Take 1,000 mcg by mouth daily.   ibuprofen 400 MG tablet Commonly known as: ADVIL Take 1 tablet (400 mg total) by mouth every 6 (six) hours as needed.   Intrarosa 6.5 MG Inst Generic drug: Prasterone PLACE 6.5 MG VAGINALLY AT BEDTIME. What changed: additional instructions   TURMERIC CURCUMIN PO Take 500 mg by mouth at bedtime.   VISINE  OP Place 2 drops into both eyes daily as needed (for dry eyes).   Vitamin D3 25 MCG (1000 UT) Caps Take 1,000 Units by mouth daily.        Allergies: No Known Allergies  Family History: Family History  Problem Relation Age of Onset   Alzheimer's disease Mother        deceased 32   Lymphoma Father 50       deceased 11   Heart disease Father    Skin cancer Brother        currently 70   Thyroid disease Son    Hypertension Son    Breast cancer Maternal Aunt 35       deceased 30s   Breast cancer Maternal Aunt 35       deceased 30s   Breast cancer Paternal Aunt        dx 81s; deceased 18s   Colon cancer Neg Hx     Social History:  reports that she has never smoked. She has never used smokeless tobacco. She reports that she does not drink alcohol and does not use drugs.  ROS:                                        Physical Exam: BP (!) 163/80   Pulse 81   Ht 5\' 2"  (1.575 m)   Wt 92.3 kg   BMI 37.20 kg/m   Constitutional:  Alert and oriented, No acute distress. HEENT: [place] AT, moist mucus membranes.  Trachea midline, no masses. Cardiovascular: No clubbing, cyanosis, or edema. Respiratory: Normal respiratory effort, no increased work of breathing. GI: Abdomen is soft, nontender, nondistended, no abdominal masses GU: Patient was a bit nervous during examination and could not cough hard.  Vaginal access was mildly limited but reasonable.  She appeared to have a grade 2 cystocele with moderate defect.  She could not bear down well enough for her cervix to descend.  She had little to no rectocele and no stress incontinence Skin: No rashes, bruises or suspicious lesions. Lymph: No cervical or inguinal adenopathy. Neurologic: Grossly intact, no focal deficits, moving all 4 extremities. Psychiatric: Normal mood and affect.  Laboratory Data: Lab Results  Component Value Date   WBC 6.4 12/21/2018   HGB 13.5 12/21/2018   HCT 41.6 12/21/2018   MCV 86.0  12/21/2018   PLT 249 12/21/2018    Lab Results  Component Value Date   CREATININE 0.77 11/21/2023    No results found for: "PSA"  No results found for: "TESTOSTERONE"  Lab Results  Component Value Date   HGBA1C 6.3 (H) 01/30/2024    Urinalysis  Component Value Date/Time   COLORURINE YELLOW 09/24/2018 1158   APPEARANCEUR CLOUDY (A) 09/24/2018 1158   LABSPEC 1.012 09/24/2018 1158   PHURINE 8.0 09/24/2018 1158   GLUCOSEU NEGATIVE 09/24/2018 1158   HGBUR NEGATIVE 09/24/2018 1158   BILIRUBINUR negative 06/18/2019 0943   KETONESUR NEGATIVE 09/24/2018 1158   PROTEINUR Negative 06/18/2019 0943   PROTEINUR NEGATIVE 09/24/2018 1158   UROBILINOGEN negative (A) 06/18/2019 0943   NITRITE negative 06/18/2019 0943   LEUKOCYTESUR Moderate (2+) (A) 06/18/2019 0943    Pertinent Imaging: Urine reviewed and sent for culture; chart reviewed  Assessment & Plan: Based upon today's examination I was a little bit surprised that she could not be fitted with a pessary.  If she had surgery the role of urodynamics discussed.  She likely could be managed with transvaginal surgery versus a robotic approach.  I also could send her to my nurse to see if she can be fitted with a pessary as well.  Picture was drawn, I am suspect that her prolapse is worse when she is standing and by the end of the day  Even with a history of breast cancer she was using estrogen cream at [facility] with her pessary.  She chose watchful waiting.  She will call my clinic and I will have her see my nurse for a pessary fitting in the future recognizing we may not be able to accomplish her treatment goal based on previous attempts.  She does not wish to have surgery.  Natural history discussed and see as needed   1. Cystocele, midline (Primary)  - Urinalysis, Complete   No follow-ups on file.  Martina Sinner, MD  [facility] [address] [place], [place] [address] [phone]

## 2024-03-06 ENCOUNTER — Telehealth: Payer: Self-pay

## 2024-03-06 NOTE — Telephone Encounter (Signed)
 Copied from CRM [id number]. Topic: General - Other >> Mar 05, 2024  3:25 PM Dondra Prader E wrote: Reason for CRM: Pt called requesting a call back from the clinic.   The patient says she got the results from the lab test with urology.   Best contact: [phone]

## 2024-03-06 NOTE — Telephone Encounter (Signed)
 Called and LVM asking for patient to please return my call.   OK for E2C2 to speak to patient and find out if she is needing something or has questions for [place].

## 2024-03-07 LAB — CULTURE, URINE COMPREHENSIVE

## 2024-03-08 ENCOUNTER — Other Ambulatory Visit: Payer: Self-pay

## 2024-03-08 MED ORDER — NITROFURANTOIN MACROCRYSTAL 100 MG PO CAPS: 100.0000 mg | ORAL_CAPSULE | Freq: Two times a day (BID) | ORAL | 0 refills | Status: AC

## 2024-03-18 ENCOUNTER — Telehealth: Payer: Self-pay | Admitting: Pediatrics

## 2024-03-18 NOTE — Telephone Encounter (Signed)
 Copied from CRM [id number]. Topic: Referral - Request for Referral >> Mar 12, 2024  3:11 PM Elle L wrote: Did the patient discuss referral with their provider in the last year? Yes  Appointment offered? Yes  Type of order/referral and detailed reason for visit: The patient went to her Neurologist appointment that she was referred to and states that the Neurologist found a bladder infection. He prescribed her antibiotics but she would like to see an OB/GYN.   Preference of office, provider, location: Julieanne Manson, DO [facility] [address] [place], [place] [address]  If referral order, have you been seen by this specialty before? Yes  Can we respond through MyChart? Yes

## 2024-03-18 NOTE — Telephone Encounter (Signed)
 Routing to provider to advise.

## 2024-03-19 ENCOUNTER — Other Ambulatory Visit: Payer: Self-pay | Admitting: Pediatrics

## 2024-03-19 DIAGNOSIS — N393 Stress incontinence (female) (male): Secondary | ICD-10-CM

## 2024-03-19 DIAGNOSIS — N8111 Cystocele, midline: Secondary | ICD-10-CM

## 2024-03-19 NOTE — Progress Notes (Signed)
 Requesting urogyn referral per patient request.  Jackolyn Confer, MD

## 2024-03-19 NOTE — Telephone Encounter (Signed)
 Called and LVM notifying patient that referral has been placed for her.

## 2024-05-23 ENCOUNTER — Ambulatory Visit: Payer: Self-pay | Admitting: Family Medicine

## 2024-06-11 ENCOUNTER — Ambulatory Visit: Payer: Medicare PPO | Admitting: Emergency Medicine

## 2024-06-11 VITALS — Ht 62.0 in | Wt 203.0 lb

## 2024-06-11 DIAGNOSIS — Z0000 Encounter for general adult medical examination without abnormal findings: Secondary | ICD-10-CM

## 2024-06-11 NOTE — Progress Notes (Signed)
 Subjective:   Stacy Warren is a 73 y.o. who presents for a Medicare Wellness preventive visit.  As a reminder, Annual Wellness Visits don't include a physical exam, and some assessments may be limited, especially if this visit is performed virtually. We may recommend an in-person follow-up visit with your provider if needed.  Visit Complete: Virtual I connected with  Mylinda Asa on 06/11/24 by a audio enabled telemedicine application and verified that I am speaking with the correct person using two identifiers.  Patient Location: Home  Provider Location: Home Office  I discussed the limitations of evaluation and management by telemedicine. The patient expressed understanding and agreed to proceed.  Vital Signs: Because this visit was a virtual/telehealth visit, some criteria may be missing or patient reported. Any vitals not documented were not able to be obtained and vitals that have been documented are patient reported.  VideoDeclined- This patient declined Librarian, academic. Therefore the visit was completed with audio only.  Persons Participating in Visit: Patient.  AWV Questionnaire: No: Patient Medicare AWV questionnaire was not completed prior to this visit.  Cardiac Risk Factors include: advanced age (>20men, >17 women);obesity (BMI >30kg/m2)     Objective:    Today's Vitals   06/11/24 1437  Weight: 203 lb (92.1 kg)  Height: 5' 2 (1.575 m)   Body mass index is 37.13 kg/m.     06/11/2024    2:53 PM 01/23/2024    7:25 AM 10/26/2022    8:37 AM 10/12/2022   11:29 AM 01/23/2019   10:08 AM 01/07/2019   10:25 AM 12/27/2018    3:00 PM  Advanced Directives  Does Patient Have a Medical Advance Directive? Yes Yes Yes Yes Yes  Yes  Yes   Type of Estate agent of [place];Living will Healthcare Power of [place];Living will  Healthcare Power of [place];Living will Healthcare Power of [place];Living will Healthcare  Power of [place];Living will Healthcare Power of [place];Living will  Does patient want to make changes to medical advance directive? No - Patient declined No - Patient declined  No - Patient declined   No - Patient declined   Copy of Healthcare Power of Attorney in Chart? No - copy requested No - copy requested  Yes - validated most recent copy scanned in chart (See row information) No - copy requested  No - copy requested  No - copy requested      Data saved with a previous flowsheet row definition    Current Medications (verified) Outpatient Encounter Medications as of 06/11/2024  Medication Sig   acetaminophen  (TYLENOL ) 650 MG CR tablet Take 1,300 mg by mouth every 8 (eight) hours as needed for pain.   Biotin [address] MCG TABS Take 1 tablet by mouth daily.   Calcium-Magnesium-Vitamin D (CALCIUM 1200+D3 PO) Take 1 tablet by mouth daily.   Cholecalciferol (VITAMIN D3) 1000 units CAPS Take 1,000 Units by mouth daily.   Coenzyme Q10 (CO Q10 PO) Take 1 tablet by mouth daily.   ibuprofen  (ADVIL ,MOTRIN ) 400 MG tablet Take 1 tablet (400 mg total) by mouth every 6 (six) hours as needed.   INTRAROSA  6.5 MG INST PLACE 6.5 MG VAGINALLY AT BEDTIME. (Patient taking differently: Place 6.5 mg vaginally at bedtime. Twice weekly)   Multiple Vitamins-Minerals (CENTRUM SILVER PO) Take 1 tablet by mouth daily.   Polyethyl Glycol-Propyl Glycol (SYSTANE OP) Apply 1 drop to eye daily.   TURMERIC CURCUMIN PO Take 500 mg by mouth at bedtime.  vitamin B-12 (CYANOCOBALAMIN) 1000 MCG tablet Take 1,000 mcg by mouth daily.   anastrozole (ARIMIDEX) 1 MG tablet Take 1 mg by mouth daily. (Patient not taking: Reported on 06/11/2024)   Tetrahydrozoline HCl (VISINE OP) Place 2 drops into both eyes daily as needed (for dry eyes). (Patient not taking: Reported on 06/11/2024)   No facility-administered encounter medications on file as of 06/11/2024.    Allergies (verified) Patient has no known allergies.   History: Past  Medical History:  Diagnosis Date   Aromatase inhibitor use 06/11/2020   Arthritis    Breast cancer (HCC)    Cancer (HCC)    Motion sickness    back  seat - cars   Osteopenia 11/28/2018   Pulmonary embolism (HCC) 04/09/2018   Completed course of blood thinner in 2007; after femur fracture and surgery   Past Surgical History:  Procedure Laterality Date   BIOPSY  01/23/2024   Procedure: BIOPSY;  Surgeon: Selena Daily, MD;  Location: [facility];  Service: Endoscopy;;   BREAST BIOPSY Right 12/13/2018   stereo bx of mass, coil clip INVASIVE MAMMARY CARCINOMA   BREAST LUMPECTOMY Right 12/31/2018   [facility]   BREAST LUMPECTOMY WITH NEEDLE LOCALIZATION AND AXILLARY SENTINEL LYMPH NODE BX Right 12/31/2018   Procedure: BREAST LUMPECTOMY WITH SENTINEL LYMPH NODE BX, WIRE LOCALIZATION;  Surgeon: Franki Isles, MD;  Location: [facility] ORS;  Service: General;  Laterality: Right;   bunionectomy Left    CATARACT EXTRACTION W/PHACO Left 10/12/2022   Procedure: CATARACT EXTRACTION PHACO AND INTRAOCULAR LENS PLACEMENT (IOC) LEFT 11.13 01:13.9;  Surgeon: Annell Kidney, MD;  Location: [facility];  Service: Ophthalmology;  Laterality: Left;   CATARACT EXTRACTION W/PHACO Right 10/26/2022   Procedure: CATARACT EXTRACTION PHACO AND INTRAOCULAR LENS PLACEMENT (IOC) RIGHT 12.71 01:14.8;  Surgeon: Annell Kidney, MD;  Location: [facility];  Service: Ophthalmology;  Laterality: Right;   CERVICAL CONE BIOPSY  1984   COLONOSCOPY WITH PROPOFOL  N/A 01/23/2024   Procedure: COLONOSCOPY WITH PROPOFOL ;  Surgeon: Selena Daily, MD;  Location: [facility];  Service: Endoscopy;  Laterality: N/A;  COLON SCOPE REMOVED @ 0836 AND CHANGED OVER TO A PEDS SCOPE IN @ 0838 CECUM REACHED @ 0846   DILATION AND CURETTAGE OF UTERUS  06/07/2018   HIP PINNING  2007   HYSTEROSCOPY WITH D & C N/A 06/07/2018   Procedure: DILATATION AND CURETTAGE /HYSTEROSCOPY/MYOSURE POLYPECTOMY;  Surgeon:  Darl Edu, MD;  Location: [facility] ORS;  Service: Gynecology;  Laterality: N/A;   LEG SURGERY  2007   plate   TONSILLECTOMY     VAGINAL DELIVERY     x2   Family History  Problem Relation Age of Onset   Alzheimer's disease Mother        deceased 31   Lymphoma Father 10       deceased 13   Heart disease Father    Skin cancer Brother        currently 33   Thyroid disease Son    Hypertension Son    Breast cancer Maternal Aunt 35       deceased 30s   Breast cancer Maternal Aunt 35       deceased 30s   Breast cancer Paternal Aunt        dx 85s; deceased 73s   Colon cancer Neg Hx    Social History   Socioeconomic History   Marital status: Married    Spouse name: Brenita Callow   Number of children: 2  Years of education: 25   Highest education level: Some college, no degree  Occupational History   Occupation: retired   Occupation: retired  Tobacco Use   Smoking status: Never   Smokeless tobacco: Never  Vaping Use   Vaping status: Never Used  Substance and Sexual Activity   Alcohol use: No   Drug use: No   Sexual activity: Not Currently  Other Topics Concern   Not on file  Social History Narrative   Not on file   Social Drivers of Health   Financial Resource Strain: Low Risk  (06/11/2024)   Overall Financial Resource Strain (CARDIA)    Difficulty of Paying Living Expenses: Not hard at all  Food Insecurity: No Food Insecurity (06/11/2024)   Hunger Vital Sign    Worried About Running Out of Food in the Last Year: Never true    Ran Out of Food in the Last Year: Never true  Transportation Needs: No Transportation Needs (06/11/2024)   PRAPARE - Administrator, Civil Service (Medical): No    Lack of Transportation (Non-Medical): No  Physical Activity: Inactive (06/11/2024)   Exercise Vital Sign    Days of Exercise per Week: 0 days    Minutes of Exercise per Session: 0 min  Stress: Stress Concern Present (06/11/2024)   [employer] of Occupational  Health - Occupational Stress Questionnaire    Feeling of Stress: To some extent  Social Connections: Moderately Integrated (06/11/2024)   Social Connection and Isolation Panel    Frequency of Communication with Friends and Family: More than three times a week    Frequency of Social Gatherings with Friends and Family: More than three times a week    Attends Religious Services: More than 4 times per year    Active Member of [employer] or Organizations: No    Attends Engineer, structural: Never    Marital Status: Married    Tobacco Counseling Counseling given: No    Clinical Intake:  Pre-visit preparation completed: Yes  Pain : No/denies pain     BMI - recorded: 37.13 Nutritional Status: BMI > 30  Obese Nutritional Risks: None Diabetes: No  Lab Results  Component Value Date   HGBA1C 6.3 (H) 01/30/2024     How often do you need to have someone help you when you read instructions, pamphlets, or other written materials from your doctor or pharmacy?: 1 - Never  Interpreter Needed?: No  Information entered by :: Jaunita Messier, CMA   Activities of Daily Living     06/11/2024    2:40 PM 01/23/2024    7:27 AM  In your present state of health, do you have any difficulty performing the following activities:  Hearing? 0 0  Vision? 0 0  Difficulty concentrating or making decisions? 0 0  Walking or climbing stairs? 1   Comment bad knees   Dressing or bathing? 0   Doing errands, shopping? 0   Preparing Food and eating ? N   Using the Toilet? N   In the past six months, have you accidently leaked urine? N   Do you have problems with loss of bowel control? N   Managing your Medications? N   Managing your Finances? N   Housekeeping or managing your Housekeeping? N     Patient Care Team: Hadassah Letters, MD as PCP - General (Family Medicine) Bary Boss., MD (Ophthalmology) Bert Britain, PA-C (Orthopedic Surgery) Aubry Blase, Robbie Chiles, MD as Consulting Physician  (Orthopedic Surgery) MacDiarmid,  Geralyn Knee, MD as Consulting Physician (Urology) Silverio Drought, MD as Referring Physician (Oncology)  I have updated your Care Teams any recent Medical Services you may have received from other providers in the past year.     Assessment:   This is a routine wellness examination for Sharne.  Hearing/Vision screen Hearing Screening - Comments:: Denies hearing loss Vision Screening - Comments:: Gets routine eye exams, Dr. Shann Darnel, [place]   Goals Addressed               This Visit's Progress     Weight (lb) < 175 lb (79.4 kg) (pt-stated)   203 lb (92.1 kg)      Depression Screen     06/11/2024    2:50 PM 01/30/2024    1:13 PM 11/21/2023    2:05 PM 06/18/2019    9:35 AM 01/23/2019    8:14 AM 11/16/2018   10:43 AM 09/24/2018   11:03 AM  PHQ 2/9 Scores  PHQ - 2 Score 2 6 2  0 0 0 0  PHQ- 9 Score 3 7 4  0 0 0 0    Fall Risk     06/11/2024    2:54 PM 01/30/2024    1:13 PM 11/21/2023    2:05 PM 06/18/2019    9:35 AM 01/23/2019    8:14 AM  Fall Risk   Falls in the past year? 0 0 0 0  0   Number falls in past yr: 0 0 0 0  0   Injury with Fall? 0 0 0 0 0  Risk for fall due to : No Fall Risks No Fall Risks No Fall Risks    Follow up Falls evaluation completed Falls evaluation completed Falls evaluation completed       Data saved with a previous flowsheet row definition    MEDICARE RISK AT HOME:  Medicare Risk at Home Any stairs in or around the home?: Yes If so, are there any without handrails?: No Home free of loose throw rugs in walkways, pet beds, electrical cords, etc?: Yes Adequate lighting in your home to reduce risk of falls?: Yes Life alert?: No Use of a cane, walker or w/c?: No Grab bars in the bathroom?: Yes Shower chair or bench in shower?: Yes Elevated toilet seat or a handicapped toilet?: Yes  TIMED UP AND GO:  Was the test performed?  No  Cognitive Function: 6CIT completed        06/11/2024    2:55 PM   6CIT Screen  What Year? 0 points  What month? 0 points  What time? 0 points  Count back from 20 0 points  Months in reverse 0 points  Repeat phrase 0 points  Total Score 0 points    Immunizations Immunization History  Administered Date(s) Administered   Influenza, High Dose Seasonal PF 09/24/2018, 09/23/2019, 10/06/2023   Influenza-Unspecified 09/23/2019, 12/14/2020, 10/06/2021, 09/24/2022, 10/06/2023   PFIZER(Purple Top)SARS-COV-2 Vaccination 04/16/2020, 05/12/2020, 06/23/2021, 01/21/2022   Pfizer Covid-19 Vaccine Bivalent Booster 89yrs & up 01/21/2022   Pneumococcal Conjugate-13 12/05/2016   Pneumococcal Polysaccharide-23 12/02/2015   Tdap 01/23/2019, 01/23/2019    Screening Tests Health Maintenance  Topic Date Due   Zoster Vaccines- Shingrix (1 of 2) Never done   COVID-19 Vaccine (5 - 2024-25 season) 08/27/2023   MAMMOGRAM  06/12/2024   INFLUENZA VACCINE  07/26/2024   Medicare Annual Wellness (AWV)  06/11/2025   DEXA SCAN  01/15/2026   DTaP/Tdap/Td (3 - Td or Tdap) 01/23/2029   Colonoscopy  01/22/2034   Pneumococcal Vaccine: 50+ Years  Completed   Hepatitis C Screening  Completed   HPV VACCINES  Aged Out   Meningococcal B Vaccine  Aged Out    Health Maintenance  Health Maintenance Due  Topic Date Due   Zoster Vaccines- Shingrix (1 of 2) Never done   COVID-19 Vaccine (5 - 2024-25 season) 08/27/2023   Health Maintenance Items Addressed: See Nurse Notes at the end of this note  Additional Screening:  Vision Screening: Recommended annual ophthalmology exams for early detection of glaucoma and other disorders of the eye. Would you like a referral to an eye doctor? No    Dental Screening: Recommended annual dental exams for proper oral hygiene  Community Resource Referral / Chronic Care Management: CRR required this visit?  No   CCM required this visit?  No   Plan:    I have personally reviewed and noted the following in the patient's chart:   Medical  and social history Use of alcohol, tobacco or illicit drugs  Current medications and supplements including opioid prescriptions. Patient is not currently taking opioid prescriptions. Functional ability and status Nutritional status Physical activity Advanced directives List of other physicians Hospitalizations, surgeries, and ER visits in previous 12 months Vitals Screenings to include cognitive, depression, and falls Referrals and appointments  In addition, I have reviewed and discussed with patient certain preventive protocols, quality metrics, and best practice recommendations. A written personalized care plan for preventive services as well as general preventive health recommendations were provided to patient.   Jaunita Messier, CMA   06/11/2024   After Visit Summary: (MyChart) Due to this being a telephonic visit, the after visit summary with patients personalized plan was offered to patient via MyChart   Notes:  MMG scheduled for 06/18/24 @ [facility] Screening colonoscopy no longer recommended due to age Declined Covid and Shingles vaccines

## 2024-06-11 NOTE — Patient Instructions (Signed)
 Stacy Warren , Thank you for taking time out of your busy schedule to complete your Annual Wellness Visit with me. I enjoyed our conversation and look forward to speaking with you again next year. I, as well as your care team,  appreciate your ongoing commitment to your health goals. Please review the following plan we discussed and let me know if I can assist you in the future. Your Game plan/ To Do List    Referrals: None   Follow up Visits: Next Medicare AWV with our clinical staff: 06/17/25 @ 2:30pm (PHONE VISIT)   Have you seen your provider in the last 6 months (3 months if uncontrolled diabetes)? Yes Next Office Visit with your provider: 07/30/24 @ 8:00am with Dr. Juliette Oh  Clinician Recommendations:  Aim for 30 minutes of exercise or brisk walking, 6-8 glasses of water , and 5 servings of fruits and vegetables each day.       This is a list of the screening recommended for you and due dates:  Health Maintenance  Topic Date Due   Zoster (Shingles) Vaccine (1 of 2) Never done   COVID-19 Vaccine (5 - 2024-25 season) 08/27/2023   Mammogram  06/12/2024   Flu Shot  07/26/2024   Medicare Annual Wellness Visit  06/11/2025   DEXA scan (bone density measurement)  01/15/2026   DTaP/Tdap/Td vaccine (3 - Td or Tdap) 01/23/2029   Colon Cancer Screening  01/22/2034   Pneumococcal Vaccine for age over 15  Completed   Hepatitis C Screening  Completed   HPV Vaccine  Aged Out   Meningitis B Vaccine  Aged Out    Advanced directives: (Copy Requested) Please bring a copy of your health care power of attorney and living will to the office to be added to your chart at your convenience. You can mail to [facility] [address] [place], [place] [address] or email to [email or web address] Advance Care Planning is important because it:  [x]  Makes sure you receive the medical care that is consistent with your values, goals, and preferences  [x]  It provides guidance to your family  and loved ones and reduces their decisional burden about whether or not they are making the right decisions based on your wishes.  Follow the link provided in your after visit summary or read over the paperwork we have mailed to you to help you started getting your Advance Directives in place. If you need assistance in completing these, please reach out to us  so that we can help you!  See attachments for Preventive Care and Fall Prevention Tips.   Fall Prevention in the Home, Adult Falls can cause injuries and affect people of all ages. There are many simple things that you can do to make your home safe and to help prevent falls. If you need it, ask for help making these changes. What actions can I take to prevent falls? General information Use good lighting in all rooms. Make sure to: Replace any light bulbs that burn out. Turn on lights if it is dark and use night-lights. Keep items that you use often in easy-to-reach places. Lower the shelves around your home if needed. Move furniture so that there are clear paths around it. Do not keep throw rugs or other things on the floor that can make you trip. If any of your floors are uneven, fix them. Add color or contrast paint or tape to clearly mark and help you see: Grab bars or handrails. First and last steps  of staircases. Where the edge of each step is. If you use a ladder or stepladder: Make sure that it is fully opened. Do not climb a closed ladder. Make sure the sides of the ladder are locked in place. Have someone hold the ladder while you use it. Know where your pets are as you move through your home. What can I do in the bathroom?     Keep the floor dry. Clean up any water  that is on the floor right away. Remove soap buildup in the bathtub or shower. Buildup makes bathtubs and showers slippery. Use non-skid mats or decals on the floor of the bathtub or shower. Attach bath mats securely with double-sided, non-slip rug  tape. If you need to sit down while you are in the shower, use a non-slip stool. Install grab bars by the toilet and in the bathtub and shower. Do not use towel bars as grab bars. What can I do in the bedroom? Make sure that you have a light by your bed that is easy to reach. Do not use any sheets or blankets on your bed that hang to the floor. Have a firm bench or chair with side arms that you can use for support when you get dressed. What can I do in the kitchen? Clean up any spills right away. If you need to reach something above you, use a sturdy step stool that has a grab bar. Keep electrical cables out of the way. Do not use floor polish or wax that makes floors slippery. What can I do with my stairs? Do not leave anything on the stairs. Make sure that you have a light switch at the top and the bottom of the stairs. Have them installed if you do not have them. Make sure that there are handrails on both sides of the stairs. Fix handrails that are broken or loose. Make sure that handrails are as long as the staircases. Install non-slip stair treads on all stairs in your home if they do not have carpet. Avoid having throw rugs at the top or bottom of stairs, or secure the rugs with carpet tape to prevent them from moving. Choose a carpet design that does not hide the edge of steps on the stairs. Make sure that carpet is firmly attached to the stairs. Fix any carpet that is loose or worn. What can I do on the outside of my home? Use bright outdoor lighting. Repair the edges of walkways and driveways and fix any cracks. Clear paths of anything that can make you trip, such as tools or rocks. Add color or contrast paint or tape to clearly mark and help you see high doorway thresholds. Trim any bushes or trees on the main path into your home. Check that handrails are securely fastened and in good repair. Both sides of all steps should have handrails. Install guardrails along the edges of any  raised decks or porches. Have leaves, snow, and ice cleared regularly. Use sand, salt, or ice melt on walkways during winter months if you live where there is ice and snow. In the garage, clean up any spills right away, including grease or oil spills. What other actions can I take? Review your medicines with your health care provider. Some medicines can make you confused or feel dizzy. This can increase your chance of falling. Wear closed-toe shoes that fit well and support your feet. Wear shoes that have rubber soles and low heels. Use a cane, walker, scooter, or crutches  that help you move around if needed. Talk with your provider about other ways that you can decrease your risk of falls. This may include seeing a physical therapist to learn to do exercises to improve movement and strength. Where to find more information Centers for Disease Control and Prevention, STEAD[employer]: TonerPromos.no [employer] on Aging: BaseRingTones.pl National Institute on Aging: BaseRingTones.pl Contact a health care provider if: You are afraid of falling at home. You feel weak, drowsy, or dizzy at home. You fall at home. Get help right away if you: Lose consciousness or have trouble moving after a fall. Have a fall that causes a head injury. These symptoms may be an emergency. Get help right away. Call 911. Do not wait to see if the symptoms will go away. Do not drive yourself to the hospital. This information is not intended to replace advice given to you by your health care provider. Make sure you discuss any questions you have with your health care provider. Document Revised: 08/15/2022 Document Reviewed: 08/15/2022 Elsevier Patient Education  2024 [employer].

## 2024-06-20 ENCOUNTER — Ambulatory Visit: Payer: Self-pay

## 2024-06-20 NOTE — Telephone Encounter (Signed)
  FYI Only or Action Required?: Action required by provider: clinical question for provider and update on patient condition.  Patient was last seen in primary care on 01/30/2024 by Herold Hadassah SQUIBB, MD. Called Nurse Triage reporting No chief complaint on file.. Symptoms began a few days ago. Interventions attempted: Rest, hydration, or home remedies. Symptoms are: gradually worsening.  Triage Disposition: See Physician Within 24 Hours  Patient/caregiver understands and will follow disposition?: Unsure--Patient advised Urgent Care but unsure if patient will go at this time                              Copied from CRM #[id number]. Topic: Clinical - Red Word Triage >> Jun 20, 2024 10:44 AM Berwyn MATSU wrote: Red Word that prompted transfer to Nurse Triage: uti symptoms painful sting when she inserted gel. Reason for Disposition  Urinating more frequently than usual (i.e., frequency)  Answer Assessment - Initial Assessment Questions 1. SYMPTOM: What's the main symptom you're concerned about? (e.g., frequency, incontinence)     Slower urine stream during the day but not at night 2. ONSET: When did the  slower urinary stream  start?     X a few days 3. PAIN: Is there any pain? If Yes, ask: How bad is it? (Scale: 1-10; mild, moderate, severe)     Patient denies 4. CAUSE: What do you think is causing the symptoms?     Unsure if an infection may be starting 5. OTHER SYMPTOMS: Do you have any other symptoms? (e.g., blood in urine, fever, flank pain, pain with urination)     Some slight burn when inserting gel   Initially, patient just wanted her PCP's advice of possible over the counter solutions to her symptoms. Patient states she is staying hydrated and is not having a fever that she is aware of. Patient states next month she goes to get a pessary Patient is advised the the recommendation at this time is that she be seen in the next 24 hours for evaluation of her  symptoms. There are no available appointments in patient's PCP office for the next couple of days so Urgent Care is recommended. Patient is advised that she is going to drink cranberry juice and she may check out going to a nearby Urgent Care for further evaluation. Patient is also advised that if anything worsens to go to the Emergency Room.  Protocols used: Urinary Symptoms-A-AH

## 2024-06-21 ENCOUNTER — Ambulatory Visit: Admitting: Pediatrics

## 2024-06-21 ENCOUNTER — Encounter: Payer: Self-pay | Admitting: Pediatrics

## 2024-06-21 VITALS — BP 131/80 | HR 63 | Temp 97.9°F | Wt 205.8 lb

## 2024-06-21 DIAGNOSIS — R300 Dysuria: Secondary | ICD-10-CM

## 2024-06-21 DIAGNOSIS — N949 Unspecified condition associated with female genital organs and menstrual cycle: Secondary | ICD-10-CM | POA: Diagnosis not present

## 2024-06-21 DIAGNOSIS — Z636 Dependent relative needing care at home: Secondary | ICD-10-CM

## 2024-06-21 LAB — URINALYSIS, ROUTINE W REFLEX MICROSCOPIC
Bilirubin, UA: NEGATIVE
Glucose, UA: NEGATIVE
Ketones, UA: NEGATIVE
Nitrite, UA: NEGATIVE
Protein,UA: NEGATIVE
Specific Gravity, UA: <1.005 — ABNORMAL LOW (ref 1.005–1.030)
Urobilinogen, Ur: 0.2 mg/dL (ref 0.2–1.0)
pH, UA: 5.5 (ref 5.0–7.5)

## 2024-06-21 LAB — WET PREP FOR TRICH, YEAST, CLUE
Clue Cell Exam: NEGATIVE
Trichomonas Exam: NEGATIVE
Yeast Exam: NEGATIVE

## 2024-06-21 LAB — MICROSCOPIC EXAMINATION: Bacteria, UA: NONE SEEN

## 2024-06-21 MED ORDER — NITROFURANTOIN MONOHYD MACRO 100 MG PO CAPS: 100.0000 mg | ORAL_CAPSULE | Freq: Two times a day (BID) | ORAL | 0 refills | Status: AC

## 2024-06-21 NOTE — Patient Instructions (Signed)
Start macrobid twice daily for 5 days

## 2024-06-21 NOTE — Telephone Encounter (Signed)
 Attempted to reach patient no answer and full vm

## 2024-06-22 ENCOUNTER — Encounter: Payer: Self-pay | Admitting: Pediatrics

## 2024-06-22 NOTE — Progress Notes (Signed)
 Office Visit  BP 131/80   Pulse 63   Temp 97.9 F (36.6 C) (Oral)   Wt 205 lb 12.8 oz (93.4 kg)   SpO2 93%   BMI 37.64 kg/m    Subjective:    Patient ID: Stacy Warren, female    DOB: [birth year], 73 y.o.   MRN: [id number]  HPI: Stacy Warren is a 73 y.o. female  Chief Complaint  Patient presents with   Dysuria    Discussed the use of AI scribe software for clinical note transcription with the patient, who gave verbal consent to proceed.  History of Present Illness   Stacy Warren is a 73 year old female who presents with symptoms of a urinary tract infection.  She experiences increased urinary frequency, urgency, and dysuria. She has a history of urinary tract infections, with a recent positive test. Previous cultures have shown that all antibiotics are effective for her, and she has been treated with Macrobid  (nitrofurantoin ) in the past, which was effective. She has not used any over-the-counter UTI treatments recently.  She describes a stinging sensation upon applying a vaginal gel, which she finds unusual. She also feels bloated and has been hydrating due to the heat. She consumes cranberry juice, although it was not specifically recommended to her.  She is scheduled to receive a pessary on July 18th. Her husband has dementia and diabetes, and she is his primary caregiver, managing his care without external help. She notes the challenges of caregiving, describing it as the hardest thing she has ever done. Her mother also had Alzheimer's, but she did not live with her.        Relevant past medical, surgical, family and social history reviewed and updated as indicated. Interim medical history since our last visit reviewed. Allergies and medications reviewed and updated.  ROS per HPI unless specifically indicated above     Objective:    BP 131/80   Pulse 63   Temp 97.9 F (36.6 C) (Oral)   Wt 205 lb 12.8 oz (93.4 kg)   SpO2 93%   BMI 37.64 kg/m   Wt  Readings from Last 3 Encounters:  06/21/24 205 lb 12.8 oz (93.4 kg)  06/11/24 203 lb (92.1 kg)  03/04/24 203 lb 6.4 oz (92.3 kg)     Physical Exam      06/21/2024   11:10 AM 06/11/2024    2:50 PM 01/30/2024    1:13 PM 11/21/2023    2:05 PM 06/18/2019    9:35 AM  Depression screen PHQ 2/9  Decreased Interest 1 1 3 1  0  Down, Depressed, Hopeless 1 1 3 1  0  PHQ - 2 Score 2 2 6 2  0  Altered sleeping 0 0 0 1 0  Tired, decreased energy 1 1 1 1  0  Change in appetite 0 0 0 0 0  Feeling bad or failure about yourself  0 0 0 0 0  Trouble concentrating 0 0 0 0 0  Moving slowly or fidgety/restless 0 0 0 0 0  Suicidal thoughts 0 0 0 0 0  PHQ-9 Score 3 3 7 4  0  Difficult doing work/chores Not difficult at all Somewhat difficult Not difficult at all Not difficult at all Not difficult at all       06/21/2024   11:10 AM 01/30/2024    1:14 PM 11/21/2023    2:05 PM  GAD 7 : Generalized Anxiety Score  Nervous, Anxious, on Edge 1 1 1   Control/stop  worrying 0 1 1  Worry too much - different things 0 1 1  Trouble relaxing 0 0 0  Restless 0 0 0  Easily annoyed or irritable 0 1 0  Afraid - awful might happen 1 0 0  Total GAD 7 Score 2 4 3   Anxiety Difficulty Not difficult at all Not difficult at all Not difficult at all       Assessment & Plan:  Assessment & Plan   Dysuria Urinalysis confirmed UTI. Last UTI treated with nitrofurantoin . - Prescribe nitrofurantoin  100 mg twice daily for 5 days. - Send urine culture for bacterial identification and antibiotic sensitivity. - Advise to call if symptoms persist or worsen post-antibiotic course. -     Urinalysis, Routine w reflex microscopic -     Urine Culture -     Nitrofurantoin  Monohyd Macro; Take 1 capsule (100 mg total) by mouth 2 (two) times daily for 5 days.  Dispense: 10 capsule; Refill: 0 -     Microscopic Examination  Vaginal discomfort Neg swab. -     WET PREP FOR TRICH, YEAST, CLUE  Caregiver stress Husband's dementia affects  her well-being as primary caregiver. Discussed home health services and caregiver support programs. - Consider referral to social work for caregiver support resources. - Encourage continued discussions about advanced care planning and eldercare options.   Follow up plan: Return if symptoms worsen or fail to improve.  Stacy SHAUNNA Nett, MD

## 2024-06-24 ENCOUNTER — Ambulatory Visit: Payer: Self-pay | Admitting: Pediatrics

## 2024-06-24 LAB — URINE CULTURE

## 2024-06-25 ENCOUNTER — Telehealth: Payer: Self-pay

## 2024-06-25 NOTE — Telephone Encounter (Signed)
 FYI to provider regarding results.

## 2024-06-25 NOTE — Telephone Encounter (Signed)
 Copied from CRM [id number]. Topic: Clinical - Lab/Test Results >> Jun 25, 2024 12:59 PM Stacy Warren wrote: Pt returned office call and have stated her symptoms have seemed to have resolved as she's feeling a lot better. She would like to continue the medication Macrobid  before switching as its only been two days and she feels better.

## 2024-07-10 ENCOUNTER — Ambulatory Visit: Admitting: Obstetrics and Gynecology

## 2024-07-11 NOTE — Progress Notes (Unsigned)
 New Patient Evaluation and Consultation  Referring Provider: Herold Hadassah SQUIBB, MD PCP: Herold Hadassah SQUIBB, MD Date of Service: 07/12/2024  SUBJECTIVE Chief Complaint: No chief complaint on file.  History of Present Illness: Stacy Warren is a 73 y.o. {ED SANE [phone] female seen in consultation at the request of Dr Herold for evaluation of Stage I pelvic organ prolapse with pessary use.    Pessary placed 7/18 by *** size 3 incontinence ring with support pessary placed 08/03/18 and expelled. Size 3 long stem gellhorn pessary placed 08/15/18 and expelled during straining for bowel movement. Replaced with size 6 ring with support pessary, size 2 donut, 3 donut, 3 short stem gellhorn since 12/25/19 with increased SUI Pessary check every 6 months, last changed to size 5 ring with support pessary in 09/19/23 until ***expulsion of pessary Evaluated by Dr. 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 Lake (OBGYN) in 2019 due to postmenopausal bleeding, SUI 3x/week and stage II pelvic organ prolapse TVUS in 2019 with ES 8mm, irregular with cystic spaces vs. Polypoid area. Underwent hysteroscopy D&C 06/07/18 with endometrial polyp  ***Review of records significant for: ***Stage II CKD, anastrozole use for ER + breast invasive ductal carcinoma s/p lumpectomy, history of PE  TVUS 09/19/23 [facility] ULTRASOUND  LMP: remote  INDICATION: PMB  UTERINE MEASUREMENTS:  6.9 x 4.5 x 3.4 cm  ANOMALIES OF THE CERVIX: None seen  ENDOMETRIAL STRIPE: 3.5 mm  UTERINE ABNORMALITIES: bone  LEFT  OVARY:  1.9 x 2.0 x 0.8 cm  RIGHT OVARY: 2.0 x 1.8 x 0.9 cm  OVARIAN VASCULARITY: grossly normal  FREE FLUID:  none  MASSES OR CYSTS IN PELVIS:  none  PROBES USED:   ABDOMINAL/ VAGINAL -both Exam End: 09/19/23 15:42    Urinary Symptoms: Does not leak urine.  Leaks *** time(s) per {days/wks/mos/yrs:310907}.  Pad  use: {NUMBERS 1-10:18281} {pad option:24752} per day.   Patient {ACTION; IS/IS WNU:78978602} bothered by UI symptoms.  Day time voids 8.  Nocturia: *** times per night to void. Voiding dysfunction:  empties bladder well.  Patient does not use a catheter to empty bladder.  When urinating, patient feels {urine symptoms:24756} Drinks: ***oz water  per day  UTIs: {NUMBERS 1-10:18281} UTI's in the last year.   {ACTIONS;DENIES/REPORTS:21021675::Denies} history of blood in urine, kidney or bladder stones, pyelonephritis, bladder cancer, and kidney cancer No results found for the last 90 days.   Pelvic Organ Prolapse Symptoms:                  Patient Denies a feeling of a bulge the vaginal area. It has been present for {NUMBER 1-10:22536} {days/wks/mos/yrs:310907}.  Patient {denies/ admits to:24761} seeing a bulge.  This bulge {ACTION; IS/IS WNU:78978602} bothersome.  Bowel Symptom: Bowel movements: 2 time(s) per day with diverticulosis Stool consistency: soft  Straining: yes.  Splinting: no.  Incomplete evacuation: no.  Patient Denies accidental bowel leakage / fecal incontinence Bowel regimen: stool softener Last colonoscopy: Results diverticulosis, hemorrhoids HM Colonoscopy          Upcoming     Colonoscopy (Every 10 Years) Next due on 01/22/2034    01/23/2024  COLONOSCOPY   Only the first 1 history entries have been loaded, but more history exists.                Sexual Function Sexually active: no.  Sexual orientation: {Sexual [phone]} Pain with sex: {pain with sex:24762}  Pelvic Pain Denies pelvic  pain Location: *** Pain occurs: *** Prior pain treatment: *** Improved by: *** Worsened by: ***   Past Medical History:  Past Medical History:  Diagnosis Date   Aromatase inhibitor use 06/11/2020   Arthritis    Breast cancer (HCC)    Cancer (HCC)    Motion sickness    back  seat - cars   Osteopenia 11/28/2018   Pulmonary embolism (HCC)  04/09/2018   Completed course of blood thinner in 2007; after femur fracture and surgery     Past Surgical History:   Past Surgical History:  Procedure Laterality Date   BIOPSY  01/23/2024   Procedure: BIOPSY;  Surgeon: Unk Corinn Skiff, MD;  Location: [facility];  Service: Endoscopy;;   BREAST BIOPSY Right 12/13/2018   stereo bx of mass, coil clip INVASIVE MAMMARY CARCINOMA   BREAST LUMPECTOMY Right 12/31/2018   [facility]   BREAST LUMPECTOMY WITH NEEDLE LOCALIZATION AND AXILLARY SENTINEL LYMPH NODE BX Right 12/31/2018   Procedure: BREAST LUMPECTOMY WITH SENTINEL LYMPH NODE BX, WIRE LOCALIZATION;  Surgeon: Nicholaus Selinda Birmingham, MD;  Location: [facility];  Service: General;  Laterality: Right;   bunionectomy Left    CATARACT EXTRACTION W/PHACO Left 10/12/2022   Procedure: CATARACT EXTRACTION PHACO AND INTRAOCULAR LENS PLACEMENT (IOC) LEFT 11.13 01:13.9;  Surgeon: Mittie Gaskin, MD;  Location: [facility];  Service: Ophthalmology;  Laterality: Left;   CATARACT EXTRACTION W/PHACO Right 10/26/2022   Procedure: CATARACT EXTRACTION PHACO AND INTRAOCULAR LENS PLACEMENT (IOC) RIGHT 12.71 01:14.8;  Surgeon: Mittie Gaskin, MD;  Location: [facility];  Service: Ophthalmology;  Laterality: Right;   CERVICAL CONE BIOPSY  1984   COLONOSCOPY WITH PROPOFOL  N/A 01/23/2024   Procedure: COLONOSCOPY WITH PROPOFOL ;  Surgeon: Unk Corinn Skiff, MD;  Location: [facility];  Service: Endoscopy;  Laterality: N/A;  COLON SCOPE REMOVED @ 0836 AND CHANGED OVER TO A PEDS SCOPE IN @ 0838 CECUM REACHED @ 0846   DILATION AND CURETTAGE OF UTERUS  06/07/2018   HIP PINNING  2007   HYSTEROSCOPY WITH D & C N/A 06/07/2018   Procedure: DILATATION AND CURETTAGE /HYSTEROSCOPY/MYOSURE POLYPECTOMY;  Surgeon: Lake Read, MD;  Location: [facility];  Service: Gynecology;  Laterality: N/A;   LEG SURGERY  2007   plate   TONSILLECTOMY     VAGINAL DELIVERY     x2     Past OB/GYN  History: OB History  Gravida Para Term Preterm AB Living  2 2 2   2   SAB IAB Ectopic Multiple Live Births      1    # Outcome Date GA Lbr Len/2nd Weight Sex Type Anes PTL Lv  2 Term 06/21/82   10 lb 4.8 oz (4.672 kg) M Vag-Spont     1 Term 09/18/79   9 lb 1.6 oz (4.128 kg) M Vag-Spont   LIV    Obstetric Comments  1st Menstrual Cycle:  10   1st Pregnancy:  28    Vaginal deliveries: ***,  Forceps/ Vacuum deliveries: ***, Cesarean section: 0 Menopausal: Yes, at age ***, {denies/ admits to:24761} vaginal bleeding since menopause Contraception: s/p menopause. Last pap smear was NILM 02/22/17 ***.  Any history of abnormal pap smears: yes with history of CKC No results found for: DIAGPAP, HPVHIGH, ADEQPAP  Medications: Patient has a current medication list which includes the following prescription(s): acetaminophen , anastrozole, biotin, calcium-magnesium-vitamin d, vitamin d3, coenzyme q10, ibuprofen , intrarosa , multiple vitamins-minerals, polyethyl glycol-propyl glycol, naphazoline-pheniramine, turmeric, and cyanocobalamin.   Allergies: Patient has no known allergies.  Social History:  Social History   Tobacco Use   Smoking status: Never   Smokeless tobacco: Never  Vaping Use   Vaping status: Never Used  Substance Use Topics   Alcohol use: No   Drug use: No    Relationship status: married Patient lives with her husband.   Patient is not employed. Regular exercise: No History of abuse: No  Family History:   Family History  Problem Relation Age of Onset   Alzheimer's disease Mother        deceased 59   Lymphoma Father 62       deceased 31   Heart disease Father    Skin cancer Brother        currently 57   Thyroid disease Son    Hypertension Son    Breast cancer Maternal Aunt 35       deceased 30s   Breast cancer Maternal Aunt 35       deceased 30s   Breast cancer Paternal Aunt        dx 45s; deceased 27s   Colon cancer Neg Hx      Review of Systems:  Review of Systems  Constitutional:  Positive for malaise/fatigue. Negative for fever and weight loss.  Respiratory:  Negative for cough, shortness of breath and wheezing.   Cardiovascular:  Positive for leg swelling. Negative for chest pain and palpitations.  Gastrointestinal:  Positive for constipation. Negative for abdominal pain and blood in stool.  Genitourinary:  Negative for dysuria, frequency, hematuria and urgency.       Discharage  Skin:  Negative for rash.  Neurological:  Negative for dizziness, weakness and headaches.  Endo/Heme/Allergies:  Bruises/bleeds easily.       Hot flashes  Psychiatric/Behavioral:  Positive for depression. The patient is not nervous/anxious.      OBJECTIVE Physical Exam: There were no vitals filed for this visit.  Physical Exam Constitutional:      General: She is not in acute distress.    Appearance: Normal appearance.  Genitourinary:     Bladder and urethral meatus normal.     No lesions in the vagina.     Right Labia: No rash, tenderness, lesions, skin changes or Bartholin's cyst.    Left Labia: No tenderness, lesions, skin changes, Bartholin's cyst or rash.    No vaginal discharge, erythema, tenderness, bleeding, ulceration or granulation tissue.      Right Adnexa: not tender, not full and no mass present.    Left Adnexa: not tender, not full and no mass present.    No cervical motion tenderness, discharge, friability, lesion, polyp or nabothian cyst.     Uterus is not enlarged, fixed, tender or irregular.     No uterine mass detected.    Urethral meatus caruncle not present.    No urethral prolapse, tenderness, mass, hypermobility or discharge present.     Bladder is not tender, urgency on palpation not present and masses not present.      Levator ani not tender, obturator internus not tender, no asymmetrical contractions present and no pelvic spasms present.    Anal wink present and BC reflex present. Cardiovascular:     Rate and  Rhythm: Normal rate.  Pulmonary:     Effort: Pulmonary effort is normal. No respiratory distress.  Abdominal:     General: There is no distension.     Palpations: There is no mass.     Tenderness: There is no abdominal tenderness.     Hernia:  No hernia is present.  Neurological:     Mental Status: She is alert.  Vitals reviewed. Exam conducted with a chaperone present.      POP-Q:   POP-Q                                               Aa                                               Ba                                                 C                                                Gh                                               Pb                                               tvl                                                Ap                                               Bp                                                 D      Rectal Exam:  Normal sphincter tone, {rectocele:24766} distal rectocele, enterocoele {DESC; PRESENT/NOT PRESENT:21021351}, no rectal masses, {sign of:24767} dyssynergia when asking the patient to bear down.  Post-Void Residual (PVR) by Bladder Scan: In order to evaluate bladder emptying, we discussed obtaining a postvoid residual and patient agreed to this procedure.  Procedure: The ultrasound unit was placed on the patient's abdomen in the suprapubic region after the patient had voided.      Laboratory Results: Lab Results  Component Value Date   COLORU Yellow 06/21/2024   CLARITYU cloudy 06/18/2019   GLUCOSEUR Negative 06/18/2019   BILIRUBINUR Negative 06/21/2024   KETONESU Negative 06/21/2024   SPECGRAV <1.005 (L) 06/21/2024   RBCUR negative 06/18/2019   PHUR 5.5 06/21/2024   PROTEINUR Negative 06/21/2024   UROBILINOGEN  negative (A) 06/18/2019   LEUKOCYTESUR 1+ (A) 06/21/2024    Lab Results  Component Value Date   CREATININE 0.77 11/21/2023   CREATININE 0.67 12/21/2018   CREATININE 0.79 04/09/2018    Lab  Results  Component Value Date   HGBA1C 6.3 (H) 01/30/2024    Lab Results  Component Value Date   HGB 13.5 12/21/2018     ASSESSMENT AND PLAN Ms. Markes is a 73 y.o. with: No diagnosis found.  There are no diagnoses linked to this encounter.   Lianne ONEIDA Gillis, MD

## 2024-07-12 ENCOUNTER — Ambulatory Visit: Admitting: Obstetrics

## 2024-07-12 ENCOUNTER — Encounter: Payer: Self-pay | Admitting: Obstetrics

## 2024-07-12 VITALS — BP 136/84 | HR 66 | Ht 60.63 in | Wt 203.2 lb

## 2024-07-12 DIAGNOSIS — N8110 Cystocele, unspecified: Secondary | ICD-10-CM

## 2024-07-12 DIAGNOSIS — N950 Postmenopausal bleeding: Secondary | ICD-10-CM | POA: Diagnosis not present

## 2024-07-12 DIAGNOSIS — R198 Other specified symptoms and signs involving the digestive system and abdomen: Secondary | ICD-10-CM

## 2024-07-12 DIAGNOSIS — Z4689 Encounter for fitting and adjustment of other specified devices: Secondary | ICD-10-CM

## 2024-07-12 DIAGNOSIS — Z8742 Personal history of other diseases of the female genital tract: Secondary | ICD-10-CM

## 2024-07-12 DIAGNOSIS — N393 Stress incontinence (female) (male): Secondary | ICD-10-CM | POA: Diagnosis not present

## 2024-07-12 DIAGNOSIS — N952 Postmenopausal atrophic vaginitis: Secondary | ICD-10-CM

## 2024-07-12 DIAGNOSIS — Z6838 Body mass index (BMI) 38.0-38.9, adult: Secondary | ICD-10-CM

## 2024-07-12 DIAGNOSIS — E669 Obesity, unspecified: Secondary | ICD-10-CM

## 2024-07-12 LAB — POCT URINALYSIS DIP (CLINITEK)
Bilirubin, UA: NEGATIVE
Blood, UA: NEGATIVE
Glucose, UA: NEGATIVE mg/dL
Ketones, POC UA: NEGATIVE mg/dL
Nitrite, UA: NEGATIVE
POC PROTEIN,UA: NEGATIVE
Spec Grav, UA: 1.01 (ref 1.010–1.025)
Urobilinogen, UA: 0.2 U/dL
pH, UA: 6.5 (ref 5.0–8.0)

## 2024-07-12 NOTE — Assessment & Plan Note (Addendum)
-   reports history of CKC  - denies pap smear since NILM 02/22/17 - referral to GYN to establish care due to history of abnormal pap smear and PMB. No supplies available for pap smear in the office today - discussed risks and benefits of uterine preservation if she desires to proceed with prolapse surgery

## 2024-07-12 NOTE — Assessment & Plan Note (Addendum)
-   POCT UA + leuk, PVR 13mL - worsened after pessary use, discussed occult SUI - For treatment of stress urinary incontinence,  non-surgical options include expectant management, weight loss, physical therapy, as well as a pessary.  Surgical options include a midurethral sling, Burch urethropexy, and transurethral injection of a bulking agent. - monitor symptoms after pessary replacement

## 2024-07-12 NOTE — Assessment & Plan Note (Signed)
-   avoids low dose vaginal estrogen due to h/o anastrozole use for ER + breast invasive ductal carcinoma s/p lumpectomy on 2019 - continue intrarosa , encouraged use 2x/week - pt reports burning during vaginal exam with lubrication and lidocaine  use - consider Vit E suppositories - consider low dose vaginal estrogen if refractory symptoms

## 2024-07-12 NOTE — Assessment & Plan Note (Signed)
-   uses magnesium liquid OTC 2x/week for the past few years when she experiences need to strain - we reviewed the importance of a better bowel regimen.  We also discussed the importance of avoiding chronic straining, as it can exacerbate her pelvic floor symptoms; we discussed treating constipation and straining prior to surgery, as postoperative straining can lead to damage to the repair and recurrence of symptoms. We discussed initiating therapy with increasing fluid intake, fiber supplementation, stool softeners, and laxatives such as miralax.  - encouraged titration of miralax to replace magnesium use and start squatting position for defecation to allow adequate pelvic floor relaxation  - consider pessary refitting with size 2 3/4 gellhorn or smaller vs. Cube pessary to r/o outlet obstruction from pessary

## 2024-07-12 NOTE — Assessment & Plan Note (Addendum)
-   desires to replace 3 short stem gellhorn due to longest duration of symptomatic relief, unable to self manage and previously pessary checks every 6 months - For treatment of pelvic organ prolapse, we discussed options for management including expectant management, conservative management, and surgical management, such as Kegels, a pessary, pelvic floor physical therapy, and specific surgical procedures. - desires to avoid surgery since she is the primary caregiver for her husband with dementia, however unsure if she is able to continue pessary checks as she gets older - discussed vaginal closure procedure   - pros - strong, good long-term success   - cons - unable to have penetrative intercourse - however recommended hysterectomy due to history of abnormal pap smear - encouraged to return for pessary refitting

## 2024-07-12 NOTE — Patient Instructions (Addendum)
 You have a stage 2 (out of 4) prolapse.  We discussed the fact that it is not life threatening but there are several treatment options. For treatment of pelvic organ prolapse, we discussed options for management including expectant management, conservative management, and surgical management, such as Kegels, a pessary, pelvic floor physical therapy, and specific surgical procedures.     For 3 short stem gellhorn pessary use: - discussed risk of change in urinary or bowel symptoms, vaginal ulceration, discharge, bleeding, fistula formation. Explained that you may require multiple sizes and types for fitting.   For treatment of stress urinary incontinence, which is leakage with physical activity/movement/strainging/coughing, we discussed expectant management versus nonsurgical options versus surgery. Nonsurgical options include weight loss, physical therapy, as well as a pessary.  Surgical options include a midurethral sling, which is a synthetic mesh sling that acts like a hammock under the urethra to prevent leakage of urine, a Burch urethropexy, and transurethral injection of a bulking agent.   Women should try to eat at least 21 to 25 grams of fiber a day, while men should aim for 30 to 38 grams a day. You can add fiber to your diet with food or a fiber supplement such as psyllium (metamucil), benefiber, or fibercon.   Here's a look at how much dietary fiber is found in some common foods. When buying packaged foods, check the Nutrition Facts label for fiber content. It can vary among brands.  Fruits Serving size Total fiber (grams)*  Raspberries 1 cup 8.0  Pear 1 medium 5.5  Apple, with skin 1 medium 4.5  Banana 1 medium 3.0  Orange 1 medium 3.0  Strawberries 1 cup 3.0   Vegetables Serving size Total fiber (grams)*  Green peas, boiled 1 cup 9.0  Broccoli, boiled 1 cup chopped 5.0  Turnip greens, boiled 1 cup 5.0  Brussels sprouts, boiled 1 cup 4.0  Potato, with skin, baked 1 medium 4.0   Sweet corn, boiled 1 cup 3.5  Cauliflower, raw 1 cup chopped 2.0  Carrot, raw 1 medium 1.5   Grains Serving size Total fiber (grams)*  Spaghetti, whole-wheat, cooked 1 cup 6.0  Barley, pearled, cooked 1 cup 6.0  Bran flakes 3/4 cup 5.5  Quinoa, cooked 1 cup 5.0  Oat bran muffin 1 medium 5.0  Oatmeal, instant, cooked 1 cup 5.0  Popcorn, air-popped 3 cups 3.5  Brown rice, cooked 1 cup 3.5  Bread, whole-wheat 1 slice 2.0  Bread, rye 1 slice 2.0   Legumes, nuts and seeds Serving size Total fiber (grams)*  Split peas, boiled 1 cup 16.0  Lentils, boiled 1 cup 15.5  Black beans, boiled 1 cup 15.0  Baked beans, canned 1 cup 10.0  Chia seeds 1 ounce 10.0  Almonds 1 ounce (23 nuts) 3.5  Pistachios 1 ounce (49 nuts) 3.0  Sunflower kernels 1 ounce 3.0  *Rounded to nearest 0.5 gram. Source: [employer] for [employer], Legacy Release    Continue Intrarosa  2x/week

## 2024-07-12 NOTE — Assessment & Plan Note (Signed)
-   TVUS 09/19/23 with 6.9 x 4.5 x 3.4 cm uterus and 3.41mm endometrial stripe - denies bleeding since TVUS - referral to GYN to establish care

## 2024-07-12 NOTE — Assessment & Plan Note (Addendum)
-   failed several pessaries: size 3 incontinence ring with support, 3 long stem gellhorn, size 5 and 6 ring with support, size 2 and 3 donut  - replaced 3 short stem gellhorn per pt request, encouraged to monitor for symptoms of outlet obstruction such as difficulty with urination or defecation - discussed risk of change in urinary or bowel symptoms, vaginal ulceration, discharge, bleeding, fistula formation. Explained that pt may require multiple sizes and types for fitting.  - discussed need for every 3 month pessary checks if she is unable to self manage pessary

## 2024-07-12 NOTE — Assessment & Plan Note (Signed)
-   discussed association with pelvic floor disorders - encouraged diet modification and active lifestyle

## 2024-07-26 ENCOUNTER — Ambulatory Visit: Admitting: Obstetrics and Gynecology

## 2024-07-29 ENCOUNTER — Encounter: Payer: Self-pay | Admitting: Obstetrics and Gynecology

## 2024-07-29 ENCOUNTER — Ambulatory Visit: Admitting: Obstetrics and Gynecology

## 2024-07-29 VITALS — BP 124/81 | HR 96

## 2024-07-29 DIAGNOSIS — N952 Postmenopausal atrophic vaginitis: Secondary | ICD-10-CM

## 2024-07-29 DIAGNOSIS — N8110 Cystocele, unspecified: Secondary | ICD-10-CM

## 2024-07-29 DIAGNOSIS — N812 Incomplete uterovaginal prolapse: Secondary | ICD-10-CM

## 2024-07-29 NOTE — Progress Notes (Signed)
 [facility]   Subjective:     Chief Complaint: Follow-up Stacy Warren is a 73 y.o. female here today for pessary fitting.)  History of Present Illness: Stacy Warren is a 73 y.o. female with stage II pelvic organ prolapse who presents today for a pessary fitting.    Past Medical History: Patient  has a past medical history of Aromatase inhibitor use (06/11/2020), Arthritis, Breast cancer (HCC), Cancer (HCC), Glaucoma, Motion sickness, Osteopenia (11/28/2018), and Pulmonary embolism (HCC) (04/09/2018).   Past Surgical History: She  has a past surgical history that includes Hip pinning (2007); Leg Surgery (2007); Tonsillectomy; bunionectomy (Left); Vaginal delivery; Cervical cone biopsy (8015); Hysteroscopy with D & C (N/A, 06/07/2018); Dilation and curettage of uterus (06/07/2018); Breast lumpectomy with needle localization and axillary sentinel lymph node bx (Right, 12/31/2018); Breast biopsy (Right, 12/13/2018); Breast lumpectomy (Right, 12/31/2018); Cataract extraction w/PHACO (Left, 10/12/2022); Cataract extraction w/PHACO (Right, 10/26/2022); Colonoscopy with propofol  (N/A, 01/23/2024); biopsy (01/23/2024); and Eye surgery.   Medications: She has a current medication list which includes the following prescription(s): acetaminophen , anastrozole, biotin, calcium-magnesium-vitamin d, vitamin d3, coenzyme q10, ibuprofen , intrarosa , multiple vitamins-minerals, polyethyl glycol-propyl glycol, naphazoline-pheniramine, turmeric, and cyanocobalamin.   Allergies: Patient has no known allergies.   Social History: Patient  reports that she has never smoked. She has never used smokeless tobacco. She reports that she does not drink alcohol and does not use drugs.      Objective:    BP 124/81   Pulse 96  Gen: No apparent distress, A&O x 3. Pelvic Exam: Normal external female genitalia; Bartholin's and Skene's glands normal in appearance; urethral meatus normal in  appearance, no urethral masses or discharge.   A size #7 Shattz  pessary was fitted. It was comfortable, stayed in place with valsalva and was an appropriate size on examination, with one finger fitting between the pessary and the vaginal walls. We tied a string to it and the patient demonstrated proper removal and replacement.    Assessment/Plan:    Assessment: Ms. Bibb is a 73 y.o. with stage II pelvic organ prolapse who presents for a pessary fitting. Plan: She was fitted with a #7 Shatz pessary. She will keep the pessary in place until next visit. She will use lubricant.   Follow-up in 3 months for a pessary check or sooner as needed.  All questions were answered.    Kamyia Thomason G Carlee Tesfaye, NP

## 2024-07-29 NOTE — Patient Instructions (Signed)
 Please use your intrarosa  and some sort of moisture support. Vitamin E cream, aloe vera, and coconut oil are all good options.   If you have any issues with the pessary please call.   If you want we can practice taking the pessary in and out next time.   For your skin you want something with Hyaluronic acid in it. It give support to the tissues.

## 2024-07-30 ENCOUNTER — Encounter: Payer: Self-pay | Admitting: Pediatrics

## 2024-07-30 ENCOUNTER — Ambulatory Visit: Payer: Medicare PPO | Admitting: Pediatrics

## 2024-07-30 VITALS — BP 129/84 | HR 77 | Temp 97.4°F | Wt 202.4 lb

## 2024-07-30 DIAGNOSIS — M1712 Unilateral primary osteoarthritis, left knee: Secondary | ICD-10-CM

## 2024-07-30 DIAGNOSIS — N8110 Cystocele, unspecified: Secondary | ICD-10-CM

## 2024-07-30 DIAGNOSIS — Z170 Estrogen receptor positive status [ER+]: Secondary | ICD-10-CM | POA: Diagnosis not present

## 2024-07-30 DIAGNOSIS — C50311 Malignant neoplasm of lower-inner quadrant of right female breast: Secondary | ICD-10-CM

## 2024-07-30 NOTE — Patient Instructions (Signed)
 Will call about mammogram

## 2024-07-30 NOTE — Progress Notes (Signed)
 Office Visit  BP 129/84   Pulse 77   Temp (!) 97.4 F (36.3 C) (Oral)   Wt 202 lb 6.4 oz (91.8 kg)   SpO2 98%   BMI 38.71 kg/m    Subjective:    Patient ID: Stacy Warren, female    DOB: 1951/04/13, 73 y.o.   MRN: [id number]  HPI: Stacy Warren is a 73 y.o. female  Chief Complaint  Patient presents with   Follow-up    Patient states things are getting better new pessary was placed yesturday     Discussed the use of AI scribe software for clinical note transcription with the patient, who gave verbal consent to proceed.  History of Present Illness   Stacy Warren is a 73 year old female who presents for follow-up on her pessary and knee pain management.  A few weeks ago, she had a pessary inserted, which initially caused discomfort due to rubbing against her rectum. A new pessary was inserted yesterday, and she now feels much better. She experiences urinary tract infections when her bladder is prolapsed despite preventive efforts.  She discusses knee pain, particularly in the left knee, which is the most severe. Her knees have been painful for a while, and she is bowlegged, which exacerbates the pain. She is considering the timing of potential surgery, contemplating either fall or after the new year due to personal logistics and seasonal activities.  She has a history of glaucoma surgery a couple of years ago and currently experiences hazy vision. At a recent eye examination, she was told she should return to [facility] for a laser procedure, and she has a consultation scheduled for next week.  She recently had a mammogram in June 2025 and has a history of breast cancer, for which she was on a chemo pill until May 2025. She prefers to avoid certain medical facilities due to past experiences and would like to stay closer to home for her follow-up care.  She is actively involved in yard work and prefers to manage her own tasks, with her children assisting with heavier  work like weed eating. No current use of prescription medications, only supplements.      Relevant past medical, surgical, family and social history reviewed and updated as indicated. Interim medical history since our last visit reviewed. Allergies and medications reviewed and updated.  ROS per HPI unless specifically indicated above     Objective:    BP 129/84   Pulse 77   Temp (!) 97.4 F (36.3 C) (Oral)   Wt 202 lb 6.4 oz (91.8 kg)   SpO2 98%   BMI 38.71 kg/m   Wt Readings from Last 3 Encounters:  07/30/24 202 lb 6.4 oz (91.8 kg)  07/12/24 203 lb 3.2 oz (92.2 kg)  06/21/24 205 lb 12.8 oz (93.4 kg)     Physical Exam Constitutional:      Appearance: Normal appearance.  Pulmonary:     Effort: Pulmonary effort is normal.  Musculoskeletal:        General: Normal range of motion.  Skin:    Comments: Normal skin color  Neurological:     General: No focal deficit present.     Mental Status: She is alert. Mental status is at baseline.  Psychiatric:        Mood and Affect: Mood normal.        Behavior: Behavior normal.        Thought Content: Thought content normal.  07/31/2024    8:00 AM 06/21/2024   11:10 AM 06/11/2024    2:50 PM 01/30/2024    1:13 PM 11/21/2023    2:05 PM  Depression screen PHQ 2/9  Decreased Interest 1 1 1 3 1   Down, Depressed, Hopeless 1 1 1 3 1   PHQ - 2 Score 2 2 2 6 2   Altered sleeping 0 0 0 0 1  Tired, decreased energy 1 1 1 1 1   Change in appetite 0 0 0 0 0  Feeling bad or failure about yourself  0 0 0 0 0  Trouble concentrating 0 0 0 0 0  Moving slowly or fidgety/restless 0 0 0 0 0  Suicidal thoughts 0 0 0 0 0  PHQ-9 Score 3 3 3 7 4   Difficult doing work/chores Not difficult at all Not difficult at all Somewhat difficult Not difficult at all Not difficult at all       07/31/2024    8:00 AM 06/21/2024   11:10 AM 01/30/2024    1:14 PM 11/21/2023    2:05 PM  GAD 7 : Generalized Anxiety Score  Nervous, Anxious, on Edge 0 1 1 1    Control/stop worrying 0 0 1 1  Worry too much - different things 0 0 1 1  Trouble relaxing 0 0 0 0  Restless 0 0 0 0  Easily annoyed or irritable 0 0 1 0  Afraid - awful might happen 0 1 0 0  Total GAD 7 Score 0 2 4 3   Anxiety Difficulty Not difficult at all Not difficult at all Not difficult at all Not difficult at all       Assessment & Plan:  Assessment & Plan   Pelvic organ prolapse quantification stage 2 cystocele Assessment & Plan: Resolved pessary discomfort with new insertion. Symptoms have improved.   Primary osteoarthritis of left knee Assessment & Plan: Chronic osteoarthritis causes significant pain and functional limitation. Discuss earlier surgical intervention to improve quality of life. Consult with an orthopedic surgeon about potential surgery in the fall.   Malignant neoplasm of lower-inner quadrant of right breast of female, estrogen receptor positive (HCC) Assessment & Plan: Post-chemotherapy surveillance is necessary. Prefers alternative imaging centers due to past issues.Recommending routine mammo in 1 year (2026). Declines referrals to [facility]; consider [place] or [facility] for future imaging.    Follow up plan: Return in about 6 months (around 01/30/2025) for Physical.  Stacy SHAUNNA Nett, MD

## 2024-08-04 ENCOUNTER — Encounter: Payer: Self-pay | Admitting: Pediatrics

## 2024-08-04 NOTE — Assessment & Plan Note (Signed)
 Resolved pessary discomfort with new insertion. Symptoms have improved.

## 2024-08-04 NOTE — Assessment & Plan Note (Signed)
 Chronic osteoarthritis causes significant pain and functional limitation. Discuss earlier surgical intervention to improve quality of life. Consult with an orthopedic surgeon about potential surgery in the fall.

## 2024-08-04 NOTE — Assessment & Plan Note (Signed)
 Post-chemotherapy surveillance is necessary. Prefers alternative imaging centers due to past issues. Contact [facility] for breast cancer surveillance recommendations and request recent mammogram results from June 2023. Avoid referrals to [facility]; consider [place] or [facility] for future imaging.

## 2024-08-05 ENCOUNTER — Ambulatory Visit: Admitting: Obstetrics and Gynecology

## 2024-08-29 ENCOUNTER — Ambulatory Visit: Admitting: Obstetrics and Gynecology

## 2024-09-02 DIAGNOSIS — H26492 Other secondary cataract, left eye: Secondary | ICD-10-CM | POA: Diagnosis not present

## 2024-09-24 DIAGNOSIS — M1712 Unilateral primary osteoarthritis, left knee: Secondary | ICD-10-CM | POA: Diagnosis not present

## 2024-09-24 DIAGNOSIS — Z86711 Personal history of pulmonary embolism: Secondary | ICD-10-CM | POA: Diagnosis not present

## 2024-09-28 NOTE — Discharge Instructions (Signed)

## 2024-09-30 ENCOUNTER — Telehealth: Payer: Self-pay

## 2024-09-30 NOTE — Telephone Encounter (Signed)
 FYI to provider.   Copied from CRM #[id number]. Topic: General - Other >> Sep 30, 2024  9:55 AM Cleave MATSU wrote: Reason for CRM: pt said she is having knee surgery scheduled for oct 17th and she just wanted to let Dr.Santos know.

## 2024-10-01 ENCOUNTER — Encounter
Admission: RE | Admit: 2024-10-01 | Discharge: 2024-10-01 | Disposition: A | Discharge status: Home / Self Care | Source: Ambulatory Visit | Attending: Orthopedic Surgery | Admitting: Orthopedic Surgery

## 2024-10-01 ENCOUNTER — Other Ambulatory Visit: Payer: Self-pay

## 2024-10-01 VITALS — BP 143/76 | HR 63 | Resp 16 | Ht 62.0 in | Wt 202.0 lb

## 2024-10-01 DIAGNOSIS — R8281 Pyuria: Secondary | ICD-10-CM | POA: Insufficient documentation

## 2024-10-01 DIAGNOSIS — R8271 Bacteriuria: Secondary | ICD-10-CM | POA: Diagnosis not present

## 2024-10-01 DIAGNOSIS — N182 Chronic kidney disease, stage 2 (mild): Secondary | ICD-10-CM | POA: Diagnosis not present

## 2024-10-01 DIAGNOSIS — N950 Postmenopausal bleeding: Secondary | ICD-10-CM | POA: Diagnosis not present

## 2024-10-01 DIAGNOSIS — Z01818 Encounter for other preprocedural examination: Secondary | ICD-10-CM | POA: Diagnosis not present

## 2024-10-01 DIAGNOSIS — R8290 Unspecified abnormal findings in urine: Secondary | ICD-10-CM | POA: Insufficient documentation

## 2024-10-01 DIAGNOSIS — M1712 Unilateral primary osteoarthritis, left knee: Secondary | ICD-10-CM | POA: Diagnosis not present

## 2024-10-01 LAB — URINALYSIS, ROUTINE W REFLEX MICROSCOPIC
Bilirubin Urine: NEGATIVE
Glucose, UA: NEGATIVE mg/dL
Hgb urine dipstick: NEGATIVE
Ketones, ur: NEGATIVE mg/dL
Nitrite: NEGATIVE
Protein, ur: NEGATIVE mg/dL
RBC / HPF: 0 RBC/hpf (ref 0–5)
Specific Gravity, Urine: 1.005 (ref 1.005–1.030)
pH: 7 (ref 5.0–8.0)

## 2024-10-01 LAB — C-REACTIVE PROTEIN: CRP: 0.6 mg/dL (ref <1.0)

## 2024-10-01 LAB — COMPREHENSIVE METABOLIC PANEL WITH GFR
ALT: 15 U/L (ref 0–44)
AST: 22 U/L (ref 15–41)
Albumin: 4.2 g/dL (ref 3.5–5.0)
Alkaline Phosphatase: 69 U/L (ref 38–126)
Anion gap: 10 (ref 5–15)
BUN: 16 mg/dL (ref 8–23)
CO2: 26 mmol/L (ref 22–32)
Calcium: 9.5 mg/dL (ref 8.9–10.3)
Chloride: 101 mmol/L (ref 98–111)
Creatinine, Ser: 0.57 mg/dL (ref 0.44–1.00)
GFR, Estimated: >60 mL/min (ref >60)
Glucose, Bld: 100 mg/dL — ABNORMAL HIGH (ref 70–99)
Potassium: 3.8 mmol/L (ref 3.5–5.1)
Sodium: 137 mmol/L (ref 135–145)
Total Bilirubin: 0.7 mg/dL (ref 0.0–1.2)
Total Protein: 7.8 g/dL (ref 6.5–8.1)

## 2024-10-01 LAB — CBC
HCT: 41 % (ref 36.0–46.0)
Hemoglobin: 13.7 g/dL (ref 12.0–15.0)
MCH: 28.8 pg (ref 26.0–34.0)
MCHC: 33.4 g/dL (ref 30.0–36.0)
MCV: 86.1 fL (ref 80.0–100.0)
Platelets: 214 K/uL (ref 150–400)
RBC: 4.76 MIL/uL (ref 3.87–5.11)
RDW: 14.2 % (ref 11.5–15.5)
WBC: 4.9 K/uL (ref 4.0–10.5)
nRBC: 0 % (ref 0.0–0.2)

## 2024-10-01 LAB — SURGICAL PCR SCREEN
MRSA, PCR: NEGATIVE
Staphylococcus aureus: POSITIVE — AB

## 2024-10-01 LAB — SEDIMENTATION RATE: Sed Rate: 29 mm/h (ref 0–30)

## 2024-10-01 NOTE — Patient Instructions (Addendum)
 Your procedure is scheduled on: Friday 10/11/24 Report to the Registration Desk on the 1st floor of the [facility]. To find out your arrival time, please call [phone] between 1PM - 3PM on: Thursday 10/10/24 If your arrival time is 6:00 am, do not arrive before that time as the [facility] Mall entrance doors do not open until 6:00 am.  REMEMBER: Instructions that are not followed completely may result in serious medical risk, up to and including death; or upon the discretion of your surgeon and anesthesiologist your surgery may need to be rescheduled.  Do not eat food after midnight the night before surgery.  No gum chewing or hard candies.  You may however, drink CLEAR liquids up to 2 hours before you are scheduled to arrive for your surgery. Do not drink anything within 2 hours of your scheduled arrival time.  Clear liquids include: - water   - apple juice without pulp - gatorade (not RED colors) - black coffee or tea (Do NOT add milk or creamers to the coffee or tea) Do NOT drink anything that is not on this list.  **Type 1 and Type 2 diabetics should only drink water .**  In addition, your doctor has ordered for you to drink the provided:  Ensure Pre-Surgery Clear Carbohydrate Drink  Drinking this carbohydrate drink up to two hours before surgery helps to reduce insulin resistance and improve patient outcomes. Please complete drinking 2 hours before scheduled arrival time.  One week prior to surgery: Stop Anti-inflammatories (NSAIDS) such as Advil , Aleve, Ibuprofen , Motrin , Naproxen, Naprosyn and Aspirin based products such as Excedrin, Goody's Powder, BC Powder. You may however, continue to take Tylenol  if needed for pain up until the day of surgery.  Stop ANY OVER THE COUNTER supplements and vitamins for 7 days before surgery.   Continue taking all of your other prescription medications up until the day of surgery.  ON THE DAY OF SURGERY ONLY TAKE THESE MEDICATIONS WITH  SIPS OF WATER :  None however you can take your allergy medication and use your Flonase if needed  No Alcohol for 24 hours before or after surgery.  No Smoking including e-cigarettes for 24 hours before surgery.  No chewable tobacco products for at least 6 hours before surgery.  No nicotine patches on the day of surgery.  Do not use any recreational drugs for at least a week (preferably 2 weeks) before your surgery.  Please be advised that the combination of cocaine and anesthesia may have negative outcomes, up to and including death. If you test positive for cocaine, your surgery will be cancelled.  On the morning of surgery brush your teeth with toothpaste and water , you may rinse your mouth with mouthwash if you wish. Do not swallow any toothpaste or mouthwash.  Use CHG Soap or wipes as directed on instruction sheet.  Do not shave body hair from the neck down 48 hours before surgery.  Do not wear lotions, powders, or perfumes.   Do not wear jewelry, make-up, hairpins, clips or nail polish or toenail pilosh.  For welded (permanent) jewelry: bracelets, anklets, waist bands, etc.  Please have this removed prior to surgery.  If it is not removed, there is a chance that hospital personnel will need to cut it off on the day of surgery.  Contact lenses, hearing aids and dentures may not be worn into surgery.  Do not bring valuables to the hospital. [facility] is not responsible for any missing/lost belongings or valuables.   Notify your  doctor [facility]) if there is any change in your medical condition (cold, fever, infection).  Wear comfortable clothing (specific to your surgery type) to the hospital.  After surgery, you can help prevent lung complications by doing breathing exercises.  Take deep breaths and cough every 1-2 hours. Your doctor may order a device called an Incentive Spirometer to help you take deep breaths.  If you are being admitted to the hospital overnight, leave  your suitcase in the car. After surgery it may be brought to your room.  In case of increased patient census, it may be necessary for you, the patient, to continue your postoperative care in the Same Day Surgery department.  Please call the Pre-admissions Testing Dept. at [phone] if you have any questions about these instructions.  Surgery Visitation Policy:  Patients having surgery or a procedure may have two visitors.  Children under the age of 41 must have an adult with them who is not the patient.  Inpatient Visitation:    Visiting hours are 7 a.m. to 8 p.m. Up to four visitors are allowed at one time in a patient room. The visitors may rotate out with other people during the day.  One visitor age 26 or older may stay with the patient overnight and must be in the room by 8 p.m.   Merchandiser, retail to address health-related social needs:  [email or web address]    Pre-operative 4 CHG Bath Instructions   You can play a key role in reducing the risk of infection after surgery. Your skin needs to be as free of germs as possible. You can reduce the number of germs on your skin by washing with CHG (chlorhexidine  gluconate) soap before surgery. CHG is an antiseptic soap that kills germs and continues to kill germs even after washing.   DO NOT use if you have an allergy to chlorhexidine /CHG or antibacterial soaps. If your skin becomes reddened or irritated, stop using the CHG and notify one of our RNs at [phone].   Please shower with the CHG soap starting 4 days before surgery using the following schedule:   Monday 10/07/24 - Friday 10/11/24    Please keep in mind the following:  DO NOT shave, including legs and underarms, starting the day of your first shower.   You may shave your face at any point before/day of surgery.  Place clean sheets on your bed the day you start using CHG soap. Use a clean washcloth (not used since being washed) for each  shower. DO NOT sleep with pets once you start using the CHG.   CHG Shower Instructions:  If you choose to wash your hair and private area, wash first with your normal shampoo/soap.  After you use shampoo/soap, rinse your hair and body thoroughly to remove shampoo/soap residue.  Turn the water  OFF and apply about 3 tablespoons (45 ml) of CHG soap to a CLEAN washcloth.  Apply CHG soap ONLY FROM YOUR NECK DOWN TO YOUR TOES (washing for 3-5 minutes)  DO NOT use CHG soap on face, private areas, open wounds, or sores.  Pay special attention to the area where your surgery is being performed.  If you are having back surgery, having someone wash your back for you may be helpful. Wait 2 minutes after CHG soap is applied, then you may rinse off the CHG soap.  Pat dry with a clean towel  Put on clean clothes/pajamas   If you choose to wear lotion, please use ONLY the CHG-compatible lotions  on the back of this paper.     Additional instructions for the day of surgery: DO NOT APPLY any lotions, deodorants, cologne, or perfumes.   Put on clean/comfortable clothes.  Brush your teeth.  Ask your nurse before applying any prescription medications to the skin.      CHG Compatible Lotions   Aveeno Moisturizing lotion  Cetaphil Moisturizing Cream  Cetaphil Moisturizing Lotion  Clairol Herbal Essence Moisturizing Lotion, Dry Skin  Clairol Herbal Essence Moisturizing Lotion, Extra Dry Skin  Clairol Herbal Essence Moisturizing Lotion, Normal Skin  Curel Age Defying Therapeutic Moisturizing Lotion with Alpha Hydroxy  Curel Extreme Care Body Lotion  Curel Soothing Hands Moisturizing Hand Lotion  Curel Therapeutic Moisturizing Cream, Fragrance-Free  Curel Therapeutic Moisturizing Lotion, Fragrance-Free  Curel Therapeutic Moisturizing Lotion, Original Formula  Eucerin Daily Replenishing Lotion  Eucerin Dry Skin Therapy Plus Alpha Hydroxy Crme  Eucerin Dry Skin Therapy Plus Alpha Hydroxy Lotion   Eucerin Original Crme  Eucerin Original Lotion  Eucerin Plus Crme Eucerin Plus Lotion  Eucerin TriLipid Replenishing Lotion  Keri Anti-Bacterial Hand Lotion  Keri Deep Conditioning Original Lotion Dry Skin Formula Softly Scented  Keri Deep Conditioning Original Lotion, Fragrance Free Sensitive Skin Formula  Keri Lotion Fast Absorbing Fragrance Free Sensitive Skin Formula  Keri Lotion Fast Absorbing Softly Scented Dry Skin Formula  Keri Original Lotion  Keri Skin Renewal Lotion Keri Silky Smooth Lotion  Keri Silky Smooth Sensitive Skin Lotion  Nivea Body Creamy Conditioning Oil  Nivea Body Extra Enriched Lotion  Nivea Body Original Lotion  Nivea Body Sheer Moisturizing Lotion Nivea Crme  Nivea Skin Firming Lotion  NutraDerm 30 Skin Lotion  NutraDerm Skin Lotion  NutraDerm Therapeutic Skin Cream  NutraDerm Therapeutic Skin Lotion  ProShield Protective Hand Cream  Provon moisturizing lotion  How to Use an Incentive Spirometer  An incentive spirometer is a tool that measures how well you are filling your lungs with each breath. Learning to take long, deep breaths using this tool can help you keep your lungs clear and active. This may help to reverse or lessen your chance of developing breathing (pulmonary) problems, especially infection. You may be asked to use a spirometer: After a surgery. If you have a lung problem or a history of smoking. After a long period of time when you have been unable to move or be active. If the spirometer includes an indicator to show the highest number that you have reached, your health care provider or respiratory therapist will help you set a goal. Keep a log of your progress as told by your health care provider. What are the risks? Breathing too quickly may cause dizziness or cause you to pass out. Take your time so you do not get dizzy or light-headed. If you are in pain, you may need to take pain medicine before doing incentive spirometry. It is  harder to take a deep breath if you are having pain. How to use your incentive spirometer  Sit up on the edge of your bed or on a chair. Hold the incentive spirometer so that it is in an upright position. Before you use the spirometer, breathe out normally. Place the mouthpiece in your mouth. Make sure your lips are closed tightly around it. Breathe in slowly and as deeply as you can through your mouth, causing the piston or the ball to rise toward the top of the chamber. Hold your breath for 3-5 seconds, or for as long as possible. If the spirometer includes a coach indicator,  use this to guide you in breathing. Slow down your breathing if the indicator goes above the marked areas. Remove the mouthpiece from your mouth and breathe out normally. The piston or ball will return to the bottom of the chamber. Rest for a few seconds, then repeat the steps 10 or more times. Take your time and take a few normal breaths between deep breaths so that you do not get dizzy or light-headed. Do this every 1-2 hours when you are awake. If the spirometer includes a goal marker to show the highest number you have reached (best effort), use this as a goal to work toward during each repetition. After each set of 10 deep breaths, cough a few times. This will help to make sure that your lungs are clear. If you have an incision on your chest or abdomen from surgery, place a pillow or a rolled-up towel firmly against the incision when you cough. This can help to reduce pain while taking deep breaths and coughing. General tips When you are able to get out of bed: Walk around often. Continue to take deep breaths and cough in order to clear your lungs. Keep using the incentive spirometer until your health care provider says it is okay to stop using it. If you have been in the hospital, you may be told to keep using the spirometer at home. Contact a health care provider if: You are having difficulty using the  spirometer. You have trouble using the spirometer as often as instructed. Your pain medicine is not giving enough relief for you to use the spirometer as told. You have a fever. Get help right away if: You develop shortness of breath. You develop a cough with bloody mucus from the lungs. You have fluid or blood coming from an incision site after you cough. Summary An incentive spirometer is a tool that can help you learn to take long, deep breaths to keep your lungs clear and active. You may be asked to use a spirometer after a surgery, if you have a lung problem or a history of smoking, or if you have been inactive for a long period of time. Use your incentive spirometer as instructed every 1-2 hours while you are awake. If you have an incision on your chest or abdomen, place a pillow or a rolled-up towel firmly against your incision when you cough. This will help to reduce pain. Get help right away if you have shortness of breath, you cough up bloody mucus, or blood comes from your incision when you cough. This information is not intended to replace advice given to you by your health care provider. Make sure you discuss any questions you have with your health care provider. Document Revised: 03/02/2020 Document Reviewed: 03/02/2020 Elsevier Patient Education  2023 [employer].

## 2024-10-02 ENCOUNTER — Ambulatory Visit: Payer: Self-pay | Admitting: Urgent Care

## 2024-10-02 NOTE — Progress Notes (Signed)
  [facility] Perioperative Services: Pre-Admission/Anesthesia Testing  Abnormal Lab Notification   Date: 10/02/24  Name: Stacy Warren MRN:   [id number]  Re: Abnormal labs noted during PAT appointment   Notified:  Provider Name Provider Role Notification Mode  Mardee Agent, MD Orthopedics (Surgeon) Routed and/or faxed via [facility]   Abnormal Lab Value(s):   Lab Results  Component Value Date   COLORURINE YELLOW (A) 10/01/2024   APPEARANCEUR CLEAR (A) 10/01/2024   LABSPEC 1.005 10/01/2024   PHURINE 7.0 10/01/2024   GLUCOSEU NEGATIVE 10/01/2024   HGBUR NEGATIVE 10/01/2024   BILIRUBINUR NEGATIVE 10/01/2024   KETONESUR NEGATIVE 10/01/2024   PROTEINUR NEGATIVE 10/01/2024   UROBILINOGEN 0.2 07/12/2024   NITRITE NEGATIVE 10/01/2024   LEUKOCYTESUR MODERATE (A) 10/01/2024   EPIU 0-5 10/01/2024   WBCU 0-5 10/01/2024   RBCU 0 10/01/2024   BACTERIA MANY (A) 10/01/2024   CULT (A) 10/01/2024    >=100,000 COLONIES/mL GRAM NEGATIVE RODS IDENTIFICATION AND SUSCEPTIBILITIES TO FOLLOW Performed at [facility], [address], [place], [place] [address]     Clinical Information and Notes:  Patient is scheduled for ARTHROPLASTY, KNEE, TOTAL, USING IMAGELESS COMPUTER-ASSISTED NAVIGATION (Left: Knee) on 10/11/2024.    UA performed in PAT consistent with/concerning for infection.  No leukocytosis noted on CBC; WBC 4.9 Renal function: Estimated Creatinine Clearance: 64 mL/min (by C-G formula based on SCr of 0.57 mg/dL). Urine C&S added to assess for pathogenically significant growth.   Impression and Plan:  Stacy Warren with a UA that was (+) for infection; reflex culture sent. Preliminary culture (+) for significant GNR colony count; final pathogen ID and susceptibilities pending. Will plan on forwarding final culture results to MD as they become available to me. Sending results for review and consideration of preoperative treatment as deemed appropriate by  Dr. Hooten.   Dorise Pereyra, MSN, APRN, FNP-C, CEN [facility]  Perioperative Services Nurse Practitioner Phone: [phone] Fax: [phone] 10/02/24 5:37 PM  NOTE: This note has been prepared using Dragon dictation software. Despite my best ability to proofread, there is always the potential that unintentional transcriptional errors may still occur from this process.

## 2024-10-03 LAB — URINE CULTURE: Culture: >=100000 — AB

## 2024-10-06 NOTE — H&P (Signed)
 ORTHOPAEDIC HISTORY & PHYSICAL Stacy Warren, Stacy Warren., MD - 09/24/2024 10:45 AM EDT Formatting of this note is different from the original. Images from the original note were not included. Chief Complaint: Chief Complaint Patient presents with Knee Pain H & P LEFT KNEE  Reason for Visit: The patient is a 73 y.o. female who presents today for reevaluation of her left knee. She has a long history of bilateral knee pain with the left knee more symptomatic. She localizes most of the pain along the medial aspect of the knee. She reports some swelling, some locking, and significant giving way of the knee. The pain is aggravated by any weight bearing and rising after sitting. The knee pain limits the patient's ability to ambulate long distances. The patient has not appreciated any significant improvement despite Tylenol , NSAIDs, multiple intraarticular corticosteroid injections, tumeric, glucosamine/chondroitin, and activity modification. She is not using any ambulatory aids. She has deferred any surgical intervention in the past because she is caring for her husband who has dementia. The patient states that the knee pain has progressed to the point that it is significantly interfering with her activities of daily living.  Medications: Current Outpatient Medications Medication Sig Dispense Refill acetaminophen  (TYLENOL ) 650 MG ER tablet Take 650 mg by mouth every 8 (eight) hours as needed biotin 10,000 mcg Cap Take by mouth calcium carbonate-vitamin D3 (OS-CAL 500+D) 500 mg-5 mcg (200 unit) tablet Take 1 tablet by mouth 2 (two) times daily with meals cholecalciferol (VITAMIN D3) 1000 unit capsule Take 1,000 Units by mouth once daily co-enzyme Q-10, ubiquinone, 30 mg capsule Take by mouth cyanocobalamin (VITAMIN B12) 1000 MCG tablet Take 2,000 mcg by mouth once daily 2-3 times a week fexofenadine HCl (ALLEGRA ORAL) Take 1 tablet by mouth once daily glucosamine su 2KCl-chondroit 500-400 mg Tab Take  by mouth multivitamin tablet Take 1 tablet by mouth once daily. oxyquinoline-sod.lauryl sulfat (TRIMO-SAN JELLY) 0.025-0.01 % Gel Place 1 Applicatorful vaginally 3 (three) times a week 113.4 g 4 polyethylene glycol (MIRALAX) packet Take 17 g by mouth once daily as needed for Constipation Mix in 4-8ounces of fluid prior to taking. prasterone , dhea, (INTRAROSA ) 6.5 mg Inst Place 6.5 mg vaginally at bedtime 28 each 6 propylene glycoL (SYSTANE BALANCE) 0.6 % ophthalmic drops Place 1 drop into both eyes once daily as needed for Dry Eyes TURMERIC ORAL Take by mouth once daily  No current facility-administered medications for this visit.  Allergies: No Known Allergies  Past Medical History: Past Medical History: Diagnosis Date Arthritis Chickenpox Chronic kidney disease (CKD), stage II (mild) 04/09/2018 Last Assessment & Plan: Avoid NSAIDs; stay hydrated; explained diagnosis, nothing to worry about at this time Closed right hip fracture (CMS/HHS-HCC) 07/2006 History of pulmonary embolus (PE) 2007 following hip/femur surgery; taking ASA daily Malignant neoplasm of lower-inner quadrant of right breast of female, estrogen receptor positive (CMS/HHS-HCC) 12/22/2018 Osteoarthritis 09/24/2018 Last Assessment & Plan: Try Ts; weight loss for knees; let me know if any desire to PT for the knees Osteopenia 11/28/2018 Next DEXA or after Nov 28, 2020 Pulmonary embolism (CMS/HHS-HCC) 2007  Past Surgical History: Past Surgical History: Procedure Laterality Date BUNION CORRECTION 1969 CONIZATION CERVIX BY COLD KNIFE 1983 atypical cervical cells OTHER SURGICAL HISTORY 07/2006 Femur (plate) & hip (pinning) ; [facility] BREAST BIOPSY Right 12/13/2018 12/13/2018: Right breast stereotactic core needle biopsy of the mass in the LIQ (coil clip placed/displaced 4.5 cm laterally) - invasive ductal carcinoma, grade 2, ER+/PR+/HER2- MASTECTOMY PARTIAL / LUMPECTOMY Right 12/31/2018 12/31/2018: s/p needle  localized  right lumpectomy and SNB (Dr. Nicholaus) - several intraductal papillomas, no residual carcinoma, no biopsy site changed; 0/1 lymph nodes TONSILLECTOMY TUBAL LIGATION  Social History: Social History  Socioeconomic History Marital status: Married Spouse name: Stacy Warren Number of children: 2 Years of education: 13 Highest education level: Some college, no degree Occupational History Occupation: Retired Tobacco Use Smoking status: Never Smokeless tobacco: Never Vaping Use Vaping status: Never Used Substance and Sexual Activity Alcohol use: No Drug use: Never Sexual activity: Not Currently Partners: Male Social History Narrative Preferred pronouns: She/Her/Hers Closest relative: Stacy Warren, husband, [phone] Living Arrangements: Lives in [place], [place] with her husband Education: 13 years -- 1 year of business college Occupation: Retired Religious preference: Baptist Exercise: none at this time Diet: no special diet  Social Drivers of Catering manager Strain: Low Risk (09/24/2024) Overall Financial Resource Strain (CARDIA) Difficulty of Paying Living Expenses: Not hard at all Food Insecurity: No Food Insecurity (09/24/2024) Hunger Vital Sign Worried About Running Out of Food in the Last Year: Never true Ran Out of Food in the Last Year: Never true Transportation Needs: No Transportation Needs (09/24/2024) PRAPARE - Contractor (Medical): No Lack of Transportation (Non-Medical): No Physical Activity: Inactive (06/11/2024) Received from [facility] Exercise Vital Sign On average, how many days per week do you engage in moderate to strenuous exercise (like a brisk walk)?: 0 days On average, how many minutes do you engage in exercise at this level?: 0 min Stress: Stress Concern Present (06/11/2024) Received from [facility] - Occupational Stress Questionnaire Do you feel  stress - tense, restless, nervous, or anxious, or unable to sleep at night because your mind is troubled all the time - these days?: To some extent Social Connections: Moderately Integrated (06/11/2024) Received from [facility] Social Connection and Isolation Panel In a typical week, how many times do you talk on the phone with family, friends, or neighbors?: More than three times a week How often do you get together with friends or relatives?: More than three times a week How often do you attend church or religious services?: More than 4 times per year Do you belong to any clubs or organizations such as church groups, unions, fraternal or athletic groups, or school groups?: No How often do you attend meetings of the clubs or organizations you belong to?: Never Are you married, widowed, divorced, separated, never married, or living with a partner?: Married Housing Stability: Low Risk (09/24/2024) Housing Stability Vital Sign Unable to Pay for Housing in the Last Year: No Number of Times Moved in the Last Year: 0 Homeless in the Last Year: No  Family History: Family History Problem Relation Name Age of Onset Alzheimer's disease Mother Lymphoma Father 90 Alzheimer's disease Father No Known Problems Brother Breast cancer Maternal Aunt Cathlean Bloch dx in her 30s Breast cancer Paternal Aunt Mozelle McAdams dx 45/50 No Known Problems Son Thyroid disease Son Breast cancer Paternal Aunt Mozelle post-menopausal at dx Breast cancer Maternal Wylie Cathlean dx in her 30s  Review of Systems: A comprehensive 14 point ROS was performed, reviewed, and the pertinent orthopaedic findings are documented in the HPI.  Exam BP 130/80 (BP Location: Left upper arm, Patient Position: Sitting, BP Cuff Size: Large Adult)  Ht 157.5 cm (5' 2)  Wt 92.1 kg (203 lb)  BMI 37.13 kg/m  General: Well-developed, well-nourished anxious female seen in no acute distress. Antalgic gait. Varus thrust to both  knees.  HEENT: Atraumatic, normocephalic.  Pupils are equal and reactive to light. Extraocular motion is intact. Sclera are clear. Oropharynx is clear with moist mucosa.  Neck: Supple, nontender, and with good ROM. No thyromegaly, adenopathy, JVD, or carotid bruits.  Lungs: Clear to auscultation bilaterally.  Cardiovascular: Regular rate and rhythm. Normal S1, S2. 2/6 murmur. No appreciable gallops or rubs. Peripheral pulses are palpable. No lower extremity edema. Homan`s test is negative.  Abdomen: Soft, nontender, nondistended. Bowel sounds are present.  Extremities: Good strength, stability, and range of motion of the upper extremities. Good range of motion of the hips and ankles.  Left Knee: Soft tissue swelling: moderate Effusion: minimal Erythema: none Crepitance: moderate Tenderness: medial, lateral Alignment: relative varus Mediolateral laxity: medial pseudolaxity Posterior sag: negative Patellar tracking: Good tracking without evidence of subluxation or tilt Atrophy: No significant atrophy. Quadriceps tone was fair to good. Range of motion: 0/15/110 degrees  Neurologic: Awake, alert, and oriented. Sensory function is intact to pinprick and light touch. Motor strength is judged to be 5/5. Motor coordination is within normal limits. No apparent clonus. No tremor.  X-rays: I ordered and interpreted standing AP, lateral, and sunrise radiographs of the left knee that were obtained in the office today. There is significant narrowing of the medial cartilage space with bone-on-bone articulation and associated varus alignment. Prominent osteophyte formation is noted. Subchondral sclerosis is noted. Degenerative changes to the patellofemoral articulation are noted. No evidence of fracture or dislocation.  Impression: Degenerative arthrosis of the left knee  Plan: The findings were discussed in detail with the patient. Conservative treatment options were reviewed with  the patient. We discussed the risks and benefits of surgical intervention. The usual perioperative course was also discussed in detail. The patient expressed understanding of the risks and benefits of surgical intervention and would like to proceed with plans for left total knee arthroplasty. I recommended that she make plans for assistance with her husband for after her surgery.  The patient has a history of pulmonary embolism after her previous hip surgery. I recommended evaluation by Vascular Surgery for consideration of an IVC filter prior to the knee surgery. A referral was placed.  I spent a total of 45 minutes in both face-to-face and non-face-to-face activities, excluding procedures performed, for this visit on the date of this encounter.  MEDICAL CLEARANCE: Per anesthesiology. ACTIVITY: As tolerated. WORK STATUS: Not applicable. THERAPY: Preoperative physical therapy evaluation. MEDICATIONS: Requested Prescriptions  No prescriptions requested or ordered in this encounter  FOLLOW-UP: Return for postoperative follow-up.  Candie Gintz P. Siearra Amberg, Jr., M.D.  This note was generated in part with voice recognition software and I apologize for any typographical errors that were not detected and corrected.  Electronically signed by Mardee Stacy Idalia Mickey., MD at 09/29/2024 6:59 AM EDT

## 2024-10-07 ENCOUNTER — Encounter (INDEPENDENT_AMBULATORY_CARE_PROVIDER_SITE_OTHER): Payer: Self-pay | Admitting: Vascular Surgery

## 2024-10-07 ENCOUNTER — Ambulatory Visit (INDEPENDENT_AMBULATORY_CARE_PROVIDER_SITE_OTHER): Admitting: Vascular Surgery

## 2024-10-07 VITALS — BP 145/96 | HR 69 | Resp 18 | Ht 62.0 in | Wt 201.2 lb

## 2024-10-07 DIAGNOSIS — Z86711 Personal history of pulmonary embolism: Secondary | ICD-10-CM | POA: Diagnosis not present

## 2024-10-07 DIAGNOSIS — M1712 Unilateral primary osteoarthritis, left knee: Secondary | ICD-10-CM | POA: Diagnosis not present

## 2024-10-07 DIAGNOSIS — N182 Chronic kidney disease, stage 2 (mild): Secondary | ICD-10-CM

## 2024-10-08 ENCOUNTER — Ambulatory Visit: Admitting: Obstetrics and Gynecology

## 2024-10-08 ENCOUNTER — Telehealth (INDEPENDENT_AMBULATORY_CARE_PROVIDER_SITE_OTHER): Payer: Self-pay

## 2024-10-08 ENCOUNTER — Encounter: Payer: Self-pay | Admitting: Obstetrics and Gynecology

## 2024-10-08 VITALS — BP 122/67 | HR 72

## 2024-10-08 DIAGNOSIS — N952 Postmenopausal atrophic vaginitis: Secondary | ICD-10-CM

## 2024-10-08 DIAGNOSIS — N8110 Cystocele, unspecified: Secondary | ICD-10-CM

## 2024-10-08 DIAGNOSIS — N812 Incomplete uterovaginal prolapse: Secondary | ICD-10-CM | POA: Diagnosis not present

## 2024-10-08 NOTE — Telephone Encounter (Signed)
 Spoke with the patient and she is scheduled with Dr. Jama on 10/09/24 with a 12:15 pm arrival time to the [facility]. Pre-procedure instructions were discussed and will be sent to Mychart.

## 2024-10-08 NOTE — Patient Instructions (Addendum)
 Do the Intrarosa  nightly for the next 5 days.   This pessary should not flip as it is suctioned into position

## 2024-10-08 NOTE — Progress Notes (Unsigned)
 MRN : [id number]  Stacy Warren is a 73 y.o. (04/13/1951) female who presents with chief complaint of legs hurt and swell.  History of Present Illness: ***  Current Meds  Medication Sig   Biotin 89999 MCG TABS Take 1 tablet by mouth daily.   Calcium Carb-Cholecalciferol 600-20 MG-MCG CHEW Chew 1 tablet by mouth every other day.   Calcium-Magnesium-Vitamin D (CALCIUM 1200+D3 PO) Take 1 tablet by mouth every other day.   chlorpheniramine (CHLOR-TRIMETON) 4 MG tablet Take 4 mg by mouth in the morning.   Cholecalciferol (VITAMIN D3) 1000 units CAPS Take 1,000 Units by mouth daily.   Coenzyme Q10 200 MG TABS Take 200 mg by mouth daily.   Cyanocobalamin (VITAMIN B-12) 5000 MCG TBDP Take 5,000 mcg by mouth daily in the afternoon.   fluticasone (FLONASE) 50 MCG/ACT nasal spray Place 1 spray into both nostrils daily as needed for allergies or rhinitis.   INTRAROSA  6.5 MG INST PLACE 6.5 MG VAGINALLY AT BEDTIME. (Patient taking differently: Place 6.5 mg vaginally 2 (two) times a week.)   Misc Natural Products (GLUCOSAMINE CHOND MSM FORMULA) TABS Take 1 tablet by mouth daily.   Multiple Vitamins-Minerals (VITAFUSION MULTI WOMENS) CHEW Chew 1 tablet by mouth daily.   Polyethyl Glycol-Propyl Glycol (SYSTANE OP) Place 1 drop into both eyes daily as needed (dry eyes).   Turmeric 500 MG TABS Take 1,000 mg by mouth daily.    Past Medical History:  Diagnosis Date   Aromatase inhibitor use 06/11/2020   Arthritis    Breast cancer (HCC)    Cancer (HCC)    Glaucoma    Motion sickness    back  seat - cars   Osteopenia 11/28/2018   Pulmonary embolism (HCC) 04/09/2018   Completed course of blood thinner in 2007; after femur fracture and surgery    Past Surgical History:  Procedure Laterality Date   BIOPSY  01/23/2024   Procedure: BIOPSY;  Surgeon: Unk Corinn Skiff, MD;  Location: [facility];  Service: Endoscopy;;   BREAST BIOPSY Right 12/13/2018   stereo bx of mass, coil  clip INVASIVE MAMMARY CARCINOMA   BREAST LUMPECTOMY Right 12/31/2018   [facility]   BREAST LUMPECTOMY WITH NEEDLE LOCALIZATION AND AXILLARY SENTINEL LYMPH NODE BX Right 12/31/2018   Procedure: BREAST LUMPECTOMY WITH SENTINEL LYMPH NODE BX, WIRE LOCALIZATION;  Surgeon: Nicholaus Selinda Birmingham, MD;  Location: [facility];  Service: General;  Laterality: Right;   bunionectomy Left    CATARACT EXTRACTION W/PHACO Left 10/12/2022   Procedure: CATARACT EXTRACTION PHACO AND INTRAOCULAR LENS PLACEMENT (IOC) LEFT 11.13 01:13.9;  Surgeon: Mittie Gaskin, MD;  Location: [facility];  Service: Ophthalmology;  Laterality: Left;   CATARACT EXTRACTION W/PHACO Right 10/26/2022   Procedure: CATARACT EXTRACTION PHACO AND INTRAOCULAR LENS PLACEMENT (IOC) RIGHT 12.71 01:14.8;  Surgeon: Mittie Gaskin, MD;  Location: [facility];  Service: Ophthalmology;  Laterality: Right;   CERVICAL CONE BIOPSY  1984   COLONOSCOPY WITH PROPOFOL  N/A 01/23/2024   Procedure: COLONOSCOPY WITH PROPOFOL ;  Surgeon: Unk Corinn Skiff, MD;  Location: [facility];  Service: Endoscopy;  Laterality: N/A;  COLON SCOPE REMOVED @ 0836 AND CHANGED OVER TO A PEDS SCOPE IN @ [phone] CECUM REACHED @ 0846   DILATION AND CURETTAGE OF UTERUS  06/07/2018   EYE SURGERY     HIP PINNING  2007   HYSTEROSCOPY WITH D & C N/A 06/07/2018   Procedure: DILATATION AND CURETTAGE /HYSTEROSCOPY/MYOSURE POLYPECTOMY;  Surgeon: Lake Read, MD;  Location: [facility];  Service: Gynecology;  Laterality: N/A;   LEG SURGERY  2007   plate   TONSILLECTOMY     VAGINAL DELIVERY     x2    Social History Social History   Tobacco Use   Smoking status: Never   Smokeless tobacco: Never  Vaping Use   Vaping status: Never Used  Substance Use Topics   Alcohol use: No   Drug use: No    Family History Family History  Problem Relation Age of Onset   Alzheimer's disease Mother        deceased 15   Lymphoma Father 69       deceased 1   Heart  disease Father    Skin cancer Brother        currently 72   Thyroid disease Son    Hypertension Son    Breast cancer Maternal Aunt 35       deceased 30s   Breast cancer Maternal Aunt 35       deceased 30s   Breast cancer Paternal Aunt        dx 19s; deceased 63s   Colon cancer Neg Hx    Bladder Cancer Neg Hx    Uterine cancer Neg Hx    Renal cancer Neg Hx     No Known Allergies   REVIEW OF SYSTEMS (Negative unless checked)  Constitutional: [] Weight loss  [] Fever  [] Chills Cardiac: [] Chest pain   [] Chest pressure   [] Palpitations   [] Shortness of breath when laying flat   [] Shortness of breath with exertion. Vascular:  [] Pain in legs with walking   [x] Pain in legs at rest  [] History of DVT   [] Phlebitis   [x] Swelling in legs   [] Varicose veins   [] Non-healing ulcers Pulmonary:   [] Uses home oxygen   [] Productive cough   [] Hemoptysis   [] Wheeze  [] COPD   [] Asthma Neurologic:  [] Dizziness   [] Seizures   [] History of stroke   [] History of TIA  [] Aphasia   [] Vissual changes   [] Weakness or numbness in arm   [] Weakness or numbness in leg Musculoskeletal:   [] Joint swelling   [] Joint pain   [] Low back pain Hematologic:  [] Easy bruising  [] Easy bleeding   [] Hypercoagulable state   [] Anemic Gastrointestinal:  [] Diarrhea   [] Vomiting  [] Gastroesophageal reflux/heartburn   [] Difficulty swallowing. Genitourinary:  [] Chronic kidney disease   [] Difficult urination  [] Frequent urination   [] Blood in urine Skin:  [] Rashes   [] Ulcers  Psychological:  [] History of anxiety   []  History of major depression.  Physical Examination  Vitals:   10/07/24 1430  BP: (!) 145/96  Pulse: 69  Resp: 18  Weight: 201 lb 3.2 oz (91.3 kg)  Height: 5' 2 (1.575 m)   Body mass index is 36.8 kg/m. Gen: WD/WN, NAD Head: [place]/AT, No temporalis wasting.  Ear/Nose/Throat: Hearing grossly intact, nares w/o erythema or drainage, pinna without lesions Eyes: PER, EOMI, sclera nonicteric.  Neck: Supple, no gross  masses.  No JVD.  Pulmonary:  Good air movement, no audible wheezing, no use of accessory muscles.  Cardiac: RRR, precordium not hyperdynamic. Vascular:  scattered varicosities present bilaterally.  Moderate venous stasis changes to the legs bilaterally.  2+ soft pitting edema. CEAP [id number]   Vessel Right Left  Radial Palpable Palpable  Gastrointestinal: soft, non-distended. No guarding/no peritoneal signs.  Musculoskeletal: M/S 5/5 throughout.  No deformity.  Neurologic: CN 2-12 intact. Pain and light touch intact in extremities.  Symmetrical.  Speech is fluent.  Motor exam as listed above. Psychiatric: Judgment intact, Mood & affect appropriate for pt's clinical situation. Dermatologic: Venous rashes no ulcers noted.  No changes consistent with cellulitis. Lymph : No lichenification or skin changes of chronic lymphedema.  CBC Lab Results  Component Value Date   WBC 4.9 10/01/2024   HGB 13.7 10/01/2024   HCT 41.0 10/01/2024   MCV 86.1 10/01/2024   PLT 214 10/01/2024    BMET    Component Value Date/Time   NA 137 10/01/2024 1012   NA 141 11/21/2023 1428   K 3.8 10/01/2024 1012   CL 101 10/01/2024 1012   CO2 26 10/01/2024 1012   GLUCOSE 100 (H) 10/01/2024 1012   BUN 16 10/01/2024 1012   BUN 20 11/21/2023 1428   CREATININE 0.57 10/01/2024 1012   CREATININE 0.79 04/09/2018 1048   CALCIUM 9.5 10/01/2024 1012   GFRNONAA >60 10/01/2024 1012   GFRNONAA 77 04/09/2018 1048   GFRAA >60 12/21/2018 1223   GFRAA 89 04/09/2018 1048   Estimated Creatinine Clearance: 63.9 mL/min (by C-G formula based on SCr of 0.57 mg/dL).  COAG No results found for: INR, PROTIME  Radiology No results found.   Assessment/Plan There are no diagnoses linked to this encounter.   [name], MD  10/08/2024 8:19 AM

## 2024-10-08 NOTE — H&P (View-Only) (Signed)
 MRN : [id number]  Stacy Warren is a 73 y.o. (02/07/1951) female who presents with chief complaint of legs hurt and swell.  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  DVT was identified years ago and was treated with anticoagulation.  The presenting symptoms were pain and swelling in the lower extremity.  The patient notes the leg continues to be mildly painful with dependency and still swells with long periods of dependency.  Symptoms are much better with elevation.  The patient notes minimal edema in the morning which increases to some degree throughout the day.   She has a history of PE following knee surgery in 2007  The patient has not been using compression therapy on a daily basis.  No SOB or pleuritic chest pains.  No cough or hemoptysis.  The patient is currently not on anticoagulation. No blood per rectum or blood in any sputum.  No excessive bruising per the patient.    No recent shortening of the patient's walking distance or new symptoms consistent with claudication.  No history of rest pain symptoms. No new ulcers or wounds of the lower extremities have occurred.  The patient denies amaurosis fugax or recent TIA symptoms. There are no recent neurological changes noted. No recent episodes of angina or shortness of breath documented.   Current Meds  Medication Sig   Biotin 89999 MCG TABS Take 1 tablet by mouth daily.   Calcium Carb-Cholecalciferol 600-20 MG-MCG CHEW Chew 1 tablet by mouth every other day.   Calcium-Magnesium-Vitamin D (CALCIUM 1200+D3 PO) Take 1 tablet by mouth every other day.   chlorpheniramine (CHLOR-TRIMETON) 4 MG tablet Take 4 mg by mouth in the morning.   Cholecalciferol (VITAMIN D3) 1000 units CAPS Take 1,000 Units by mouth daily.   Coenzyme Q10 200 MG TABS Take 200 mg by mouth daily.   Cyanocobalamin (VITAMIN B-12) 5000 MCG  TBDP Take 5,000 mcg by mouth daily in the afternoon.   fluticasone (FLONASE) 50 MCG/ACT nasal spray Place 1 spray into both nostrils daily as needed for allergies or rhinitis.   INTRAROSA  6.5 MG INST PLACE 6.5 MG VAGINALLY AT BEDTIME. (Patient taking differently: Place 6.5 mg vaginally 2 (two) times a week.)   Misc Natural Products (GLUCOSAMINE CHOND MSM FORMULA) TABS Take 1 tablet by mouth daily.   Multiple Vitamins-Minerals (VITAFUSION MULTI WOMENS) CHEW Chew 1 tablet by mouth daily.   Polyethyl Glycol-Propyl Glycol (SYSTANE OP) Place 1 drop into both eyes daily as needed (dry eyes).   Turmeric 500 MG TABS Take 1,000 mg by mouth daily.    Past Medical History:  Diagnosis Date   Aromatase inhibitor use 06/11/2020   Arthritis    Breast cancer (HCC)    Cancer (HCC)    Glaucoma    Motion sickness    back  seat - cars   Osteopenia 11/28/2018   Pulmonary embolism (HCC) 04/09/2018   Completed course of blood thinner in 2007; after femur fracture and surgery    Past Surgical History:  Procedure Laterality Date   BIOPSY  01/23/2024   Procedure: BIOPSY;  Surgeon: Unk Corinn Skiff, MD;  Location: [facility];  Service: Endoscopy;;   BREAST BIOPSY Right 12/13/2018   stereo bx of mass, coil clip INVASIVE MAMMARY CARCINOMA   BREAST LUMPECTOMY Right 12/31/2018   [facility]   BREAST LUMPECTOMY WITH NEEDLE LOCALIZATION AND AXILLARY  SENTINEL LYMPH NODE BX Right 12/31/2018   Procedure: BREAST LUMPECTOMY WITH SENTINEL LYMPH NODE BX, WIRE LOCALIZATION;  Surgeon: Nicholaus Selinda Birmingham, MD;  Location: [facility] ORS;  Service: General;  Laterality: Right;   bunionectomy Left    CATARACT EXTRACTION W/PHACO Left 10/12/2022   Procedure: CATARACT EXTRACTION PHACO AND INTRAOCULAR LENS PLACEMENT (IOC) LEFT 11.13 01:13.9;  Surgeon: Mittie Gaskin, MD;  Location: [facility];  Service: Ophthalmology;  Laterality: Left;   CATARACT EXTRACTION W/PHACO Right 10/26/2022   Procedure: CATARACT EXTRACTION  PHACO AND INTRAOCULAR LENS PLACEMENT (IOC) RIGHT 12.71 01:14.8;  Surgeon: Mittie Gaskin, MD;  Location: [facility];  Service: Ophthalmology;  Laterality: Right;   CERVICAL CONE BIOPSY  1984   COLONOSCOPY WITH PROPOFOL  N/A 01/23/2024   Procedure: COLONOSCOPY WITH PROPOFOL ;  Surgeon: Unk Corinn Skiff, MD;  Location: [facility];  Service: Endoscopy;  Laterality: N/A;  COLON SCOPE REMOVED @ 0836 AND CHANGED OVER TO A PEDS SCOPE IN @ [phone] CECUM REACHED @ 0846   DILATION AND CURETTAGE OF UTERUS  06/07/2018   EYE SURGERY     HIP PINNING  2007   HYSTEROSCOPY WITH D & C N/A 06/07/2018   Procedure: DILATATION AND CURETTAGE /HYSTEROSCOPY/MYOSURE POLYPECTOMY;  Surgeon: Lake Read, MD;  Location: [facility];  Service: Gynecology;  Laterality: N/A;   LEG SURGERY  2007   plate   TONSILLECTOMY     VAGINAL DELIVERY     x2    Social History Social History   Tobacco Use   Smoking status: Never   Smokeless tobacco: Never  Vaping Use   Vaping status: Never Used  Substance Use Topics   Alcohol use: No   Drug use: No    Family History Family History  Problem Relation Age of Onset   Alzheimer's disease Mother        deceased 33   Lymphoma Father 89       deceased 54   Heart disease Father    Skin cancer Brother        currently 30   Thyroid disease Son    Hypertension Son    Breast cancer Maternal Aunt 35       deceased 30s   Breast cancer Maternal Aunt 35       deceased 30s   Breast cancer Paternal Aunt        dx 46s; deceased 46s   Colon cancer Neg Hx    Bladder Cancer Neg Hx    Uterine cancer Neg Hx    Renal cancer Neg Hx     No Known Allergies   REVIEW OF SYSTEMS (Negative unless checked)  Constitutional: [] Weight loss  [] Fever  [] Chills Cardiac: [] Chest pain   [] Chest pressure   [] Palpitations   [] Shortness of breath when laying flat   [] Shortness of breath with exertion. Vascular:  [] Pain in legs with walking   [x] Pain in legs at rest   [] History of DVT   [] Phlebitis   [x] Swelling in legs   [] Varicose veins   [] Non-healing ulcers Pulmonary:   [] Uses home oxygen   [] Productive cough   [] Hemoptysis   [] Wheeze  [] COPD   [] Asthma Neurologic:  [] Dizziness   [] Seizures   [] History of stroke   [] History of TIA  [] Aphasia   [] Vissual changes   [] Weakness or numbness in arm   [] Weakness or numbness in leg Musculoskeletal:   [] Joint swelling   [] Joint pain   [] Low back pain Hematologic:  [] Easy bruising  [] Easy bleeding   [] Hypercoagulable  state   [] Anemic Gastrointestinal:  [] Diarrhea   [] Vomiting  [] Gastroesophageal reflux/heartburn   [] Difficulty swallowing. Genitourinary:  [] Chronic kidney disease   [] Difficult urination  [] Frequent urination   [] Blood in urine Skin:  [] Rashes   [] Ulcers  Psychological:  [] History of anxiety   []  History of major depression.  Physical Examination  Vitals:   10/07/24 1430  BP: (!) 145/96  Pulse: 69  Resp: 18  Weight: 201 lb 3.2 oz (91.3 kg)  Height: 5' 2 (1.575 m)   Body mass index is 36.8 kg/m. Gen: WD/WN, NAD Head: [place]/AT, No temporalis wasting.  Ear/Nose/Throat: Hearing grossly intact, nares w/o erythema or drainage, pinna without lesions Eyes: PER, EOMI, sclera nonicteric.  Neck: Supple, no gross masses.  No JVD.  Pulmonary:  Good air movement, no audible wheezing, no use of accessory muscles.  Cardiac: RRR, precordium not hyperdynamic. Vascular:  scattered varicosities present bilaterally.  Moderate venous stasis changes to the legs bilaterally.  2+ soft pitting edema. CEAP [id number]   Vessel Right Left  Radial Palpable Palpable  Gastrointestinal: soft, non-distended. No guarding/no peritoneal signs.  Musculoskeletal: M/S 5/5 throughout.  No deformity.  Neurologic: CN 2-12 intact. Pain and light touch intact in extremities.  Symmetrical.  Speech is fluent. Motor exam as listed above. Psychiatric: Judgment intact, Mood & affect appropriate for pt's clinical situation. Dermatologic:  Venous rashes no ulcers noted.  No changes consistent with cellulitis. Lymph : No lichenification or skin changes of chronic lymphedema.  CBC Lab Results  Component Value Date   WBC 4.9 10/01/2024   HGB 13.7 10/01/2024   HCT 41.0 10/01/2024   MCV 86.1 10/01/2024   PLT 214 10/01/2024    BMET    Component Value Date/Time   NA 137 10/01/2024 1012   NA 141 11/21/2023 1428   K 3.8 10/01/2024 1012   CL 101 10/01/2024 1012   CO2 26 10/01/2024 1012   GLUCOSE 100 (H) 10/01/2024 1012   BUN 16 10/01/2024 1012   BUN 20 11/21/2023 1428   CREATININE 0.57 10/01/2024 1012   CREATININE 0.79 04/09/2018 1048   CALCIUM 9.5 10/01/2024 1012   GFRNONAA >60 10/01/2024 1012   GFRNONAA 77 04/09/2018 1048   GFRAA >60 12/21/2018 1223   GFRAA 89 04/09/2018 1048   Estimated Creatinine Clearance: 63.9 mL/min (by C-G formula based on SCr of 0.57 mg/dL).  COAG No results found for: INR, PROTIME  Radiology No results found.   Assessment/Plan 1. Primary osteoarthritis of left knee (Primary) Continue medications to treat the patient's degenerative disease as already ordered, these medications have been reviewed and there are no changes at this time.  Continued activity and therapy was stressed.  2. History of pulmonary embolism  Recommend:  The patient will continue anticoagulation for now as there have not been any problems or complications secondary to this therapy at this point.  IVC filter insertion is indicated prior to high risk orthopedic surgery.  This is especially true given the history of PE associated with the past DVT.  IVC filter placement will be done the week for surgery. Risk and benefits were reviewed with the patient.  Indications for the procedure were discussed.  All questions were answered, the patient agrees to proceed with IVC filter insertion.   I have reviewed with the patient DVT and post phlebitic changes such as swelling and why it  causes symptoms such as pain.  I  recommended to the patient to wear graduated compression stockings, beginning after three full days of anticoagulation.  Graduated compression should be worn on a daily basis. The patient should wear compression beginning first thing in the morning and removing them in the evening. The patient is instructed specifically not to sleep in the stockings.  In addition, behavioral modification including elevation during the day and avoidance of prolonged dependency will be initiated.    The patient will follow-up with me in 3 months after the joint replacement surgery to discuss removal (this was also discussed today and the patient agrees with the plan to have the filter removed).      3. Chronic kidney disease (CKD), stage II (mild) The patient has advanced renal disease.  However, at the present time the patient is not yet on dialysis.  Avoid nephrotoxic medications and dehydration.  Further plans per nephrology    [name], MD  10/08/2024 8:19 AM

## 2024-10-08 NOTE — Progress Notes (Signed)
 [facility]   Subjective:     Chief Complaint:  Chief Complaint  Patient presents with   Pessary issue    Stacy Warren is a 73 y.o. female is here for pessary issue.   History of Present Illness: Stacy Warren is a 73 y.o. female with stage II pelvic organ prolapse who presents for a pessary check. She is using a size #7 Shatz pessary. The pessary has felt like it is in an odd position and the patient feels she is having a significant amount of leakage and discomfort. She is using vaginal estrogen. She denies vaginal bleeding.  Past Medical History: Patient  has a past medical history of Aromatase inhibitor use (06/11/2020), Arthritis, Breast cancer (HCC), Cancer (HCC), Glaucoma, Motion sickness, Osteopenia (11/28/2018), and Pulmonary embolism (HCC) (04/09/2018).   Past Surgical History: She  has a past surgical history that includes Hip pinning (2007); Leg Surgery (2007); Tonsillectomy; bunionectomy (Left); Vaginal delivery; Cervical cone biopsy (8015); Hysteroscopy with D & C (N/A, 06/07/2018); Dilation and curettage of uterus (06/07/2018); Breast lumpectomy with needle localization and axillary sentinel lymph node bx (Right, 12/31/2018); Breast biopsy (Right, 12/13/2018); Breast lumpectomy (Right, 12/31/2018); Cataract extraction w/PHACO (Left, 10/12/2022); Cataract extraction w/PHACO (Right, 10/26/2022); Colonoscopy with propofol  (N/A, 01/23/2024); biopsy (01/23/2024); and Eye surgery.   Medications: She has a current medication list which includes the following prescription(s): biotin, calcium carb-cholecalciferol, calcium-magnesium-vitamin d, chlorpheniramine, vitamin d3, coenzyme q10, vitamin b-12, fluticasone, ibuprofen , intrarosa , glucosamine chond msm formula, vitafusion multi womens, polyethyl glycol-propyl glycol, sulfamethoxazole -trimethoprim , and turmeric.   Allergies: Patient has no known allergies.   Social History: Patient  reports that she has never  smoked. She has never used smokeless tobacco. She reports that she does not drink alcohol and does not use drugs.      Objective:    Physical Exam: BP 122/67   Pulse 72  Gen: No apparent distress, A&O x 3. Detailed Urogynecologic Evaluation:  Pelvic Exam: Normal external female genitalia; Bartholin's and Skene's glands normal in appearance; urethral meatus normal in appearance, no urethral masses or discharge. The pessary was noted to be flipped. It was removed and cleaned. Speculum exam revealed erythema in the vagina. The pessary was replaced with a #7 Long Stem Gellhorn. It was comfortable to the patient and fit well.      Assessment/Plan:    Assessment: [name] is a 73 y.o. with stage II pelvic organ prolapse here for a pessary check. She is doing well.  Plan: She will keep the pessary in place until next visit. She will continue to use estrogen. She will follow-up in 3 months for a pessary check or sooner as needed.  All questions were answered.

## 2024-10-09 ENCOUNTER — Ambulatory Visit
Admission: RE | Admit: 2024-10-09 | Discharge: 2024-10-09 | Disposition: A | Discharge status: Home / Self Care | Attending: Vascular Surgery | Admitting: Vascular Surgery

## 2024-10-09 ENCOUNTER — Encounter: Admission: RE | Disposition: A | Payer: Self-pay | Source: Home / Self Care | Attending: Vascular Surgery

## 2024-10-09 ENCOUNTER — Encounter (INDEPENDENT_AMBULATORY_CARE_PROVIDER_SITE_OTHER): Payer: Self-pay | Admitting: Vascular Surgery

## 2024-10-09 ENCOUNTER — Other Ambulatory Visit: Payer: Self-pay

## 2024-10-09 ENCOUNTER — Encounter: Payer: Self-pay | Admitting: Vascular Surgery

## 2024-10-09 DIAGNOSIS — M1712 Unilateral primary osteoarthritis, left knee: Secondary | ICD-10-CM | POA: Insufficient documentation

## 2024-10-09 DIAGNOSIS — Z4089 Encounter for other prophylactic surgery: Secondary | ICD-10-CM | POA: Diagnosis not present

## 2024-10-09 DIAGNOSIS — Z86711 Personal history of pulmonary embolism: Secondary | ICD-10-CM

## 2024-10-09 DIAGNOSIS — N182 Chronic kidney disease, stage 2 (mild): Secondary | ICD-10-CM | POA: Diagnosis not present

## 2024-10-09 DIAGNOSIS — Z409 Encounter for prophylactic surgery, unspecified: Secondary | ICD-10-CM | POA: Diagnosis not present

## 2024-10-09 DIAGNOSIS — I82409 Acute embolism and thrombosis of unspecified deep veins of unspecified lower extremity: Secondary | ICD-10-CM

## 2024-10-09 DIAGNOSIS — Z86718 Personal history of other venous thrombosis and embolism: Secondary | ICD-10-CM

## 2024-10-09 DIAGNOSIS — Z01818 Encounter for other preprocedural examination: Secondary | ICD-10-CM

## 2024-10-09 HISTORY — PX: IVC FILTER INSERTION: CATH118245

## 2024-10-09 LAB — CREATININE, SERUM
Creatinine, Ser: 0.96 mg/dL (ref 0.44–1.00)
GFR, Estimated: >60 mL/min (ref >60)

## 2024-10-09 LAB — BUN: BUN: 16 mg/dL (ref 8–23)

## 2024-10-09 SURGERY — IVC FILTER INSERTION
Anesthesia: Moderate Sedation

## 2024-10-09 MED ORDER — CEFAZOLIN SODIUM-DEXTROSE 2-4 GM/100ML-% IV SOLN
INTRAVENOUS | Status: AC
  Filled 2024-10-09: qty 100

## 2024-10-09 MED ORDER — FENTANYL CITRATE (PF) 50 MCG/ML IJ SOSY
PREFILLED_SYRINGE | INTRAMUSCULAR | Status: AC
  Filled 2024-10-09: qty 1

## 2024-10-09 MED ORDER — SODIUM CHLORIDE 0.9 % IV SOLN: INTRAVENOUS | Status: DC

## 2024-10-09 MED ORDER — METHYLPREDNISOLONE SODIUM SUCC 125 MG IJ SOLR: 125.0000 mg | Freq: Once | INTRAMUSCULAR | Status: DC | PRN

## 2024-10-09 MED ORDER — FENTANYL CITRATE (PF) 100 MCG/2ML IJ SOLN
INTRAMUSCULAR | Status: DC | PRN
  Administered 2024-10-09: 50 ug via INTRAVENOUS

## 2024-10-09 MED ORDER — HEPARIN (PORCINE) IN NACL 1000-0.9 UT/500ML-% IV SOLN
INTRAVENOUS | Status: DC | PRN
  Administered 2024-10-09: 500 mL

## 2024-10-09 MED ORDER — CEFAZOLIN SODIUM-DEXTROSE 2-4 GM/100ML-% IV SOLN
2.0000 g | INTRAVENOUS | Status: AC
  Administered 2024-10-09: 2 g via INTRAVENOUS

## 2024-10-09 MED ORDER — MIDAZOLAM HCL 2 MG/2ML IJ SOLN
INTRAMUSCULAR | Status: DC | PRN
  Administered 2024-10-09: 2 mg via INTRAVENOUS

## 2024-10-09 MED ORDER — ONDANSETRON HCL 4 MG/2ML IJ SOLN: 4.0000 mg | Freq: Four times a day (QID) | INTRAMUSCULAR | Status: DC | PRN

## 2024-10-09 MED ORDER — HEPARIN SODIUM (PORCINE) 1000 UNIT/ML IJ SOLN
INTRAMUSCULAR | Status: AC
  Filled 2024-10-09: qty 10

## 2024-10-09 MED ORDER — IODIXANOL 320 MG/ML IV SOLN
INTRAVENOUS | Status: DC | PRN
  Administered 2024-10-09: 10 mL

## 2024-10-09 MED ORDER — LIDOCAINE HCL (PF) 1 % IJ SOLN
INTRAMUSCULAR | Status: DC | PRN
  Administered 2024-10-09: 10 mL

## 2024-10-09 MED ORDER — HYDROMORPHONE HCL 1 MG/ML IJ SOLN: 1.0000 mg | Freq: Once | INTRAMUSCULAR | Status: DC | PRN

## 2024-10-09 MED ORDER — DIPHENHYDRAMINE HCL 50 MG/ML IJ SOLN: 50.0000 mg | Freq: Once | INTRAMUSCULAR | Status: DC | PRN

## 2024-10-09 MED ORDER — MIDAZOLAM HCL 2 MG/2ML IJ SOLN
INTRAMUSCULAR | Status: AC
  Filled 2024-10-09: qty 2

## 2024-10-09 MED ORDER — FAMOTIDINE 20 MG PO TABS: 40.0000 mg | ORAL_TABLET | Freq: Once | ORAL | Status: DC | PRN

## 2024-10-09 MED ORDER — MIDAZOLAM HCL 2 MG/ML PO SYRP: 8.0000 mg | ORAL_SOLUTION | Freq: Once | ORAL | Status: DC | PRN

## 2024-10-09 SURGICAL SUPPLY — 7 items
COVER PROBE ULTRASOUND 5X96 (MISCELLANEOUS) IMPLANT
KIT FEMORAL DEL DENALI (Miscellaneous) IMPLANT
NDL ENTRY 21GA 7CM ECHOTIP (NEEDLE) IMPLANT
NEEDLE ENTRY 21GA 7CM ECHOTIP (NEEDLE) ×1 IMPLANT
PACK ANGIOGRAPHY (CUSTOM PROCEDURE TRAY) ×1 IMPLANT
SET INTRO CAPELLA COAXIAL (SET/KITS/TRAYS/PACK) IMPLANT
WIRE SUPRACORE 190CM (WIRE) IMPLANT

## 2024-10-09 NOTE — Interval H&P Note (Signed)
 History and Physical Interval Note:  10/09/2024 1:33 PM  Stacy Warren  has presented today for surgery, with the diagnosis of IVC Filter Placement   DVT.  The various methods of treatment have been discussed with the patient and family. After consideration of risks, benefits and other options for treatment, the patient has consented to  Procedure(s): IVC FILTER INSERTION (N/A) as a surgical intervention.  The patient's history has been reviewed, patient examined, no change in status, stable for surgery.  I have reviewed the patient's chart and labs.  Questions were answered to the patient's satisfaction.     [name]

## 2024-10-09 NOTE — Progress Notes (Signed)
 Dr. Jama saw pt. Now to discuss IVC filter with pt. Pt. To follow up in 1 month at office. Pt. Verbalized understanding of conversation with MD and DC instructions.

## 2024-10-09 NOTE — Op Note (Signed)
 [facility]   OPERATIVE NOTE    PRE-OPERATIVE DIAGNOSIS: DVT with PE  POST-OPERATIVE DIAGNOSIS: Same  PROCEDURE: 1.   Ultrasound guidance for vascular access to the right common femoral vein 2.   Catheter placement into the inferior vena cava 3.   Inferior venacavogram 4.   Placement of a Denali IVC filter  SURGEON: [name]  ASSISTANT(S): None  ANESTHESIA: Conscious sedation was administered by the interventional radiology RN under my direct supervision. IV Versed  plus fentanyl  were utilized. Continuous ECG, pulse oximetry and blood pressure was monitored throughout the entire procedure. Conscious sedation was for a total of 20 minutes and 7 seconds.  ESTIMATED BLOOD LOSS: minimal  FINDING(S): 1.  Patent IVC  SPECIMEN(S):  none  INDICATIONS:   Stacy Warren is a 73 y.o. y.o. female who presents with upcoming joint replacement surgery.  In 2007 she sustained a PE shortly after knee surgery.  Inferior vena cava filter is indicated for this reason.  Risks and benefits including filter thrombosis, migration, fracture, bleeding, and infection were all discussed.  We discussed that all IVC filters that we place can be removed if desired from the patient once the need for the filter has passed.    DESCRIPTION: After obtaining full informed written consent, the patient was brought back to the vascular suite. The skin was sterilely prepped and draped in a sterile surgical field was created. Ultrasound was placed in a sterile sleeve. The right common femoral vein was echolucent and compressible indicating patency. Image was recorded for the permanent record. The puncture was made under continuous real-time ultrasound guidance.  The right common femoral vein was accessed under direct ultrasound guidance without difficulty with a micropuncture needle. Microwire was then advanced under fluoroscopic guidance without difficulty. Micro-sheath was then inserted and  a J-wire was then placed. The dilator is passed over the wire and the delivery sheath was placed into the inferior vena cava.  Inferior venacavogram was performed. This demonstrated a patent IVC with the level of the renal veins at L1-L2.  The filter was then deployed into the inferior vena cava at the level of L3 just below the renal veins. The delivery sheath was then removed. Pressure was held. Sterile dressings were placed. The patient tolerated the procedure well and was taken to the recovery room in stable condition.  Interpretation: IVC is widely patent.  Is measured and is 27 mm in diameter.  IVC filter is deployed in good orientation at the L3 level.  COMPLICATIONS: None  CONDITION: Stable  [name]  10/09/2024, 2:04 PM

## 2024-10-10 ENCOUNTER — Encounter: Payer: Self-pay | Admitting: Orthopedic Surgery

## 2024-10-10 ENCOUNTER — Telehealth (INDEPENDENT_AMBULATORY_CARE_PROVIDER_SITE_OTHER): Payer: Self-pay | Admitting: Vascular Surgery

## 2024-10-10 NOTE — Telephone Encounter (Addendum)
 Pt returned call for [facility] pt is scheduled for 11/13/24 for post-op with Jama, Cordella. MD.

## 2024-10-11 ENCOUNTER — Observation Stay

## 2024-10-11 ENCOUNTER — Encounter: Admission: RE | Disposition: A | Payer: Self-pay | Source: Home / Self Care | Attending: Orthopedic Surgery

## 2024-10-11 ENCOUNTER — Encounter: Payer: Self-pay | Admitting: Orthopedic Surgery

## 2024-10-11 ENCOUNTER — Other Ambulatory Visit: Payer: Self-pay

## 2024-10-11 ENCOUNTER — Ambulatory Visit: Payer: Self-pay | Admitting: Urgent Care

## 2024-10-11 ENCOUNTER — Observation Stay
Admission: RE | Admit: 2024-10-11 | Discharge: 2024-10-13 | Disposition: A | Discharge status: Home / Self Care | Attending: Orthopedic Surgery | Admitting: Orthopedic Surgery

## 2024-10-11 DIAGNOSIS — Z96652 Presence of left artificial knee joint: Secondary | ICD-10-CM | POA: Diagnosis not present

## 2024-10-11 DIAGNOSIS — Z7982 Long term (current) use of aspirin: Secondary | ICD-10-CM | POA: Insufficient documentation

## 2024-10-11 DIAGNOSIS — Y849 Medical procedure, unspecified as the cause of abnormal reaction of the patient, or of later complication, without mention of misadventure at the time of the procedure: Secondary | ICD-10-CM | POA: Diagnosis not present

## 2024-10-11 DIAGNOSIS — N950 Postmenopausal bleeding: Secondary | ICD-10-CM

## 2024-10-11 DIAGNOSIS — R8290 Unspecified abnormal findings in urine: Secondary | ICD-10-CM

## 2024-10-11 DIAGNOSIS — I9752 Accidental puncture and laceration of a circulatory system organ or structure during other procedure: Secondary | ICD-10-CM | POA: Diagnosis not present

## 2024-10-11 DIAGNOSIS — R8271 Bacteriuria: Secondary | ICD-10-CM

## 2024-10-11 DIAGNOSIS — M1712 Unilateral primary osteoarthritis, left knee: Secondary | ICD-10-CM | POA: Diagnosis not present

## 2024-10-11 DIAGNOSIS — M25562 Pain in left knee: Secondary | ICD-10-CM | POA: Diagnosis present

## 2024-10-11 DIAGNOSIS — Z853 Personal history of malignant neoplasm of breast: Secondary | ICD-10-CM | POA: Diagnosis not present

## 2024-10-11 DIAGNOSIS — E669 Obesity, unspecified: Secondary | ICD-10-CM | POA: Diagnosis not present

## 2024-10-11 DIAGNOSIS — N182 Chronic kidney disease, stage 2 (mild): Secondary | ICD-10-CM

## 2024-10-11 DIAGNOSIS — Z6835 Body mass index (BMI) 35.0-35.9, adult: Secondary | ICD-10-CM | POA: Diagnosis not present

## 2024-10-11 DIAGNOSIS — Z01812 Encounter for preprocedural laboratory examination: Secondary | ICD-10-CM

## 2024-10-11 DIAGNOSIS — S85002A Unspecified injury of popliteal artery, left leg, initial encounter: Secondary | ICD-10-CM | POA: Insufficient documentation

## 2024-10-11 HISTORY — PX: KNEE ARTHROPLASTY: SHX992

## 2024-10-11 LAB — HEMOGLOBIN AND HEMATOCRIT, BLOOD
HCT: 34.7 % — ABNORMAL LOW (ref 36.0–46.0)
Hemoglobin: 11.2 g/dL — ABNORMAL LOW (ref 12.0–15.0)

## 2024-10-11 LAB — ABO/RH: ABO/RH(D): B POS

## 2024-10-11 SURGERY — ARTHROPLASTY, KNEE, TOTAL, USING IMAGELESS COMPUTER-ASSISTED NAVIGATION
Anesthesia: General | Site: Knee | Laterality: Left

## 2024-10-11 MED ORDER — HEMOSTATIC AGENTS (NO CHARGE) OPTIME
TOPICAL | Status: DC | PRN
  Administered 2024-10-11: 1 via TOPICAL

## 2024-10-11 MED ORDER — SENNOSIDES-DOCUSATE SODIUM 8.6-50 MG PO TABS
1.0000 | ORAL_TABLET | Freq: Two times a day (BID) | ORAL | Status: DC
  Administered 2024-10-11 – 2024-10-13 (×4): 1 via ORAL
  Filled 2024-10-11 (×4): qty 1

## 2024-10-11 MED ORDER — CHLORHEXIDINE GLUCONATE 4 % EX SOLN: 60.0000 mL | Freq: Once | CUTANEOUS | Status: DC

## 2024-10-11 MED ORDER — HYDROMORPHONE HCL 1 MG/ML IJ SOLN
INTRAMUSCULAR | Status: DC | PRN
  Administered 2024-10-11 (×2): .2 mg via INTRAVENOUS

## 2024-10-11 MED ORDER — OXYCODONE HCL 5 MG/5ML PO SOLN: 5.0000 mg | Freq: Once | ORAL | Status: AC | PRN

## 2024-10-11 MED ORDER — FENTANYL CITRATE (PF) 100 MCG/2ML IJ SOLN
INTRAMUSCULAR | Status: DC | PRN
  Administered 2024-10-11 (×4): 25 ug via INTRAVENOUS

## 2024-10-11 MED ORDER — LIDOCAINE HCL (PF) 1 % IJ SOLN
INTRAMUSCULAR | Status: DC | PRN
  Administered 2024-10-11: 5 mL

## 2024-10-11 MED ORDER — ENSURE PRE-SURGERY PO LIQD
296.0000 mL | Freq: Once | ORAL | Status: DC
  Filled 2024-10-11: qty 296

## 2024-10-11 MED ORDER — HEPARIN SODIUM (PORCINE) 1000 UNIT/ML IJ SOLN
INTRAMUSCULAR | Status: DC | PRN
  Administered 2024-10-11: 3000 [IU] via INTRAVENOUS

## 2024-10-11 MED ORDER — ALUM & MAG HYDROXIDE-SIMETH 200-200-20 MG/5ML PO SUSP: 30.0000 mL | ORAL | Status: DC | PRN

## 2024-10-11 MED ORDER — OXYCODONE HCL 5 MG PO TABS
10.0000 mg | ORAL_TABLET | ORAL | Status: DC | PRN
  Filled 2024-10-11: qty 2

## 2024-10-11 MED ORDER — ONDANSETRON HCL 4 MG/2ML IJ SOLN: 4.0000 mg | Freq: Four times a day (QID) | INTRAMUSCULAR | Status: DC | PRN

## 2024-10-11 MED ORDER — OXYCODONE HCL 5 MG PO TABS
5.0000 mg | ORAL_TABLET | ORAL | Status: DC | PRN
  Administered 2024-10-12: 5 mg via ORAL

## 2024-10-11 MED ORDER — EPHEDRINE SULFATE-NACL 50-0.9 MG/10ML-% IV SOSY
PREFILLED_SYRINGE | INTRAVENOUS | Status: DC | PRN
  Administered 2024-10-11: 5 mg via INTRAVENOUS
  Administered 2024-10-11: 10 mg via INTRAVENOUS
  Administered 2024-10-11 (×2): 5 mg via INTRAVENOUS

## 2024-10-11 MED ORDER — OXYCODONE HCL 5 MG PO TABS
5.0000 mg | ORAL_TABLET | Freq: Once | ORAL | Status: AC | PRN
  Administered 2024-10-11: 5 mg via ORAL

## 2024-10-11 MED ORDER — METOCLOPRAMIDE HCL 5 MG PO TABS
10.0000 mg | ORAL_TABLET | Freq: Three times a day (TID) | ORAL | Status: DC
Start: 2024-10-11 — End: 2024-10-13
  Administered 2024-10-11 – 2024-10-13 (×7): 10 mg via ORAL
  Filled 2024-10-11 (×7): qty 2

## 2024-10-11 MED ORDER — HEPARIN SODIUM (PORCINE) 5000 UNIT/ML IJ SOLN
INTRAMUSCULAR | Status: AC
  Filled 2024-10-11: qty 1

## 2024-10-11 MED ORDER — FENTANYL CITRATE (PF) 100 MCG/2ML IJ SOLN
INTRAMUSCULAR | Status: AC
  Filled 2024-10-11: qty 2

## 2024-10-11 MED ORDER — ONDANSETRON HCL 4 MG/2ML IJ SOLN
INTRAMUSCULAR | Status: AC
  Filled 2024-10-11: qty 2

## 2024-10-11 MED ORDER — ACETAMINOPHEN 10 MG/ML IV SOLN
1000.0000 mg | Freq: Four times a day (QID) | INTRAVENOUS | Status: AC
  Administered 2024-10-12 (×3): 1000 mg via INTRAVENOUS
  Filled 2024-10-11 (×4): qty 100

## 2024-10-11 MED ORDER — HYDROMORPHONE HCL 1 MG/ML IJ SOLN
INTRAMUSCULAR | Status: AC
  Filled 2024-10-11: qty 1

## 2024-10-11 MED ORDER — MIDAZOLAM HCL 5 MG/5ML IJ SOLN
INTRAMUSCULAR | Status: DC | PRN
  Administered 2024-10-11 (×2): 1 mg via INTRAVENOUS

## 2024-10-11 MED ORDER — SODIUM CHLORIDE 0.9 % IV SOLN: INTRAVENOUS | Status: DC

## 2024-10-11 MED ORDER — PANTOPRAZOLE SODIUM 40 MG PO TBEC
40.0000 mg | DELAYED_RELEASE_TABLET | Freq: Two times a day (BID) | ORAL | Status: DC
  Administered 2024-10-11 – 2024-10-13 (×4): 40 mg via ORAL
  Filled 2024-10-11 (×4): qty 1

## 2024-10-11 MED ORDER — TRANEXAMIC ACID-NACL 1000-0.7 MG/100ML-% IV SOLN
1000.0000 mg | INTRAVENOUS | Status: AC
  Administered 2024-10-11: 1000 mg via INTRAVENOUS

## 2024-10-11 MED ORDER — BUPIVACAINE LIPOSOME 1.3 % IJ SUSP
INTRAMUSCULAR | Status: AC
  Filled 2024-10-11: qty 20

## 2024-10-11 MED ORDER — ACETAMINOPHEN 325 MG PO TABS: 325.0000 mg | ORAL_TABLET | Freq: Four times a day (QID) | ORAL | Status: DC | PRN

## 2024-10-11 MED ORDER — MENTHOL 3 MG MT LOZG: 1.0000 | LOZENGE | OROMUCOSAL | Status: DC | PRN

## 2024-10-11 MED ORDER — PROPOFOL 1000 MG/100ML IV EMUL
INTRAVENOUS | Status: AC
  Filled 2024-10-11: qty 100

## 2024-10-11 MED ORDER — BUPIVACAINE HCL (PF) 0.5 % IJ SOLN
INTRAMUSCULAR | Status: DC | PRN
  Administered 2024-10-11: 3 mL via INTRATHECAL

## 2024-10-11 MED ORDER — ORAL CARE MOUTH RINSE: 15.0000 mL | Freq: Once | OROMUCOSAL | Status: AC

## 2024-10-11 MED ORDER — PROPOFOL 500 MG/50ML IV EMUL
INTRAVENOUS | Status: DC | PRN
  Administered 2024-10-11: 50 ug/kg/min via INTRAVENOUS

## 2024-10-11 MED ORDER — GABAPENTIN 300 MG PO CAPS
300.0000 mg | ORAL_CAPSULE | Freq: Once | ORAL | Status: AC
  Administered 2024-10-11: 300 mg via ORAL

## 2024-10-11 MED ORDER — HYDROMORPHONE HCL 1 MG/ML IJ SOLN: 0.5000 mg | INTRAMUSCULAR | Status: DC | PRN

## 2024-10-11 MED ORDER — SURGIFLO WITH THROMBIN (HEMOSTATIC MATRIX KIT) OPTIME
TOPICAL | Status: DC | PRN
  Administered 2024-10-11: 1 via TOPICAL

## 2024-10-11 MED ORDER — POLYETHYL GLYCOL-PROPYL GLYCOL 0.4-0.3 % OP GEL: Freq: Every day | OPHTHALMIC | Status: DC | PRN

## 2024-10-11 MED ORDER — TRANEXAMIC ACID-NACL 1000-0.7 MG/100ML-% IV SOLN
1000.0000 mg | Freq: Once | INTRAVENOUS | Status: AC
  Administered 2024-10-11: 1000 mg via INTRAVENOUS

## 2024-10-11 MED ORDER — LIDOCAINE HCL (PF) 2 % IJ SOLN
INTRAMUSCULAR | Status: AC
  Filled 2024-10-11: qty 5

## 2024-10-11 MED ORDER — GABAPENTIN 300 MG PO CAPS
ORAL_CAPSULE | ORAL | Status: AC
  Filled 2024-10-11: qty 1

## 2024-10-11 MED ORDER — CELECOXIB 200 MG PO CAPS
ORAL_CAPSULE | ORAL | Status: AC
  Filled 2024-10-11: qty 2

## 2024-10-11 MED ORDER — OXYCODONE HCL 5 MG PO TABS
ORAL_TABLET | ORAL | Status: AC
  Filled 2024-10-11: qty 1

## 2024-10-11 MED ORDER — FLUTICASONE PROPIONATE 50 MCG/ACT NA SUSP: 1.0000 | Freq: Every day | NASAL | Status: DC | PRN

## 2024-10-11 MED ORDER — GLYCOPYRROLATE 0.2 MG/ML IJ SOLN
INTRAMUSCULAR | Status: AC
  Filled 2024-10-11: qty 1

## 2024-10-11 MED ORDER — CEFAZOLIN SODIUM-DEXTROSE 2-4 GM/100ML-% IV SOLN
2.0000 g | Freq: Four times a day (QID) | INTRAVENOUS | Status: AC
  Administered 2024-10-12 (×2): 2 g via INTRAVENOUS
  Filled 2024-10-11 (×2): qty 100

## 2024-10-11 MED ORDER — TRAMADOL HCL 50 MG PO TABS
50.0000 mg | ORAL_TABLET | ORAL | Status: DC | PRN
  Administered 2024-10-12 – 2024-10-13 (×2): 50 mg via ORAL
  Filled 2024-10-11 (×2): qty 1

## 2024-10-11 MED ORDER — SODIUM CHLORIDE 0.9 % IR SOLN
Status: DC | PRN
  Administered 2024-10-11: 3000 mL
  Administered 2024-10-11: 500 mL

## 2024-10-11 MED ORDER — TRANEXAMIC ACID-NACL 1000-0.7 MG/100ML-% IV SOLN
INTRAVENOUS | Status: AC
  Filled 2024-10-11: qty 100

## 2024-10-11 MED ORDER — MAGNESIUM HYDROXIDE 400 MG/5ML PO SUSP
30.0000 mL | Freq: Every day | ORAL | Status: DC
  Administered 2024-10-11 – 2024-10-13 (×3): 30 mL via ORAL
  Filled 2024-10-11 (×3): qty 30

## 2024-10-11 MED ORDER — ASPIRIN 81 MG PO CHEW
81.0000 mg | CHEWABLE_TABLET | Freq: Two times a day (BID) | ORAL | Status: DC
  Administered 2024-10-12 – 2024-10-13 (×3): 81 mg via ORAL
  Filled 2024-10-11 (×3): qty 1

## 2024-10-11 MED ORDER — PHENOL 1.4 % MT LIQD: 1.0000 | OROMUCOSAL | Status: DC | PRN

## 2024-10-11 MED ORDER — CELECOXIB 200 MG PO CAPS
400.0000 mg | ORAL_CAPSULE | Freq: Once | ORAL | Status: AC
  Administered 2024-10-11: 400 mg via ORAL

## 2024-10-11 MED ORDER — FLEET ENEMA RE ENEM: 1.0000 | ENEMA | Freq: Once | RECTAL | Status: DC | PRN

## 2024-10-11 MED ORDER — ONDANSETRON HCL 4 MG/2ML IJ SOLN
INTRAMUSCULAR | Status: DC | PRN
  Administered 2024-10-11 (×2): 4 mg via INTRAVENOUS

## 2024-10-11 MED ORDER — ONDANSETRON HCL 4 MG PO TABS: 4.0000 mg | ORAL_TABLET | Freq: Four times a day (QID) | ORAL | Status: DC | PRN

## 2024-10-11 MED ORDER — SODIUM CHLORIDE 0.9 % IV SOLN
INTRAVENOUS | Status: DC | PRN
  Administered 2024-10-11: 501 mL

## 2024-10-11 MED ORDER — BUPIVACAINE HCL (PF) 0.5 % IJ SOLN
INTRAMUSCULAR | Status: AC
  Filled 2024-10-11: qty 10

## 2024-10-11 MED ORDER — CELECOXIB 200 MG PO CAPS
200.0000 mg | ORAL_CAPSULE | Freq: Two times a day (BID) | ORAL | Status: DC
  Administered 2024-10-12 – 2024-10-13 (×3): 200 mg via ORAL
  Filled 2024-10-11 (×4): qty 1

## 2024-10-11 MED ORDER — EPHEDRINE 5 MG/ML INJ
INTRAVENOUS | Status: AC
  Filled 2024-10-11: qty 5

## 2024-10-11 MED ORDER — PHENYLEPHRINE 80 MCG/ML (10ML) SYRINGE FOR IV PUSH (FOR BLOOD PRESSURE SUPPORT)
PREFILLED_SYRINGE | INTRAVENOUS | Status: AC
  Filled 2024-10-11: qty 10

## 2024-10-11 MED ORDER — CEFAZOLIN SODIUM-DEXTROSE 2-4 GM/100ML-% IV SOLN
INTRAVENOUS | Status: AC
  Filled 2024-10-11: qty 100

## 2024-10-11 MED ORDER — VANCOMYCIN HCL 1000 MG IV SOLR
INTRAVENOUS | Status: AC
  Filled 2024-10-11: qty 20

## 2024-10-11 MED ORDER — GLYCOPYRROLATE 0.2 MG/ML IJ SOLN
INTRAMUSCULAR | Status: DC | PRN
  Administered 2024-10-11 (×2): .2 mg via INTRAVENOUS

## 2024-10-11 MED ORDER — LACTATED RINGERS IV SOLN: INTRAVENOUS | Status: DC

## 2024-10-11 MED ORDER — DEXMEDETOMIDINE HCL IN NACL 80 MCG/20ML IV SOLN
INTRAVENOUS | Status: DC | PRN
  Administered 2024-10-11 (×3): 4 ug via INTRAVENOUS

## 2024-10-11 MED ORDER — ESMOLOL HCL 100 MG/10ML IV SOLN
INTRAVENOUS | Status: DC | PRN
  Administered 2024-10-11: 20 mg via INTRAVENOUS

## 2024-10-11 MED ORDER — FENTANYL CITRATE (PF) 100 MCG/2ML IJ SOLN
25.0000 ug | INTRAMUSCULAR | Status: DC | PRN
  Administered 2024-10-11 (×2): 25 ug via INTRAVENOUS

## 2024-10-11 MED ORDER — CHLORHEXIDINE GLUCONATE 0.12 % MT SOLN
15.0000 mL | Freq: Once | OROMUCOSAL | Status: AC
  Administered 2024-10-11: 15 mL via OROMUCOSAL

## 2024-10-11 MED ORDER — FERROUS SULFATE 325 (65 FE) MG PO TABS
325.0000 mg | ORAL_TABLET | Freq: Two times a day (BID) | ORAL | Status: DC
  Administered 2024-10-12 – 2024-10-13 (×3): 325 mg via ORAL
  Filled 2024-10-11 (×3): qty 1

## 2024-10-11 MED ORDER — MIDAZOLAM HCL 2 MG/2ML IJ SOLN
INTRAMUSCULAR | Status: AC
  Filled 2024-10-11: qty 2

## 2024-10-11 MED ORDER — PHENYLEPHRINE 80 MCG/ML (10ML) SYRINGE FOR IV PUSH (FOR BLOOD PRESSURE SUPPORT)
PREFILLED_SYRINGE | INTRAVENOUS | Status: DC | PRN
  Administered 2024-10-11 (×6): 80 ug via INTRAVENOUS
  Administered 2024-10-11: 160 ug via INTRAVENOUS
  Administered 2024-10-11: 80 ug via INTRAVENOUS
  Administered 2024-10-11: 160 ug via INTRAVENOUS
  Administered 2024-10-11 (×4): 80 ug via INTRAVENOUS

## 2024-10-11 MED ORDER — CHLORHEXIDINE GLUCONATE 4 % EX SOLN: 1.0000 | CUTANEOUS | 1 refills | Status: DC

## 2024-10-11 MED ORDER — PROPOFOL 10 MG/ML IV BOLUS
INTRAVENOUS | Status: DC | PRN
  Administered 2024-10-11: 30 mg via INTRAVENOUS
  Administered 2024-10-11: 20 mg via INTRAVENOUS

## 2024-10-11 MED ORDER — BISACODYL 10 MG RE SUPP: 10.0000 mg | Freq: Every day | RECTAL | Status: DC | PRN

## 2024-10-11 MED ORDER — SODIUM CHLORIDE (PF) 0.9 % IJ SOLN
INTRAMUSCULAR | Status: DC | PRN
  Administered 2024-10-11: 120 mL

## 2024-10-11 MED ORDER — DIPHENHYDRAMINE HCL 12.5 MG/5ML PO ELIX: 12.5000 mg | ORAL_SOLUTION | ORAL | Status: DC | PRN

## 2024-10-11 MED ORDER — DEXAMETHASONE SOD PHOSPHATE PF 10 MG/ML IJ SOLN
8.0000 mg | Freq: Once | INTRAMUSCULAR | Status: AC
  Administered 2024-10-11: 8 mg via INTRAVENOUS

## 2024-10-11 MED ORDER — LIDOCAINE HCL (CARDIAC) PF 100 MG/5ML IV SOSY
PREFILLED_SYRINGE | INTRAVENOUS | Status: DC | PRN
  Administered 2024-10-11: 60 mg via INTRAVENOUS

## 2024-10-11 MED ORDER — ACETAMINOPHEN 10 MG/ML IV SOLN
INTRAVENOUS | Status: AC
  Filled 2024-10-11: qty 100

## 2024-10-11 MED ORDER — MUPIROCIN 2 % EX OINT: 1.0000 | TOPICAL_OINTMENT | Freq: Two times a day (BID) | CUTANEOUS | 0 refills | Status: DC

## 2024-10-11 MED ORDER — ACETAMINOPHEN 10 MG/ML IV SOLN
INTRAVENOUS | Status: DC | PRN
  Administered 2024-10-11: 1000 mg via INTRAVENOUS

## 2024-10-11 MED ADMIN — Cefazolin Sodium-Dextrose IV Solution 2 GM/100ML-4%: 2 g | INTRAVENOUS | NDC 00338350841

## 2024-10-11 MED ADMIN — FIBRINOGEN-THROMBIN PREFILLED SYRINGE KIT 4 ML: 4 mL | TOPICAL | NDC 61953001201

## 2024-10-11 MED FILL — Esmolol HCl Inj 100 MG/10ML: INTRAVENOUS | Qty: 10 | Status: AC

## 2024-10-11 MED FILL — Fibrinogen-Thrombin Prefilled Syringe Kit 4 ML: CUTANEOUS | Qty: 4 | Status: AC

## 2024-10-11 MED FILL — Heparin Sodium (Porcine) Inj 1000 Unit/ML: INTRAMUSCULAR | Qty: 10 | Status: AC

## 2024-10-11 MED FILL — Glycopyrrolate Inj 0.2 MG/ML: INTRAMUSCULAR | Qty: 1 | Status: AC

## 2024-10-11 MED FILL — Chlorhexidine Gluconate Soln 0.12%: OROMUCOSAL | Qty: 15 | Status: AC

## 2024-10-11 SURGICAL SUPPLY — 72 items
ATTUNE MED DOME PAT 41 KNEE (Knees) IMPLANT
ATTUNE PS FEM LT SZ 8 CEM KNEE (Femur) IMPLANT
BASE TIBIAL ROT PLAT SZ 8 KNEE (Knees) IMPLANT
BATTERY INSTRU NAVIGATION (MISCELLANEOUS) ×4 IMPLANT
BIT DRILL QUICK REL 1/8 2PK SL (BIT) ×1 IMPLANT
BLADE CLIPPER SURG (BLADE) IMPLANT
BLADE SAW 70X12.5 (BLADE) ×1 IMPLANT
BLADE SAW 90X13X1.19 OSCILLAT (BLADE) ×1 IMPLANT
BLADE SAW 90X25X1.19 OSCILLAT (BLADE) ×1 IMPLANT
BRUSH SCRUB EZ PLAIN DRY (MISCELLANEOUS) ×1 IMPLANT
CEMENT BONE GENTAMICIN 40 (Cement) IMPLANT
CLAMP SUTURE YELLOW 5 PAIRS (MISCELLANEOUS) IMPLANT
COOLER ICEMAN CLASSIC (MISCELLANEOUS) ×1 IMPLANT
CUFF TRNQT CYL 24X4X16.5-23 (TOURNIQUET CUFF) IMPLANT
CUFF TRNQT CYL 30X4X21-28X (TOURNIQUET CUFF) IMPLANT
DRAPE SHEET LG 3/4 BI-LAMINATE (DRAPES) ×1 IMPLANT
DRESSING SURGICEL FIBRLLR 1X2 (HEMOSTASIS) IMPLANT
DRSG AQUACEL AG ADV 3.5X14 (GAUZE/BANDAGES/DRESSINGS) ×1 IMPLANT
DRSG MEPILEX SACRM 8.7X9.8 (GAUZE/BANDAGES/DRESSINGS) ×1 IMPLANT
DRSG TEGADERM 4X4.75 (GAUZE/BANDAGES/DRESSINGS) ×1 IMPLANT
DURAPREP 26ML APPLICATOR (WOUND CARE) ×2 IMPLANT
ELECT CAUTERY BLADE 6.4 (BLADE) ×1 IMPLANT
ELECTRODE REM PT RTRN 9FT ADLT (ELECTROSURGICAL) ×1 IMPLANT
EVACUATOR 1/8 PVC DRAIN (DRAIN) ×1 IMPLANT
EX-PIN ORTHOLOCK NAV 4X150 (PIN) ×2 IMPLANT
GAUZE XEROFORM 1X8 LF (GAUZE/BANDAGES/DRESSINGS) ×1 IMPLANT
GLOVE BIOGEL M STRL SZ7.5 (GLOVE) ×6 IMPLANT
GLOVE BIOGEL PI IND STRL 8 (GLOVE) ×1 IMPLANT
GLOVE SRG 8 PF TXTR STRL LF DI (GLOVE) ×1 IMPLANT
GOWN STRL REUS W/ TWL LRG LVL3 (GOWN DISPOSABLE) ×1 IMPLANT
GOWN STRL REUS W/ TWL XL LVL3 (GOWN DISPOSABLE) ×1 IMPLANT
GOWN TOGA ZIPPER T7+ PEEL AWAY (MISCELLANEOUS) ×1 IMPLANT
GRAFT COLLAGEN VASCULAR 7X45 (Vascular Products) IMPLANT
HOLDER FOLEY CATH W/STRAP (MISCELLANEOUS) ×1 IMPLANT
HOOD PEEL AWAY T7 (MISCELLANEOUS) ×1 IMPLANT
INSERT TIB ATTUNE RP SZ8X18 (Insert) IMPLANT
KIT TURNOVER KIT A (KITS) ×1 IMPLANT
KNIFE SCULPS 14X20 (INSTRUMENTS) ×1 IMPLANT
MANIFOLD NEPTUNE II (INSTRUMENTS) ×2 IMPLANT
NDL SPNL 20GX3.5 QUINCKE YW (NEEDLE) ×2 IMPLANT
NEEDLE SPNL 20GX3.5 QUINCKE YW (NEEDLE) ×2 IMPLANT
NS IRRIG 500ML POUR BTL (IV SOLUTION) IMPLANT
PACK TOTAL KNEE (MISCELLANEOUS) ×1 IMPLANT
PAD ABD DERMACEA PRESS 5X9 (GAUZE/BANDAGES/DRESSINGS) ×2 IMPLANT
PAD ARMBOARD POSITIONER FOAM (MISCELLANEOUS) ×3 IMPLANT
PAD COLD UNI WRAP-ON (PAD) ×1 IMPLANT
PENCIL SMOKE EVACUATOR COATED (MISCELLANEOUS) ×1 IMPLANT
PIN DRILL FIX HALF THREAD (BIT) ×2 IMPLANT
PIN FIXATION 1/8DIA X 3INL (PIN) ×1 IMPLANT
SOL .9 NS 3000ML IRR UROMATIC (IV SOLUTION) ×1 IMPLANT
SOLN STERILE WATER 1000 ML (IV SOLUTION) ×1 IMPLANT
SOLN STERILE WATER BTL 1000 ML (IV SOLUTION) ×1 IMPLANT
SOLUTION IRRIG SURGIPHOR (IV SOLUTION) ×1 IMPLANT
SPONGE DRAIN TRACH 4X4 STRL 2S (GAUZE/BANDAGES/DRESSINGS) ×1 IMPLANT
SPONGE T-LAP 18X18 ~~LOC~~+RFID (SPONGE) IMPLANT
STAPLER SKIN PROX 35W (STAPLE) ×1 IMPLANT
STOCKINETTE IMPERV 14X48 (MISCELLANEOUS) ×1 IMPLANT
STOCKINETTE STRL BIAS CUT 8X4 (MISCELLANEOUS) ×1 IMPLANT
STRAP TIBIA SHORT (MISCELLANEOUS) ×1 IMPLANT
SUCTION TUBE FRAZIER 10FR DISP (SUCTIONS) ×1 IMPLANT
SURGIFLO W/THROMBIN 8M KIT (HEMOSTASIS) IMPLANT
SUT PROLENE 5 0 RB 1 DA (SUTURE) IMPLANT
SUT PROLENE 6 0 BV (SUTURE) IMPLANT
SUT VIC AB 0 CT1 36 (SUTURE) ×1 IMPLANT
SUT VIC AB 1 CT1 36 (SUTURE) ×2 IMPLANT
SUT VIC AB 2-0 CT2 27 (SUTURE) ×1 IMPLANT
SYR 30ML LL (SYRINGE) ×2 IMPLANT
TIP FAN IRRIG PULSAVAC PLUS (DISPOSABLE) ×1 IMPLANT
TOWEL OR 17X26 4PK STRL BLUE (TOWEL DISPOSABLE) IMPLANT
TOWER CARTRIDGE SMART MIX (DISPOSABLE) ×1 IMPLANT
TRAP FLUID SMOKE EVACUATOR (MISCELLANEOUS) ×1 IMPLANT
TRAY FOLEY MTR SLVR 16FR STAT (SET/KITS/TRAYS/PACK) ×1 IMPLANT

## 2024-10-11 NOTE — Op Note (Signed)
 OPERATIVE NOTE  DATE OF SURGERY:  10/11/2024  PATIENT NAME:  Stacy Warren   DOB: 10-27-1951  MRN: [id number]  PRE-OPERATIVE DIAGNOSIS: Degenerative arthrosis of the left knee, primary  POST-OPERATIVE DIAGNOSIS:  Same  PROCEDURE:  Left total knee arthroplasty using computer-assisted navigation  SURGEON:  Lynwood SHAUNNA Mardee Mickey. M.D.  ANESTHESIA: spinal and general  ESTIMATED BLOOD LOSS: 500 mL  FLUIDS REPLACED: 2000 mL of crystalloid  TOURNIQUET TIME: 191 minutes  DRAINS: 2 medium Hemovac drains  SOFT TISSUE RELEASES: Anterior cruciate ligament, posterior cruciate ligament, deep medial collateral ligament, patellofemoral ligament  IMPLANTS UTILIZED: DePuy Attune size 8 posterior stabilized femoral component (cemented), size 8 rotating platform tibial component (cemented), 41 mm medialized dome patella (cemented), and an 18 mm stabilized rotating platform polyethylene insert.  COMPLICATION: Popliteal artery laceration (see separate Operative Report by Dr. Selinda Gu for details regarding the repair.)  INDICATIONS FOR SURGERY: Stacy Warren is a 73 y.o. year old female with a long history of progressive knee pain. X-rays demonstrated severe degenerative changes in tricompartmental fashion. The patient had not seen any significant improvement despite conservative nonsurgical intervention. After discussion of the risks and benefits of surgical intervention, the patient expressed understanding of the risks benefits and agree with plans for total knee arthroplasty.   The risks, benefits, and alternatives were discussed at length including but not limited to the risks of infection, bleeding, nerve injury, stiffness, blood clots, the need for revision surgery, cardiopulmonary complications, among others, and they were willing to proceed.  PROCEDURE IN DETAIL: The patient was brought into the operating room and, after adequate spinal anesthesia was achieved, a tourniquet was placed on the  patient's upper thigh. The patient's knee and leg were cleaned and prepped with alcohol and DuraPrep and draped in the usual sterile fashion. A timeout was performed as per usual protocol. The lower extremity was exsanguinated using an Esmarch, and the tourniquet was inflated to 300 mmHg. An anterior longitudinal incision was made followed by a standard mid vastus approach. The deep fibers of the medial collateral ligament were elevated in a subperiosteal fashion off of the medial flare of the tibia so as to maintain a continuous soft tissue sleeve. The patella was subluxed laterally and the patellofemoral ligament was incised. Inspection of the knee demonstrated severe degenerative changes with full-thickness loss of articular cartilage.  Prominent osteophytes were debrided using a rongeur. Anterior and posterior cruciate ligaments were excised. Two 4.0 mm Schanz pins were inserted in the femur and into the tibia for attachment of the array of trackers used for computer-assisted navigation. Hip center was identified using a circumduction technique. Distal landmarks were mapped using the computer. The distal femur and proximal tibia were mapped using the computer. The distal femoral cutting guide was positioned using computer-assisted navigation so as to achieve a 5 distal valgus cut. The femur was sized and it was felt that a size 8 femoral component was appropriate. A size 8 femoral cutting guide was positioned and the anterior cut was performed and verified using the computer. This was followed by completion of the posterior and chamfer cuts. Femoral cutting guide for the central box was then positioned in the center box cut was performed.  Attention was then directed to the proximal tibia. Medial and lateral menisci were excised. The extramedullary tibial cutting guide was positioned using computer-assisted navigation so as to achieve a 0 varus-valgus alignment and 3 posterior slope. The cut was performed  and verified using the computer. The proximal  tibia was sized and it was felt that a size 8 tibial tray was appropriate. Tibial and femoral trials were inserted followed by insertion of an 18 mm polyethylene insert. This allowed for excellent mediolateral soft tissue balancing both in flexion and in full extension. Finally, the patella was cut and prepared so as to accommodate a 41 mm medialized dome patella. A patella trial was placed and the knee was placed through a range of motion with excellent patellar tracking appreciated. The femoral trial was removed after debridement of posterior osteophytes. The central post-hole for the tibial component was reamed followed by insertion of a keel punch. Tibial trials were then removed.    The tourniquet was released at this point and there was noted to be significant hemorrhage from the popliteal region.  The tourniquet was immediately reinflated and the knee extended.  2 Moreland retractors were inserted's so as to better visualize the popliteal region.  A laceration of the popliteal artery was noted.  A stat call for Vascular Surgery (Dr. Selinda Gu) was performed.  He confirmed the diagnosis of a popliteal artery laceration and recommended use of a graft to minimize additional tension on repair.  Please see the separate Operative Report by Dr. Gu for full details regarding the treatment of the popliteal artery laceration.  After Dr. Gu completed the repair and had established good hemostasis, he stated that it was safe to proceed with completion of the orthopaedic procedure. Cut surfaces of bone were irrigated with copious amounts of normal saline using pulsatile lavage and then suctioned dry. Polymethylmethacrylate cement with gentamicin was prepared in the usual fashion using a vacuum mixer. Cement was applied to the cut surface of the proximal tibia as well as along the undersurface of a size 8 rotating platform tibial component. Tibial component was positioned  and impacted into place. Excess cement was removed using Personal assistant. Cement was then applied to the cut surfaces of the femur as well as along the posterior flanges of the size 8 femoral component. The femoral component was positioned and impacted into place. Excess cement was removed using Personal assistant. An 18 mm polyethylene trial was inserted and the knee was brought into full extension with steady axial compression applied. Finally, cement was applied to the backside of a 41 mm medialized dome patella and the patellar component was positioned and patellar clamp applied. Excess cement was removed using Personal assistant. The knee was irrigated with copious amounts of normal saline using pulsatile lavage followed by 450 ml of Surgiphor and then suctioned dry. 20 mL of 1.3% Exparel  and 60 mL of 0.25% Marcaine  in 40 mL of normal saline was injected along the capsule, medial and lateral gutters, and along the arthrotomy site. An 18 mm stabilized rotating platform polyethylene insert was inserted and the knee was placed through a range of motion with excellent mediolateral soft tissue balancing appreciated and excellent patellar tracking noted. 2 medium drains were placed in the wound bed and brought out through separate stab incisions. The medial parapatellar portion of the incision was reapproximated using interrupted sutures of #1 Vicryl. Subcutaneous tissue was approximated in layers using first #0 Vicryl followed #2-0 Vicryl. The skin was approximated with skin staples. A sterile dressing was applied.  The patient tolerated the procedure well and was transported to the recovery room in stable condition.    Enzio Buchler P. Zenna Traister, Jr., M.D.

## 2024-10-11 NOTE — Progress Notes (Signed)
 Patient is not able to walk the distance required to go the bathroom, or he/she is unable to safely negotiate stairs required to access the bathroom.  A 3in1 BSC will alleviate this problem   Stacy Warren P. Angie Fava M.D.

## 2024-10-11 NOTE — Anesthesia Postprocedure Evaluation (Signed)
 Anesthesia Post Note  Patient: Stacy Warren  Procedure(s) Performed: ARTHROPLASTY, KNEE, TOTAL, USING IMAGELESS COMPUTER-ASSISTED NAVIGATION AND REPAIR OF POPLITEAL ARTERY -- POPLITEAL ARTERY BYPASS WITH GRAFT (Left: Knee)  Patient location during evaluation: PACU Anesthesia Type: Spinal Level of consciousness: awake and alert Pain management: pain level controlled Vital Signs Assessment: post-procedure vital signs reviewed and stable Respiratory status: spontaneous breathing, nonlabored ventilation, respiratory function stable and patient connected to nasal cannula oxygen Cardiovascular status: blood pressure returned to baseline and stable Postop Assessment: no apparent nausea or vomiting Anesthetic complications: no   No notable events documented.   Last Vitals:  Vitals:   10/11/24 2055 10/11/24 2118  BP:  105/64  Pulse: 82 70  Resp: 18 17  Temp:    SpO2: 98% 93%    Last Pain:  Vitals:   10/11/24 2040  TempSrc:   PainSc: 5                  Debby Mines

## 2024-10-11 NOTE — Transfer of Care (Signed)
 Immediate Anesthesia Transfer of Care Note  Patient: Stacy Warren  Procedure(s) Performed: ARTHROPLASTY, KNEE, TOTAL, USING IMAGELESS COMPUTER-ASSISTED NAVIGATION AND REPAIR OF POPLITEAL ARTERY -- POPLITEAL ARTERY BYPASS WITH GRAFT (Left: Knee)  Patient Location: PACU  Anesthesia Type:General and Spinal  Level of Consciousness: awake, alert , and oriented  Airway & Oxygen Therapy: Patient Spontanous Breathing and Patient connected to face mask oxygen  Post-op Assessment: Report given to RN, Post -op Vital signs reviewed and stable, and Patient moving all extremities  Post vital signs: Reviewed and stable  Last Vitals:  Vitals Value Taken Time  BP 199/179 10/11/24 19:55  Temp    Pulse 86 10/11/24 19:57  Resp 18 10/11/24 19:57  SpO2 94 % 10/11/24 19:57  Vitals shown include unfiled device data.  Last Pain:  Vitals:   10/11/24 1005  TempSrc: Tympanic  PainSc: 0-No pain         Complications: No notable events documented.

## 2024-10-11 NOTE — Anesthesia Preprocedure Evaluation (Addendum)
 Anesthesia Evaluation  Patient identified by MRN, date of birth, ID band Patient awake    Reviewed: Allergy & Precautions, NPO status , Patient's Chart, lab work & pertinent test results  History of Anesthesia Complications Negative for: history of anesthetic complications  Airway Mallampati: II  TM Distance: >3 FB Neck ROM: full    Dental no notable dental hx.    Pulmonary neg pulmonary ROS   Pulmonary exam normal        Cardiovascular negative cardio ROS Normal cardiovascular exam     Neuro/Psych negative neurological ROS  negative psych ROS   GI/Hepatic negative GI ROS, Neg liver ROS,,,  Endo/Other  negative endocrine ROS    Renal/GU CRFRenal disease  negative genitourinary   Musculoskeletal   Abdominal   Peds  Hematology negative hematology ROS (+) Hx of DVT/PE s/p IVC filter   Anesthesia Other Findings Past Medical History: 06/11/2020: Aromatase inhibitor use No date: Arthritis No date: Breast cancer (HCC) No date: Cancer (HCC) No date: Glaucoma No date: Motion sickness     Comment:  back  seat - cars 11/28/2018: Osteopenia 04/09/2018: Pulmonary embolism (HCC)     Comment:  Completed course of blood thinner in 2007; after femur               fracture and surgery  Past Surgical History: 01/23/2024: BIOPSY     Comment:  Procedure: BIOPSY;  Surgeon: Unk Corinn Skiff, MD;                Location: [facility];  Service: Endoscopy;; 12/13/2018: BREAST BIOPSY; Right     Comment:  stereo bx of mass, coil clip INVASIVE [facility]MARY CARCINOMA 12/31/2018: BREAST LUMPECTOMY; Right     Comment:  [facility] 12/31/2018: BREAST LUMPECTOMY WITH NEEDLE LOCALIZATION AND AXILLARY  SENTINEL LYMPH NODE BX; Right     Comment:  Procedure: BREAST LUMPECTOMY WITH SENTINEL LYMPH NODE               BX, WIRE LOCALIZATION;  Surgeon: Nicholaus Selinda Birmingham, MD;                Location: [facility];  Service: General;  Laterality:                Right; No date: bunionectomy; Left 10/12/2022: CATARACT EXTRACTION W/PHACO; Left     Comment:  Procedure: CATARACT EXTRACTION PHACO AND INTRAOCULAR               LENS PLACEMENT (IOC) LEFT 11.13 01:13.9;  Surgeon:               Mittie Gaskin, MD;  Location: [facility];              Service: Ophthalmology;  Laterality: Left; 10/26/2022: CATARACT EXTRACTION W/PHACO; Right     Comment:  Procedure: CATARACT EXTRACTION PHACO AND INTRAOCULAR               LENS PLACEMENT (IOC) RIGHT 12.71 01:14.8;  Surgeon:               Mittie Gaskin, MD;  Location: [facility];              Service: Ophthalmology;  Laterality: Right; 1984: CERVICAL CONE BIOPSY 01/23/2024: COLONOSCOPY WITH PROPOFOL ; N/A     Comment:  Procedure: COLONOSCOPY WITH PROPOFOL ;  Surgeon: Unk Corinn Skiff, MD;  Location: [facility];  Service: Endoscopy;  Laterality: N/A;  COLON SCOPE               REMOVED @ 0836 AND CHANGED OVER TO A PEDS SCOPE IN @ 0838              CECUM REACHED @ 0846 06/07/2018: DILATION AND CURETTAGE OF UTERUS No date: EYE SURGERY 2007: HIP PINNING 06/07/2018: HYSTEROSCOPY WITH D & C; N/A     Comment:  Procedure: DILATATION AND CURETTAGE               /HYSTEROSCOPY/MYOSURE POLYPECTOMY;  Surgeon: Lake Read, MD;  Location: [facility];  Service: Gynecology;                Laterality: N/A; 10/09/2024: IVC FILTER INSERTION; N/A     Comment:  Procedure: IVC FILTER INSERTION;  Surgeon: Jama Cordella MATSU, MD;  Location: [facility] INVASIVE [facility];  Service:              Cardiovascular;  Laterality: N/A; 2007: LEG SURGERY     Comment:  plate No date: TONSILLECTOMY No date: VAGINAL DELIVERY     Comment:  x2  BMI    Body Mass Index: 36.95 kg/m      Reproductive/Obstetrics negative OB ROS                              Anesthesia Physical Anesthesia Plan  ASA: 2  Anesthesia  Plan: Spinal   Post-op Pain Management: Ofirmev  IV (intra-op)* and Toradol IV (intra-op)*   Induction: Intravenous  PONV Risk Score and Plan: 2 and Propofol  infusion, TIVA and Treatment may vary due to age or medical condition  Airway Management Planned: Natural Airway and Nasal Cannula  Additional Equipment:   Intra-op Plan:   Post-operative Plan:   Informed Consent: I have reviewed the patients History and Physical, chart, labs and discussed the procedure including the risks, benefits and alternatives for the proposed anesthesia with the patient or authorized representative who has indicated his/her understanding and acceptance.     Dental Advisory Given  Plan Discussed with: Anesthesiologist, CRNA and Surgeon  Anesthesia Plan Comments: (Patient reports no bleeding problems and no anticoagulant use.  Plan for spinal with backup GA  Patient consented for risks of anesthesia including but not limited to:  - adverse reactions to medications - damage to eyes, teeth, lips or other oral mucosa - nerve damage due to positioning  - risk of bleeding, infection and or nerve damage from spinal that could lead to paralysis - risk of headache or failed spinal - damage to teeth, lips or other oral mucosa - sore throat or hoarseness - damage to heart, brain, nerves, lungs, other parts of body or loss of life  Patient voiced understanding and assent.)         Anesthesia Quick Evaluation

## 2024-10-11 NOTE — Anesthesia Procedure Notes (Signed)
 Spinal  Patient location during procedure: OR Start time: 10/11/2024 1:05 PM End time: 10/11/2024 1:10 PM Reason for block: surgical anesthesia Staffing Performed: resident/CRNA  Resident/CRNA: Trudy Rankin LABOR, CRNA Performed by: Trudy Rankin LABOR, CRNA Authorized by: Chesley Lendia CROME, MD   Preanesthetic Checklist Completed: patient identified, IV checked, site marked, risks and benefits discussed, surgical consent, monitors and equipment checked, pre-op evaluation and timeout performed Spinal Block Patient position: sitting Prep: ChloraPrep Patient monitoring: heart rate, continuous pulse ox and blood pressure Approach: midline Location: L4-5 Injection technique: single-shot Needle Needle type: Quincke  Needle gauge: 22 G Needle length: 10 cm Assessment Sensory level: T4 Events: CSF return Additional Notes First attempt 24ga pencan with introducer at L3-L4. Unsuccessful. 2nd attempt 22ga Quincke at L4-L5 = successful. +CSF flow with +swirl. Normal csf pressure. Pt tolerated well. Transient paresthesias on both L and R felt during procedure but resolved immediately with needle relocation.

## 2024-10-11 NOTE — Interval H&P Note (Signed)
 History and Physical Interval Note:  10/11/2024 11:04 AM  Stacy Warren  has presented today for surgery, with the diagnosis of Primary osteoarthritis of left knee.  The various methods of treatment have been discussed with the patient and family. After consideration of risks, benefits and other options for treatment, the patient has consented to  Procedure(s): ARTHROPLASTY, KNEE, TOTAL, USING IMAGELESS COMPUTER-ASSISTED NAVIGATION (Left) as a surgical intervention.  The patient's history has been reviewed, patient examined, no change in status, stable for surgery.  I have reviewed the patient's chart and labs.  Questions were answered to the patient's satisfaction.     Lavi Sheehan P Samyukta Cura

## 2024-10-11 NOTE — Op Note (Addendum)
 Preoperative diagnosis: Popliteal artery injury during knee replacement  Postoperative diagnosis: Same as above  Procedure: Repair of transected popliteal artery with interposition 6 mm Artegraft bypass from the proximal popliteal artery to the distal popliteal artery.  Called to the operating room during knee replacement. Patient had a very irregular and jagged bone edge and on release of the tourniquet by Dr. Mardee it was found to have brisk arterial bleeding. I joined the field and interrogated and it was clear that the popliteal artery had been severed.  There was a large lateral sidebranch proximally and then the popliteal artery.  The distal popliteal artery had retracted inferiorly.  With the retractors in place for the knee prosthesis placement, there was excellent exposure.  The popliteal artery would have been under tremendous tension to try to perform a primary repair however.  In discussions with Dr. Hooten, the need for increased mobility and some laxity in the area necessitated a repair with a bypass graft.  I elected to do a popliteal artery to popliteal artery bypass using Artegraft.  I felt this would be more hemostatic and infection resistant and Gore-Tex. Her artery was freshened up proximally and distally.  Vascular clamps were placed proximally and distally and a small dose of heparin was given.  We were off tourniquet for my portion of the procedure. A 6 mm Artegraft was brought on the field and prepared appropriately.  The proximal and distal popliteal artery were locally heparinized vigorously.  I performed the proximal anastomosis with an end to end fashion with two 6-0 Prolene running sutures to parachute down to avoid any pursestring of the anastomosis.  I started 1 suture posteriorly and 1 suture anteriorly.  There were now four 6-0 Prolene's and we ran approximately one quarter of the length of the anastomosis in all 4 quadrants with each suture.  On release of control there  was excellent pulsatile flow through the Artegraft.  The proximal sidebranch did have bleeding and this was then ligated with 5-0 Prolene sutures.  Attention was turned to the distal anastomosis at this point.  With the help of Dr. Mardee trying to determine how much mobility would be needed and how much laxity was needed in the area, the Artegraft was cut to an appropriate length to allow an anastomosis to the distal popliteal artery and allow some redundancy for mobility.  The 6 mm Artegraft was larger than the distal popliteal artery, but this could be managed with an end-to-end anastomosis nonetheless.  Again, two 6-0 Prolene sutures were started 1 posteriorly and 1 anteriorly.  We ran one quarter the length of the arteriotomy medial and laterally in each direction performing the anastomosis and quadrants.  The vessel was flushed and de-aired on release to control.  On release, there was excellent pulsatile flow through the Artegraft and the popliteal artery distal to the Artegraft had a good pulse. The wound was then irrigated.  All bleeding points were controlled and a couple of 6-0 Prolene patch sutures were used for hemostasis.  Fibrillar and Vistaseal were then brought onto the field and hemostasis was complete. I then turned the operative room back over to Dr. Mardee to complete the knee replacement.

## 2024-10-12 DIAGNOSIS — M1712 Unilateral primary osteoarthritis, left knee: Secondary | ICD-10-CM | POA: Diagnosis not present

## 2024-10-12 DIAGNOSIS — S85002A Unspecified injury of popliteal artery, left leg, initial encounter: Secondary | ICD-10-CM | POA: Diagnosis not present

## 2024-10-12 DIAGNOSIS — Z853 Personal history of malignant neoplasm of breast: Secondary | ICD-10-CM | POA: Diagnosis not present

## 2024-10-12 DIAGNOSIS — Z7982 Long term (current) use of aspirin: Secondary | ICD-10-CM | POA: Diagnosis not present

## 2024-10-12 DIAGNOSIS — N182 Chronic kidney disease, stage 2 (mild): Secondary | ICD-10-CM | POA: Diagnosis not present

## 2024-10-12 LAB — BPAM RBC
Blood Product Expiration Date: 202511102359
Blood Product Expiration Date: 202511102359
Blood Product Expiration Date: 202511112359
Blood Product Expiration Date: 202511112359
Unit Type and Rh: 7300
Unit Type and Rh: 7300
Unit Type and Rh: 7300
Unit Type and Rh: 7300

## 2024-10-12 LAB — TYPE AND SCREEN
ABO/RH(D): B POS
Antibody Screen: NEGATIVE
Unit division: 0
Unit division: 0
Unit division: 0
Unit division: 0

## 2024-10-12 LAB — PREPARE RBC (CROSSMATCH)

## 2024-10-12 LAB — SURGICAL PCR SCREEN
MRSA, PCR: NEGATIVE
Staphylococcus aureus: NEGATIVE

## 2024-10-12 MED ORDER — MUPIROCIN 2 % EX OINT
1.0000 | TOPICAL_OINTMENT | Freq: Two times a day (BID) | CUTANEOUS | Status: DC
  Administered 2024-10-12 – 2024-10-13 (×2): 1 via NASAL
  Filled 2024-10-12: qty 22

## 2024-10-12 NOTE — Care Management Obs Status (Signed)
 MEDICARE OBSERVATION STATUS NOTIFICATION   Patient Details  Name: CANDRA WEGNER MRN: [id number] Date of Birth: 12-Jun-1951   Medicare Observation Status Notification Given:  Yes    Rojelio SHAUNNA Rattler 10/12/2024, 1:15 PM

## 2024-10-12 NOTE — Evaluation (Signed)
 Physical Therapy Evaluation Patient Details Name: Stacy Warren MRN: [id number] DOB: [birth year] Today's Date: 10/12/2024  History of Present Illness  Pt is a 73 y.o. 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 PMH: PE, CKD, OA  Clinical Impression  Pt received in supine and is agreeable for PT eval. At baseline, pt is IND with ADL's, IADL's, ambulation without AD, driving, and medication management. 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   Pt presents with functional weakness (LLE>RLE), decreased LLE AROM, increased pain levels, decreased gross balance, decreased activity tolerance, and impaired sensation to L heel resulting in impaired functional mobility from baseline. Due to deficits, pt required CGA for bed mobility, minA +2 - modA +2 for transfers with RW, and minA+2 for limited ambulation in RW EOB>BSC>recliner. Pt currently requiring increased assist and multimodal cues for safety, sequencing, and RW management secondary to weakness, decreased, balance, and gait deficits (see ambulation below for details). Deficits currently increase the pt's risk of falls and injury.   Deficits limit the pt's ability to safely and independently perform ADL's, transfer, and ambulate. Pt will benefit from acute skilled PT services to address deficits for return to baseline function. Pt will benefit from post acute therapy services to address deficits for return to baseline function.  Encourage OOB mobility with nursing and mobility tech for meals and toileting for continued progress towards goals and maintenance of IND with functional mobility while hospitalized.         If plan is discharge home, recommend the following: A lot of help with walking and/or transfers;A little help with  bathing/dressing/bathroom;Assistance with cooking/housework;Assist for transportation;Help with stairs or ramp for entrance   Can travel by private vehicle   Yes    Equipment Recommendations None recommended by PT     Functional Status Assessment Patient has had a recent decline in their functional status and demonstrates the ability to make significant improvements in function in a reasonable and predictable amount of time.     Precautions / Restrictions Precautions Precautions: Fall Recall of Precautions/Restrictions: Intact Precaution/Restrictions Comments: hemovac Restrictions Weight Bearing Restrictions Per Provider Order: Yes LLE Weight Bearing Per Provider Order: Weight bearing as tolerated Other Position/Activity Restrictions: zero bone foam, no pillow under knee      Mobility  Bed Mobility Overal bed mobility: Needs Assistance Bed Mobility: Supine to Sit     Supine to sit: Contact guard, Supervision     General bed mobility comments: bed placed in supine position to mimic home environment, exited L side of bed with CGA/SBA no physical assist provided, UE support, cues for technique, increased time/effort and reliance on momentum for facilitation; CGA to forward scoot    Transfers Overall transfer level: Needs assistance Equipment used: Rolling walker (2 wheels) Transfers: Sit to/from Stand, Bed to chair/wheelchair/BSC Sit to Stand: From elevated surface, Min assist, Mod assist, +2 physical assistance   Step pivot transfers: Mod assist, +2 physical assistance, From elevated surface       General transfer comment: min a x2 to stand from elevated bed height via rocking technique for momentum, cues for hand placement; Mod A x2 for safety and RW management, sequencing for step pivots to [facility] and recliner during session with significant effort and hyperextension of LLE, Mod A x2 to stand from [facility]  and likely more from low recliner. multimodal cues for safety, sequencing,  hand placement, and RW proximity.    Ambulation/Gait Ambulation/Gait assistance: Min assist, +2 physical assistance Gait Distance (Feet): 5 Feet (86ft x1 (EOB>BSC); 4ft x1 (BSC>recliner))           General Gait Details: minA +2 for balance, RW management, and cues to safely ambulate short distance in room with RW. Limited tolerance for weight shift to affected side requiring assistance. Difficulty maintaining LLE under BOS, limited LLE step length/foot clearance, increased L genu recurvatum with weight acceptance, forward flexed posture, decreased RW proximity, increased UE WB through RW. Max multimodal cues for safety, sequencing, step length, BOS, and RW proximity.     Balance Overall balance assessment: Needs assistance Sitting-balance support: Feet supported Sitting balance-Leahy Scale: Fair Sitting balance - Comments: CGA to maintain balance on elevated bed during LB dressing   Standing balance support: Reliant on assistive device for balance, Bilateral upper extremity supported, During functional activity Standing balance-Leahy Scale: Poor Standing balance comment: Min/Mod A x2 with RW and heavy UE reliance during standing and transfers                             Pertinent Vitals/Pain Pain Assessment Pain Assessment: Faces Faces Pain Scale: Hurts little more Pain Location: L thigh/femur Pain Intervention(s): Monitored during session, Premedicated before session, Repositioned, Relaxation, Ice applied    Home Living Family/patient expects to be discharged to:: Private residence Living Arrangements: Children;Spouse/significant other (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; spouse unable to asssist.) Available Help at Discharge: Available PRN/intermittently (2 sons work during the day and are only able to provide PRN care; has friends who may be able to provide PRN care) Type of Home: House Home Access: Stairs to enter Entrance Stairs-Rails: Right Entrance  Stairs-Number of Steps: 2 on the decl and 3 in garage; son ordered a ramp; 1 rail on R side on both steps   Home Layout: One level;Able to live on main level with bedroom/bathroom Home Equipment: Shower seat;BSC/3in1;Rolling Walker (2 wheels);Cane - single point;Rollator (4 wheels);Lift chair      Prior Function Prior Level of Function : Driving;Independent/Modified Independent             Mobility Comments: IND, No AD use, walking community and household distances; 1 fall in Aug 2024 slipped on plastic at [employer] ADLs Comments: IND, driving, grocery shopping     Extremity/Trunk Assessment   Upper Extremity Assessment Upper Extremity Assessment: Defer to OT evaluation    Lower Extremity Assessment Lower Extremity Assessment: Generalized weakness (functional weakness present as she requires assist for transfers and gait in RW) LLE Deficits / Details: weakness secondary to acuity of sx, increased genu recurvatum in standing. endorses feeling weird sensation to L heel, otherwise sensation grossly intact. limited knee/hip AROM secondary to acuity of sx and weakness       Communication   Communication Communication: No apparent difficulties    Cognition Arousal: Alert Behavior During Therapy: WFL for tasks assessed/performed                             Following commands: Intact       Cueing Cueing Techniques: Verbal cues     General Comments General comments (skin integrity, edema, etc.): LLE wrapped in ACE bandage with polar ice care under bandage; R IVC filter incision healing appropriately. Pt with  c/o mild dizziness with initial sup>sit transfer that subsided with time.    Exercises Other Exercises Other Exercises: Pt educated re: PT role/POC, DC recommendations, current level of needs and required care at home, safety with transfers/gait after surgery in RW, proper positioning, LLE WB precautions, OOB to recliner, call for help, OOB for toileting.    Assessment/Plan    PT Assessment Patient needs continued PT services  PT Problem List Decreased strength;Decreased range of motion;Decreased activity tolerance;Decreased balance;Decreased mobility;Impaired sensation;Obesity;Pain       PT Treatment Interventions DME instruction;Gait training;Stair training;Functional mobility training;Therapeutic activities;Therapeutic exercise;Balance training;Neuromuscular re-education;Patient/family education    PT Goals (Current goals can be found in the Care Plan section)  Acute Rehab PT Goals Patient Stated Goal: go home PT Goal Formulation: With patient Time For Goal Achievement: 10/26/24 Potential to Achieve Goals: Fair    Frequency Min 2X/week     Co-evaluation PT/OT/SLP Co-Evaluation/Treatment: Yes Reason for Co-Treatment: For patient/therapist safety;To address functional/ADL transfers PT goals addressed during session: Mobility/safety with mobility OT goals addressed during session: ADL's and self-care       AM-PAC PT 6 Clicks Mobility  Outcome Measure Help needed turning from your back to your side while in a flat bed without using bedrails?: A Little Help needed moving from lying on your back to sitting on the side of a flat bed without using bedrails?: A Little Help needed moving to and from a bed to a chair (including a wheelchair)?: A Lot Help needed standing up from a chair using your arms (e.g., wheelchair or bedside chair)?: A Lot Help needed to walk in hospital room?: A Little Help needed climbing 3-5 steps with a railing? : A Lot 6 Click Score: 15    End of Session Equipment Utilized During Treatment: Gait belt Activity Tolerance: Patient tolerated treatment well Patient left: in chair;with call bell/phone within reach;with chair alarm set (towel roll under L heel) Nurse Communication: Mobility status PT Visit Diagnosis: Unsteadiness on feet (R26.81);Muscle weakness (generalized) (M62.81);History of falling  (Z91.81);Difficulty in walking, not elsewhere classified (R26.2);Pain Pain - Right/Left: Left Pain - part of body: Leg    Time: 9055-8971 PT Time Calculation (min) (ACUTE ONLY): 44 min   Charges:   PT Evaluation $PT Eval Moderate Complexity: 1 Mod PT Treatments $Therapeutic Activity: 8-22 mins PT General Charges $$ ACUTE PT VISIT: 1 Visit        Camie CHARLENA Kluver, PT, DPT 11:10 AM,10/12/24 Physical Therapist - [facility]

## 2024-10-12 NOTE — Progress Notes (Addendum)
 Unable to palpate distal pulses on left foot. Capillary refill less than 3 sec, extremity cool to touch and dry- baseline since admission and no different than right foot. Pt able to move her foot and toes, sensation decreased since admission due to spinal anesthesia.  Dorsalis pedis pulse heard through doppler.  Esco, MD notified, she will be here to assess pt.

## 2024-10-12 NOTE — Progress Notes (Signed)
 Subjective: 1 Day Post-Op Procedure(s) (LRB): ARTHROPLASTY, KNEE, TOTAL, USING IMAGELESS COMPUTER-ASSISTED NAVIGATION AND REPAIR OF POPLITEAL ARTERY -- POPLITEAL ARTERY BYPASS WITH GRAFT (Left) Patient reports pain as mild.   Patient seen in rounds with Dr. Mardee. Patient is well, and has had no acute complaints or problems Denies any CP, SOB, N/V, fevers or chills We will start therapy today.  Plan is to go Home after hospital stay.  Objective: Vital signs in last 24 hours: Temp:  [96.8 F (36 C)-98.7 F (37.1 C)] 98.7 F (37.1 C) (10/18 0829) Pulse Rate:  [64-89] 83 (10/18 0829) Resp:  [0-23] 16 (10/18 0829) BP: (76-199)/(50-179) 113/60 (10/18 0829) SpO2:  [91 %-98 %] 94 % (10/18 0829) Weight:  [91.6 kg] 91.6 kg (10/17 1005)  Intake/Output from previous day:  Intake/Output Summary (Last 24 hours) at 10/12/2024 0924 Last data filed at 10/12/2024 0554 Gross per 24 hour  Intake 3235.13 ml  Output 1680 ml  Net 1555.13 ml    Intake/Output this shift: No intake/output data recorded.  Labs: Recent Labs    10/11/24 2116  HGB 11.2*   Recent Labs    10/11/24 2116  HCT 34.7*   Recent Labs    10/09/24 1237  BUN 16  CREATININE 0.96   No results for input(s): LABPT, INR in the last 72 hours.  EXAM General - Patient is Alert, Appropriate, and Oriented Extremity - Neurologically intact Neurovascular intact Sensation intact distally Intact pulses distally Dorsiflexion/Plantar flexion intact No cellulitis present Compartment soft Dressing - dressing C/D/I and no drainage Motor Function - intact, moving foot and toes well on exam. JP Drain remains in place with pseudo jones dressing applied  Past Medical History:  Diagnosis Date   Aromatase inhibitor use 06/11/2020   Arthritis    Breast cancer (HCC)    Cancer (HCC)    Glaucoma    Motion sickness    back  seat - cars   Osteopenia 11/28/2018   Pulmonary embolism (HCC) 04/09/2018   Completed course of  blood thinner in 2007; after femur fracture and surgery    Assessment/Plan: 1 Day Post-Op Procedure(s) (LRB): ARTHROPLASTY, KNEE, TOTAL, USING IMAGELESS COMPUTER-ASSISTED NAVIGATION AND REPAIR OF POPLITEAL ARTERY -- POPLITEAL ARTERY BYPASS WITH GRAFT (Left) Principal Problem:   History of total knee arthroplasty, left  Estimated body mass index is 36.95 kg/m as calculated from the following:   Height as of this encounter: 5' 2 (1.575 m).   Weight as of this encounter: 91.6 kg. Advance diet Up with therapy  Patient will continue to work with physical therapy to pass postoperative PT protocols, ROM and strengthening  Discussed with the patient continuing to utilize Polar Care  Patient will use bone foam in 20-30 minute intervals  Patient will wear TED hose bilaterally to help prevent DVT and clot formation  Discussed the Aquacel bandage.  This bandage will stay in place 7 days postoperatively.  Can be replaced with honeycomb bandages that will be sent home with the patient  Discussed sending the patient home with tramadol and oxycodone  for as needed pain management.  Patient will also be sent home with Celebrex to help with swelling and inflammation.  Patient will take an 81 mg aspirin twice daily for DVT prophylaxis  JP drain remains in place  Weight-Bearing as tolerated to left leg  Plan to stay one more night for vascular evaluation, Hgb at 11.2  Patient will follow-up with [facility] clinic orthopedics in 2 weeks for staple removal and reevaluation  Fonda Koyanagi, PA-C [facility] 10/12/2024, 9:24 AM

## 2024-10-12 NOTE — Progress Notes (Signed)
 1 Day Post-Op   Subjective/Chief Complaint: Patient states mild discomfort in LEFT lower extremity. Controlled with current regimen. Hungry. Otherwise without complaint. Anxious to go home.   Objective: Vital signs in last 24 hours: Temp:  [96.8 F (36 C)-98.7 F (37.1 C)] 98.7 F (37.1 C) (10/18 0829) Pulse Rate:  [64-89] 83 (10/18 0829) Resp:  [0-23] 16 (10/18 0829) BP: (76-199)/(50-179) 113/60 (10/18 0829) SpO2:  [91 %-98 %] 94 % (10/18 0829) Weight:  [91.6 kg] 91.6 kg (10/17 1005) Last BM Date : 10/11/24  Intake/Output from previous day: 10/17 0701 - 10/18 0700 In: 3235.1 [I.V.:2835.1; IV Piggyback:400] Out: 1680 [Urine:950; Drains:230; Blood:500] Intake/Output this shift: No intake/output data recorded.  General appearance: alert and no distress Cardio: regular rate and rhythm Extremities: LEFT lower extremity- leg dressing in place.Foot slightly cool- equal bilaterally, +DP/PT, +motor/+Sensory  Lab Results:  Recent Labs    10/11/24 2116  HGB 11.2*  HCT 34.7*   BMET Recent Labs    10/09/24 1237  BUN 16  CREATININE 0.96   PT/INR No results for input(s): LABPROT, INR in the last 72 hours. ABG No results for input(s): PHART, HCO3 in the last 72 hours.  Invalid input(s): PCO2, PO2  Studies/Results: DG Knee Left Port Result Date: 10/11/2024 CLINICAL DATA:  Left knee arthroplasty EXAM: PORTABLE LEFT KNEE - 1-2 VIEW COMPARISON:  None Available. FINDINGS: Frontal and cross-table lateral views of the left knee are obtained. Three component left knee arthroplasty is identified in the expected position, without evidence of acute complication. Postsurgical changes are seen within the anterior soft tissues, with surgical drains in place. IMPRESSION: 1. Unremarkable left knee arthroplasty. Electronically Signed   By: Ozell Daring M.D.   On: 10/11/2024 20:47    Anti-infectives: Anti-infectives (From admission, onward)    Start     Dose/Rate Route  Frequency Ordered Stop   10/11/24 2300  ceFAZolin  (ANCEF ) IVPB 2g/100 mL premix        2 g 200 mL/hr over 30 Minutes Intravenous Every 6 hours 10/11/24 2115 10/12/24 0624   10/11/24 1000  ceFAZolin  (ANCEF ) IVPB 2g/100 mL premix        2 g 200 mL/hr over 30 Minutes Intravenous On call to O.R. 10/11/24 0948 10/11/24 1706       Assessment/Plan: s/p Procedure(s): ARTHROPLASTY, KNEE, TOTAL, USING IMAGELESS COMPUTER-ASSISTED NAVIGATION AND REPAIR OF POPLITEAL ARTERY -- POPLITEAL ARTERY BYPASS WITH GRAFT (Left) POD #1  Re-marked and showed nursing staff DP pulses. Ok for PT- OOB Will monitor pulses Home tomorrow afternoon  LOS: 0 days    Tisa Dakin A 10/12/2024

## 2024-10-12 NOTE — Evaluation (Signed)
 Occupational Therapy Evaluation Patient Details Name: EVOLEHT HOVATTER MRN: [id number] DOB: 1951-03-07 Today's Date: 10/12/2024   History of Present Illness   Pt is a 73 y.o. 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 PMH: PE, CKD, OA     Clinical Impressions Pt was seen for OT evaluation this date. PTA, pt resides in a 2 level home with 2-3 STE with R hand rail with her husband who has dementia. She assists in his care/supervision. At baseline, she is IND with mobility and ADL/IADLs without AD use. 1 fall in the last 6 months when she slipped on plastic at a grocery store.  Pt presents with deficits in strength, balance, pain management and activity tolerance, affecting safe and optimal ADL completion. Pt currently requires CGA for safety with bed mobility tasks and forward scooting reliant heavily on BUEs and momentum. She required Min A x2 to stand from elevated bed height to RW and Mod A x2 to safely perform step pivots/a few effortful steps using RW to get to [facility] and recliner during session with increased time, effort, heavy UE reliance, cues for RW management and use and difficulty with LLE management/sequencing/technique. Able to perform standing peri-care with Min/CGA x2 to maintain standing balance. She required Max A/total assist to don L sock and Mod A to don R sock with pain in L thigh/femur with bending motion. Edu provided on precautions, polar care use, and safety and task modification during ADL performance. Pt is far from her baseline function and will benefit from skilled OT services to address noted impairments and functional limitations to promote return to PLOF. Do anticipate the need for follow up OT services upon acute hospital DC. Recommend max HH services if pt opts to return home at this time, but reports she has DME at home.        If plan is discharge home, recommend the following:   A lot of help with bathing/dressing/bathroom;Two people to help with walking and/or transfers;Assistance with cooking/housework;Help with stairs or ramp for entrance;Assist for transportation     Functional Status Assessment   Patient has had a recent decline in their functional status and demonstrates the ability to make significant improvements in function in a reasonable and predictable amount of time.     Equipment Recommendations   Other (comment) (has needed DME)     Recommendations for Other Services         Precautions/Restrictions   Precautions Precautions: Fall Recall of Precautions/Restrictions: Intact Precaution/Restrictions Comments: hemovac Restrictions Weight Bearing Restrictions Per Provider Order: Yes LLE Weight Bearing Per Provider Order: Weight bearing as tolerated     Mobility Bed Mobility Overal bed mobility: Needs Assistance Bed Mobility: Supine to Sit     Supine to sit: Contact guard, Supervision     General bed mobility comments: bed placed in supine position to mimic home environment, exited L side of bed with CGA/SBA no physical assist provided, cues for technique; CGA to forward scoot    Transfers Overall transfer level: Needs assistance Equipment used: Rolling walker (2 wheels) Transfers: Sit to/from Stand, Bed to chair/wheelchair/BSC Sit to Stand: From elevated surface, Min assist, Mod assist, +2 physical assistance     Step pivot transfers: Mod assist, +2 physical assistance, From elevated surface     General transfer comment: min a x2 to stand from elevated bed height via  rocking technique for momentum, cues for hand placement; Mod A x2 for safety and RW management, sequencing for step pivots to [facility] and recliner during session with significant effort and hyperextension of LLE, Mod A x2 to stand from [facility] and likely more from low recliner      Balance Overall balance  assessment: Needs assistance Sitting-balance support: Feet supported Sitting balance-Leahy Scale: Fair Sitting balance - Comments: CGA to maintain balance on elevated bed during LB dressing   Standing balance support: Reliant on assistive device for balance, Bilateral upper extremity supported, During functional activity Standing balance-Leahy Scale: Poor Standing balance comment: Min/Mod A x2 with RW and heavy UE reliance during standing and transfers                           ADL either performed or assessed with clinical judgement   ADL Overall ADL's : Needs assistance/impaired                     Lower Body Dressing: Moderate assistance;Sitting/lateral leans Lower Body Dressing Details (indicate cue type and reason): to donn bil socks Toilet Transfer: Rolling walker (2 wheels);Cueing for safety;Cueing for sequencing;Moderate assistance;BSC/3in1;+2 for physical assistance Toilet Transfer Details (indicate cue type and reason): step pivot to [facility] and BSC to chair using RW, cues for safety/sequencing, RW management Toileting- Clothing Manipulation and Hygiene: Contact guard assist;+2 for safety/equipment;Sit to/from stand Toileting - Clothing Manipulation Details (indicate cue type and reason): standing peri-care with CGA x2 from therapists to maintain balance             Vision         Perception         Praxis         Pertinent Vitals/Pain Pain Assessment Pain Assessment: Faces Faces Pain Scale: Hurts little more Pain Location: L thigh/femur with bending Pain Descriptors / Indicators: Grimacing Pain Intervention(s): Monitored during session, Limited activity within patient's tolerance, Repositioned, Ice applied     Extremity/Trunk Assessment Upper Extremity Assessment Upper Extremity Assessment: Defer to OT evaluation   Lower Extremity Assessment Lower Extremity Assessment: Generalized weakness (functional weakness present as she requires  assist for transfers and gait in RW) LLE Deficits / Details: weakness secondary to acuity of sx, increased genu recurvatum in standing. endorses feeling weird sensation to L heel, otherwise sensation grossly intact. limited knee/hip AROM secondary to acuity of sx and weakness       Communication Communication Communication: No apparent difficulties   Cognition Arousal: Alert Behavior During Therapy: WFL for tasks assessed/performed Cognition: No apparent impairments                               Following commands: Intact       Cueing  General Comments   Cueing Techniques: Verbal cues  LLE wrapped in ACE bandage with polar ice care under bandage; R IVC filter incision healing appropriately. Pt with c/o mild dizziness with initial sup>sit transfer that subsided with time.   Exercises Other Exercises Other Exercises: Edu on role of OT in acute setting, polar care use, weight bearing precautions, and DC recommendations, as well as AE/AD/DME use.   Shoulder Instructions      Home Living Family/patient expects to be discharged to:: Private residence Living Arrangements: Children;Spouse/significant other (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; spouse unable to asssist.) Available Help at Discharge: Available PRN/intermittently (2 sons  work during the day and are only able to provide PRN care; has friends who may be able to provide PRN care) Type of Home: House Home Access: Stairs to enter [employer] of Steps: 2 on the decl and 3 in garage; son ordered a ramp; 1 rail on R side on both steps Entrance Stairs-Rails: Right Home Layout: One level;Able to live on main level with bedroom/bathroom     Bathroom Shower/Tub: Arts development officer Toilet: Handicapped height     Home Equipment: Control and instrumentation engineer (2 wheels);Cane - single point;Rollator (4 wheels);Lift chair          Prior Functioning/Environment Prior Level of  Function : Driving;Independent/Modified Independent             Mobility Comments: IND, No AD use, walking community and household distances; 1 fall in Aug 2024 slipped on plastic at [employer] ADLs Comments: IND, driving, grocery shopping    OT Problem List: Decreased strength;Decreased activity tolerance;Impaired balance (sitting and/or standing);Pain   OT Treatment/Interventions: Self-care/ADL training;Therapeutic exercise;Therapeutic activities;Patient/family education;DME and/or AE instruction;Balance training      OT Goals(Current goals can be found in the care plan section)   Acute Rehab OT Goals Patient Stated Goal: get stronger to go home OT Goal Formulation: With patient Time For Goal Achievement: 10/26/24 ADL Goals Pt Will Perform Upper Body Bathing: with set-up;sitting Pt Will Perform Lower Body Bathing: with supervision;sit to/from stand;sitting/lateral leans Pt Will Transfer to Toilet: with supervision;ambulating;bedside commode   OT Frequency:  Min 3X/week    Co-evaluation PT/OT/SLP Co-Evaluation/Treatment: Yes Reason for Co-Treatment: For patient/therapist safety;To address functional/ADL transfers PT goals addressed during session: Mobility/safety with mobility OT goals addressed during session: ADL's and self-care      AM-PAC OT 6 Clicks Daily Activity     Outcome Measure Help from another person eating meals?: None Help from another person taking care of personal grooming?: A Little Help from another person toileting, which includes using toliet, bedpan, or urinal?: A Lot Help from another person bathing (including washing, rinsing, drying)?: A Lot Help from another person to put on and taking off regular upper body clothing?: A Little Help from another person to put on and taking off regular lower body clothing?: A Lot 6 Click Score: 16   End of Session Equipment Utilized During Treatment: Gait belt;Rolling walker (2 wheels) Nurse Communication:  Mobility status  Activity Tolerance: Patient tolerated treatment well Patient left: in chair;with call bell/phone within reach;with chair alarm set  OT Visit Diagnosis: Other abnormalities of gait and mobility (R26.89);Muscle weakness (generalized) (M62.81);Pain Pain - Right/Left: Left Pain - part of body: Hip;Leg                Time: 9055-8971 OT Time Calculation (min): 44 min Charges:  OT General Charges $OT Visit: 1 Visit OT Evaluation $OT Eval Moderate Complexity: 1 Mod OT Treatments $Self Care/Home Management : 8-22 mins  Alta Goding, OTR/L 10/12/24, 11:55 AM   Macel Yearsley E Shaquan Puerta 10/12/2024, 11:50 AM

## 2024-10-13 ENCOUNTER — Other Ambulatory Visit: Payer: Self-pay

## 2024-10-13 DIAGNOSIS — M1712 Unilateral primary osteoarthritis, left knee: Secondary | ICD-10-CM | POA: Diagnosis not present

## 2024-10-13 MED ORDER — ASPIRIN 81 MG PO CHEW
81.0000 mg | CHEWABLE_TABLET | Freq: Two times a day (BID) | ORAL | Status: AC
Start: 2024-10-13 — End: ?

## 2024-10-13 MED ORDER — CELECOXIB 200 MG PO CAPS
200.0000 mg | ORAL_CAPSULE | Freq: Two times a day (BID) | ORAL | 1 refills | Status: AC
  Filled 2024-10-13: qty 60, 30d supply, fill #0

## 2024-10-13 MED ORDER — TRAMADOL HCL 50 MG PO TABS
50.0000 mg | ORAL_TABLET | ORAL | 0 refills | Status: AC | PRN
  Filled 2024-10-13: qty 30, 5d supply, fill #0

## 2024-10-13 MED ORDER — OXYCODONE HCL 5 MG PO TABS
5.0000 mg | ORAL_TABLET | ORAL | 0 refills | Status: AC | PRN
  Filled 2024-10-13: qty 30, 5d supply, fill #0

## 2024-10-13 MED ORDER — MUPIROCIN 2 % EX OINT
1.0000 | TOPICAL_OINTMENT | Freq: Two times a day (BID) | CUTANEOUS | 0 refills | Status: AC
  Filled 2024-10-13: qty 66, 49d supply, fill #0

## 2024-10-13 MED ORDER — CHLORHEXIDINE GLUCONATE 4 % EX SOLN
1.0000 | CUTANEOUS | 1 refills | Status: AC
  Filled 2024-10-13: qty 472, 25d supply, fill #0

## 2024-10-13 NOTE — Progress Notes (Addendum)
 Subjective: 2 Days Post-Op Procedure(s) (LRB): ARTHROPLASTY, KNEE, TOTAL, USING IMAGELESS COMPUTER-ASSISTED NAVIGATION AND REPAIR OF POPLITEAL ARTERY -- POPLITEAL ARTERY BYPASS WITH GRAFT (Left) Patient reports pain as mild.   Patient seen in rounds with Dr. Mardee. Patient is well, and has had no acute complaints or problems Denies any CP, SOB, N/V, fevers or chills We will start therapy today.  Plan is to go Home after hospital stay.  Objective: Vital signs in last 24 hours: Temp:  [98.1 F (36.7 C)-98.8 F (37.1 C)] 98.8 F (37.1 C) (10/19 0811) Pulse Rate:  [81-92] 92 (10/19 0811) Resp:  [15-18] 17 (10/19 0811) BP: (97-115)/(52-73) 111/72 (10/19 0811) SpO2:  [93 %-98 %] 98 % (10/19 0811)  Intake/Output from previous day:  Intake/Output Summary (Last 24 hours) at 10/13/2024 1319 Last data filed at 10/13/2024 0900 Gross per 24 hour  Intake 1389.78 ml  Output 450 ml  Net 939.78 ml    Intake/Output this shift: Total I/O In: 240 [P.O.:240] Out: 50 [Drains:50]  Labs: Recent Labs    10/11/24 2116  HGB 11.2*   Recent Labs    10/11/24 2116  HCT 34.7*   No results for input(s): NA, K, CL, CO2, BUN, CREATININE, GLUCOSE, CALCIUM in the last 72 hours.  No results for input(s): LABPT, INR in the last 72 hours.  EXAM General - Patient is Alert, Appropriate, and Oriented Extremity - Neurologically intact Neurovascular intact Sensation intact distally Intact pulses distally Dorsiflexion/Plantar flexion intact No cellulitis present Compartment soft Dressing - dressing C/D/I and no drainage Motor Function - intact, moving foot and toes well on exam. JP Drain removed intact without difficulty  Past Medical History:  Diagnosis Date   Aromatase inhibitor use 06/11/2020   Arthritis    Breast cancer (HCC)    Cancer (HCC)    Glaucoma    Motion sickness    back  seat - cars   Osteopenia 11/28/2018   Pulmonary embolism (HCC) 04/09/2018    Completed course of blood thinner in 2007; after femur fracture and surgery    Assessment/Plan: 2 Days Post-Op Procedure(s) (LRB): ARTHROPLASTY, KNEE, TOTAL, USING IMAGELESS COMPUTER-ASSISTED NAVIGATION AND REPAIR OF POPLITEAL ARTERY -- POPLITEAL ARTERY BYPASS WITH GRAFT (Left) Principal Problem:   History of total knee arthroplasty, left  Estimated body mass index is 36.95 kg/m as calculated from the following:   Height as of this encounter: 5' 2 (1.575 m).   Weight as of this encounter: 91.6 kg. Advance diet Up with therapy  Patient will continue to work with physical therapy to pass postoperative PT protocols, ROM and strengthening  Discussed with the patient continuing to utilize Polar Care  Patient will use bone foam in 20-30 minute intervals  Patient will wear TED hose bilaterally to help prevent DVT and clot formation  Discussed the Aquacel bandage.  This bandage will stay in place 7 days postoperatively.  Can be replaced with honeycomb bandages that will be sent home with the patient  Discussed sending the patient home with tramadol and oxycodone  for as needed pain management.  Patient will also be sent home with Celebrex to help with swelling and inflammation.  Patient will take an 81 mg aspirin twice daily for DVT prophylaxis  JP drain removed without any issue, intact  Weight-Bearing as tolerated to left leg  Stable per vascular for discharge home  Patient will follow-up with [facility] clinic orthopedics in 2 weeks for staple removal and reevaluation  Fonda Koyanagi, PA-C [facility] 10/13/2024, 1:19 PM

## 2024-10-13 NOTE — Plan of Care (Signed)
 Problem: Education: Goal: Knowledge of General Education information will improve Description: Including pain rating scale, medication(s)/side effects and non-pharmacologic comfort measures 10/13/2024 1318 by Joshua Andrez PARAS, LPN Outcome: Adequate for Discharge 10/13/2024 1318 by Joshua Andrez PARAS, LPN Outcome: Adequate for Discharge 10/13/2024 1103 by Joshua Andrez PARAS, LPN Outcome: Progressing   Problem: Health Behavior/Discharge Planning: Goal: Ability to manage health-related needs will improve 10/13/2024 1318 by Joshua Andrez PARAS, LPN Outcome: Adequate for Discharge 10/13/2024 1318 by Joshua Andrez PARAS, LPN Outcome: Adequate for Discharge 10/13/2024 1103 by Joshua Andrez PARAS, LPN Outcome: Progressing   Problem: Clinical Measurements: Goal: Ability to maintain clinical measurements within normal limits will improve 10/13/2024 1318 by Joshua Andrez PARAS, LPN Outcome: Adequate for Discharge 10/13/2024 1318 by Joshua Andrez PARAS, LPN Outcome: Adequate for Discharge 10/13/2024 1103 by Joshua Andrez PARAS, LPN Outcome: Progressing Goal: Will remain free from infection 10/13/2024 1318 by Joshua Andrez PARAS, LPN Outcome: Adequate for Discharge 10/13/2024 1318 by Joshua Andrez PARAS, LPN Outcome: Adequate for Discharge 10/13/2024 1103 by Joshua Andrez PARAS, LPN Outcome: Progressing Goal: Diagnostic test results will improve 10/13/2024 1318 by Joshua Andrez PARAS, LPN Outcome: Adequate for Discharge 10/13/2024 1318 by Joshua Andrez PARAS, LPN Outcome: Adequate for Discharge 10/13/2024 1103 by Joshua Andrez PARAS, LPN Outcome: Progressing Goal: Respiratory complications will improve 10/13/2024 1318 by Joshua Andrez PARAS, LPN Outcome: Adequate for Discharge 10/13/2024 1318 by Joshua Andrez PARAS, LPN Outcome: Adequate for Discharge 10/13/2024 1103 by Joshua Andrez PARAS, LPN Outcome: Progressing Goal: Cardiovascular complication will be avoided 10/13/2024 1318 by Joshua Andrez PARAS, LPN Outcome: Adequate for Discharge 10/13/2024 1318 by Joshua Andrez PARAS, LPN Outcome: Adequate for Discharge 10/13/2024 1103 by Joshua Andrez PARAS, LPN Outcome: Progressing   Problem: Activity: Goal: Risk for activity intolerance will decrease 10/13/2024 1318 by Joshua Andrez PARAS, LPN Outcome: Adequate for Discharge 10/13/2024 1318 by Joshua Andrez PARAS, LPN Outcome: Adequate for Discharge 10/13/2024 1103 by Joshua Andrez PARAS, LPN Outcome: Progressing   Problem: Nutrition: Goal: Adequate nutrition will be maintained 10/13/2024 1318 by Joshua Andrez PARAS, LPN Outcome: Adequate for Discharge 10/13/2024 1318 by Joshua Andrez PARAS, LPN Outcome: Adequate for Discharge 10/13/2024 1103 by Joshua Andrez PARAS, LPN Outcome: Progressing   Problem: Coping: Goal: Level of anxiety will decrease 10/13/2024 1318 by Joshua Andrez PARAS, LPN Outcome: Adequate for Discharge 10/13/2024 1318 by Joshua Andrez PARAS, LPN Outcome: Adequate for Discharge 10/13/2024 1103 by Joshua Andrez PARAS, LPN Outcome: Progressing   Problem: Elimination: Goal: Will not experience complications related to bowel motility 10/13/2024 1318 by Joshua Andrez PARAS, LPN Outcome: Adequate for Discharge 10/13/2024 1318 by Joshua Andrez PARAS, LPN Outcome: Adequate for Discharge Goal: Will not experience complications related to urinary retention 10/13/2024 1318 by Joshua Andrez PARAS, LPN Outcome: Adequate for Discharge 10/13/2024 1318 by Joshua Andrez PARAS, LPN Outcome: Adequate for Discharge   Problem: Pain Managment: Goal: General experience of comfort will improve and/or be controlled 10/13/2024 1318 by Joshua Andrez PARAS, LPN Outcome: Adequate for Discharge 10/13/2024 1318 by Joshua Andrez PARAS, LPN Outcome: Adequate for Discharge   Problem: Safety: Goal: Ability to remain free from injury will improve 10/13/2024 1318 by Joshua Andrez PARAS, LPN Outcome: Adequate for Discharge 10/13/2024 1318 by Joshua Andrez PARAS, LPN Outcome: Adequate for Discharge   Problem: Skin Integrity: Goal: Risk for impaired skin integrity will  decrease 10/13/2024 1318 by Joshua Andrez PARAS, LPN Outcome: Adequate for Discharge 10/13/2024 1318 by Joshua Andrez PARAS, LPN Outcome: Adequate for Discharge   Problem: Education: Goal: Knowledge of the prescribed therapeutic  regimen will improve 10/13/2024 1318 by Joshua Andrez PARAS, LPN Outcome: Adequate for Discharge 10/13/2024 1318 by Joshua Andrez PARAS, LPN Outcome: Adequate for Discharge Goal: Individualized Educational Video(s) 10/13/2024 1318 by Joshua Andrez PARAS, LPN Outcome: Adequate for Discharge 10/13/2024 1318 by Joshua Andrez PARAS, LPN Outcome: Adequate for Discharge   Problem: Activity: Goal: Ability to avoid complications of mobility impairment will improve 10/13/2024 1318 by Joshua Andrez PARAS, LPN Outcome: Adequate for Discharge 10/13/2024 1318 by Joshua Andrez PARAS, LPN Outcome: Adequate for Discharge Goal: Range of joint motion will improve 10/13/2024 1318 by Joshua Andrez PARAS, LPN Outcome: Adequate for Discharge 10/13/2024 1318 by Joshua Andrez PARAS, LPN Outcome: Adequate for Discharge   Problem: Clinical Measurements: Goal: Postoperative complications will be avoided or minimized 10/13/2024 1318 by Joshua Andrez PARAS, LPN Outcome: Adequate for Discharge 10/13/2024 1318 by Joshua Andrez PARAS, LPN Outcome: Adequate for Discharge   Problem: Pain Management: Goal: Pain level will decrease with appropriate interventions 10/13/2024 1318 by Joshua Andrez PARAS, LPN Outcome: Adequate for Discharge 10/13/2024 1318 by Joshua Andrez PARAS, LPN Outcome: Adequate for Discharge   Problem: Skin Integrity: Goal: Will show signs of wound healing 10/13/2024 1318 by Joshua Andrez PARAS, LPN Outcome: Adequate for Discharge 10/13/2024 1318 by Joshua Andrez PARAS, LPN Outcome: Adequate for Discharge

## 2024-10-13 NOTE — Progress Notes (Signed)
 Physical Therapy Treatment Patient Details Name: Stacy Warren MRN: [id number] DOB: Jul 12, 1951 Today's Date: 10/13/2024   History of Present Illness Pt is a 73 y.o. 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 PMH: PE, CKD, OA    PT Comments  Patient received in recliner. She is agreeable to PT session as she wants to go home today. Patient required min A to stand from low recliner with cues needed. She ambulated ~ 10 feet, sat a rested then continued ambulation for another 15-20 feet with cga/supervision. Patient making good progress this session and will continue to benefit from skilled PT to ensure safe mobility at home.     If plan is discharge home, recommend the following: A lot of help with bathing/dressing/bathroom;Assist for transportation;Help with stairs or ramp for entrance;A little help with walking and/or transfers   Can travel by private vehicle     Yes  Equipment Recommendations  None recommended by PT    Recommendations for Other Services       Precautions / Restrictions Precautions Precautions: Fall Recall of Precautions/Restrictions: Intact Precaution/Restrictions Comments: hemovac Restrictions Weight Bearing Restrictions Per Provider Order: Yes LLE Weight Bearing Per Provider Order: Weight bearing as tolerated     Mobility  Bed Mobility               General bed mobility comments: NT patient received in recliner    Transfers Overall transfer level: Needs assistance Equipment used: Rolling walker (2 wheels) Transfers: Sit to/from Stand Sit to Stand: Min assist           General transfer comment: Min A needed from low recliner. Supervision from elevated commode seat    Ambulation/Gait Ambulation/Gait assistance: Supervision Gait Distance (Feet): 25 Feet Assistive device: Rolling walker (2  wheels) Gait Pattern/deviations: Step-to pattern, Decreased step length - right, Decreased step length - left, Decreased weight shift to left, Decreased stride length, Trunk flexed Gait velocity: Decr     General Gait Details: supervision during ambulation with seated rest after about 10 feet. Progressing well.   Stairs             Wheelchair Mobility     Tilt Bed    Modified Rankin (Stroke Patients Only)       Balance Overall balance assessment: Needs assistance Sitting-balance support: Feet supported Sitting balance-Leahy Scale: Good     Standing balance support: Bilateral upper extremity supported, During functional activity, Reliant on assistive device for balance Standing balance-Leahy Scale: Fair Standing balance comment: cga                            Communication Communication Communication: No apparent difficulties  Cognition Arousal: Alert Behavior During Therapy: WFL for tasks assessed/performed   PT - Cognitive impairments: No apparent impairments                         Following commands: Intact      Cueing Cueing Techniques: Verbal cues  Exercises      General Comments        Pertinent Vitals/Pain Pain Assessment Faces Pain Scale: Hurts a little bit Pain Location: L thigh/femur Pain Descriptors / Indicators: Grimacing, Guarding Pain Intervention(s): Monitored during session, Premedicated before session, Repositioned    Home Living  Prior Function            PT Goals (current goals can now be found in the care plan section) Acute Rehab PT Goals Patient Stated Goal: go home PT Goal Formulation: With patient Time For Goal Achievement: 10/26/24 Potential to Achieve Goals: Fair Progress towards PT goals: Progressing toward goals    Frequency    BID      PT Plan      Co-evaluation              AM-PAC PT 6 Clicks Mobility   Outcome Measure  Help  needed turning from your back to your side while in a flat bed without using bedrails?: A Little Help needed moving from lying on your back to sitting on the side of a flat bed without using bedrails?: A Little Help needed moving to and from a bed to a chair (including a wheelchair)?: A Little Help needed standing up from a chair using your arms (e.g., wheelchair or bedside chair)?: A Little Help needed to walk in hospital room?: A Little Help needed climbing 3-5 steps with a railing? : A Lot 6 Click Score: 17    End of Session   Activity Tolerance: Patient tolerated treatment well Patient left: in chair;with call bell/phone within reach;with chair alarm set Nurse Communication: Mobility status PT Visit Diagnosis: Muscle weakness (generalized) (M62.81);History of falling (Z91.81);Difficulty in walking, not elsewhere classified (R26.2);Pain;Other abnormalities of gait and mobility (R26.89) Pain - Right/Left: Left Pain - part of body: Knee     Time: 0921-0936 PT Time Calculation (min) (ACUTE ONLY): 15 min  Charges:    $Gait Training: 8-22 mins PT General Charges $$ ACUTE PT VISIT: 1 Visit                     Maccoy Haubner, PT, GCS 10/13/24,10:03 AM

## 2024-10-13 NOTE — Progress Notes (Signed)
 Physical Therapy Treatment Patient Details Name: Stacy Warren MRN: [id number] DOB: 1951-08-14 Today's Date: 10/13/2024   History of Present Illness Pt is a 73 y.o. 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 PMH: PE, CKD, OA    PT Comments  Patient received in recliner, she is agreeable to PT session. Requests using bathroom prior to session. She requires min/cga A for sit to stand, cga for step pivoting to [facility]. Ambulated 40 feet with RW and cues, cga. Ambulated up/down 3 steps with B rails then 1 step with single R rail. Patient requires cga for stairs. She will continue to benefit from skilled PT to improve safety, independence, strength and ROM.      If plan is discharge home, recommend the following: A little help with walking and/or transfers;A little help with bathing/dressing/bathroom;Help with stairs or ramp for entrance;Assist for transportation   Can travel by private vehicle     Yes  Equipment Recommendations  None recommended by PT    Recommendations for Other Services       Precautions / Restrictions Precautions Precautions: Fall Recall of Precautions/Restrictions: Intact Precaution/Restrictions Comments: hemovac Restrictions Weight Bearing Restrictions Per Provider Order: Yes LLE Weight Bearing Per Provider Order: Weight bearing as tolerated     Mobility  Bed Mobility               General bed mobility comments: NT patient received in recliner    Transfers Overall transfer level: Needs assistance Equipment used: Rolling walker (2 wheels) Transfers: Sit to/from Stand, Bed to chair/wheelchair/BSC Sit to Stand: Contact guard assist, Min assist   Step pivot transfers: Contact guard assist       General transfer comment: Min A needed from low recliner. Supervision from elevated commode seat     Ambulation/Gait Ambulation/Gait assistance: Contact guard assist Gait Distance (Feet): 40 Feet Assistive device: Rolling walker (2 wheels) Gait Pattern/deviations: Step-to pattern, Decreased step length - right, Decreased step length - left, Decreased weight shift to left, Decreased stride length, Trunk flexed Gait velocity: Decr     General Gait Details: Cues needed for safety, keeping walker close to her.   Stairs Stairs: Yes Stairs assistance: Contact guard assist Stair Management: Two rails, Step to pattern Number of Stairs: 4 General stair comments: then 1 step up/down with single R rail. Cga   Wheelchair Mobility     Tilt Bed    Modified Rankin (Stroke Patients Only)       Balance Overall balance assessment: Needs assistance Sitting-balance support: Feet supported Sitting balance-Leahy Scale: Normal     Standing balance support: Bilateral upper extremity supported, During functional activity, Reliant on assistive device for balance Standing balance-Leahy Scale: Fair Standing balance comment: cga                            Communication Communication Communication: No apparent difficulties  Cognition Arousal: Alert Behavior During Therapy: WFL for tasks assessed/performed   PT - Cognitive impairments: No apparent impairments                         Following commands: Intact      Cueing Cueing Techniques: Verbal cues  Exercises Total Joint Exercises Ankle Circles/Pumps: AROM, Both Hip ABduction/ADduction: AAROM, Left, 10 reps Straight Leg Raises: AAROM, Left, 10  reps Long Arc Quad: AROM, Left, 5 reps Goniometric ROM: 0-~80    General Comments        Pertinent Vitals/Pain Pain Assessment Pain Assessment: Faces Faces Pain Scale: Hurts a little bit Pain Location: L thigh/femur Pain Descriptors / Indicators: Grimacing, Guarding Pain Intervention(s): Monitored during session, Repositioned, Ice applied    Home Living                           Prior Function            PT Goals (current goals can now be found in the care plan section) Acute Rehab PT Goals Patient Stated Goal: go home PT Goal Formulation: With patient Time For Goal Achievement: 10/26/24 Potential to Achieve Goals: Fair Progress towards PT goals: Progressing toward goals    Frequency    BID      PT Plan      Co-evaluation              AM-PAC PT 6 Clicks Mobility   Outcome Measure  Help needed turning from your back to your side while in a flat bed without using bedrails?: A Little Help needed moving from lying on your back to sitting on the side of a flat bed without using bedrails?: A Little Help needed moving to and from a bed to a chair (including a wheelchair)?: A Little Help needed standing up from a chair using your arms (e.g., wheelchair or bedside chair)?: A Little Help needed to walk in hospital room?: A Little Help needed climbing 3-5 steps with a railing? : A Little 6 Click Score: 18    End of Session   Activity Tolerance: Patient tolerated treatment well Patient left: in chair;with call bell/phone within reach;with chair alarm set Nurse Communication: Mobility status PT Visit Diagnosis: Muscle weakness (generalized) (M62.81);History of falling (Z91.81);Difficulty in walking, not elsewhere classified (R26.2);Pain;Other abnormalities of gait and mobility (R26.89) Pain - Right/Left: Left Pain - part of body: Knee     Time: 1140-1206 PT Time Calculation (min) (ACUTE ONLY): 26 min  Charges:    $Gait Training: 8-22 mins $Therapeutic Activity: 8-22 mins PT General Charges $$ ACUTE PT VISIT: 1 Visit                     Karisma Meiser, PT, GCS 10/13/24,12:34 PM

## 2024-10-13 NOTE — Progress Notes (Signed)
 2 Days Post-Op   Subjective/Chief Complaint: Doing well. Denies foot pain.   Objective: Vital signs in last 24 hours: Temp:  [98.1 F (36.7 C)-98.8 F (37.1 C)] 98.8 F (37.1 C) (10/19 0811) Pulse Rate:  [81-92] 92 (10/19 0811) Resp:  [15-18] 17 (10/19 0811) BP: (97-115)/(52-73) 111/72 (10/19 0811) SpO2:  [93 %-98 %] 98 % (10/19 0811) Last BM Date : 10/11/24  Intake/Output from previous day: 10/18 0701 - 10/19 0700 In: 1149.8 [P.O.:660; I.V.:389.6; IV Piggyback:100.2] Out: 400 [Drains:400] Intake/Output this shift: Total I/O In: -  Out: 50 [Drains:50]  General appearance: alert and no distress Cardio: regular rate and rhythm Extremities: LEFT lower extremity- dressing and JP in place. Foot warm, +DP/+PT, +motor/+sensory  Lab Results:  Recent Labs    10/11/24 2116  HGB 11.2*  HCT 34.7*   BMET No results for input(s): NA, K, CL, CO2, GLUCOSE, BUN, CREATININE, CALCIUM in the last 72 hours. PT/INR No results for input(s): LABPROT, INR in the last 72 hours. ABG No results for input(s): PHART, HCO3 in the last 72 hours.  Invalid input(s): PCO2, PO2  Studies/Results: DG Knee Left Port Result Date: 10/11/2024 CLINICAL DATA:  Left knee arthroplasty EXAM: PORTABLE LEFT KNEE - 1-2 VIEW COMPARISON:  None Available. FINDINGS: Frontal and cross-table lateral views of the left knee are obtained. Three component left knee arthroplasty is identified in the expected position, without evidence of acute complication. Postsurgical changes are seen within the anterior soft tissues, with surgical drains in place. IMPRESSION: 1. Unremarkable left knee arthroplasty. Electronically Signed   By: Ozell Daring M.D.   On: 10/11/2024 20:47    Anti-infectives: Anti-infectives (From admission, onward)    Start     Dose/Rate Route Frequency Ordered Stop   10/11/24 2300  ceFAZolin  (ANCEF ) IVPB 2g/100 mL premix        2 g 200 mL/hr over 30 Minutes Intravenous  Every 6 hours 10/11/24 2115 10/12/24 0624   10/11/24 1000  ceFAZolin  (ANCEF ) IVPB 2g/100 mL premix        2 g 200 mL/hr over 30 Minutes Intravenous On call to O.R. 10/11/24 0948 10/11/24 1706       Assessment/Plan: s/p Procedure(s): ARTHROPLASTY, KNEE, TOTAL, USING IMAGELESS COMPUTER-ASSISTED NAVIGATION AND REPAIR OF POPLITEAL ARTERY -- POPLITEAL ARTERY BYPASS WITH GRAFT (Left) POD #2 OK from Vascular standpoint for discharge home Follow Up in office in 1-2 weeks Minimum Aspirin for now  LOS: 0 days    Stacy Warren A 10/13/2024

## 2024-10-13 NOTE — Plan of Care (Signed)

## 2024-10-13 NOTE — Plan of Care (Signed)

## 2024-10-13 NOTE — Discharge Summary (Signed)
 Physician Discharge Summary  Subjective: 2 Days Post-Op Procedure(s) (LRB): ARTHROPLASTY, KNEE, TOTAL, USING IMAGELESS COMPUTER-ASSISTED NAVIGATION AND REPAIR OF POPLITEAL ARTERY -- POPLITEAL ARTERY BYPASS WITH GRAFT (Left) Patient reports pain as mild.   Patient seen in rounds with Dr. Mardee. Patient is well, and has had no acute complaints or problems Denies any CP, SOB, N/V, fevers or chills We will start therapy today.  Patient is ready to go home  Physician Discharge Summary  Patient ID: ARRIANA LOHMANN MRN: [id number] DOB/AGE: 19-Feb-1951 73 y.o.  Admit date: 10/11/2024 Discharge date: 10/13/2024  Admission Diagnoses:  Discharge Diagnoses:  Principal Problem:   History of total knee arthroplasty, left   Discharged Condition: good  Hospital Course: Patient presented to the hospital on 10/11/2024 for an elective left total knee arthroplasty. Patient was given 1g of TXA and 2g of Ancef  prior to the procedure. She did have an issue during surgery where part of her popliteal vasculature was compromised and Dr. Marea with vascular was consulted and presented to the OR to perform a popliteal artery bypass with grafting. See other procedural note below for details. Postoperatively, the patient did very well. she was able to pass PT protocols on post-op day two without any issues. JP drain was removed without any difficulty and was intact on PO day 2. she was able to void her bladder without any difficulty. Physical exam was unremarkable. she denies any SOB, CP, N/V, fevers or chills. Vital signs are stable. Patient is stable to discharge home.  PROCEDURE:  Left total knee arthroplasty using computer-assisted navigation   SURGEON:  Lynwood SHAUNNA Mardee Mickey. M.D.   ANESTHESIA: spinal and general   ESTIMATED BLOOD LOSS: 500 mL   FLUIDS REPLACED: 2000 mL of crystalloid   TOURNIQUET TIME: 191 minutes   DRAINS: 2 medium Hemovac drains   SOFT TISSUE RELEASES: Anterior cruciate  ligament, posterior cruciate ligament, deep medial collateral ligament, patellofemoral ligament   IMPLANTS UTILIZED: DePuy Attune size 8 posterior stabilized femoral component (cemented), size 8 rotating platform tibial component (cemented), 41 mm medialized dome patella (cemented), and an 18 mm stabilized rotating platform polyethylene insert.   COMPLICATION: Popliteal artery laceration (see separate Operative Report by Dr. Selinda Marea for details regarding the repair.)    Treatments: None  Discharge Exam: Blood pressure 111/72, pulse 92, temperature 98.8 F (37.1 C), resp. rate 17, height 5' 2 (1.575 m), weight 91.6 kg, SpO2 98%.   Disposition: home There are no questions and answers to display.         Allergies as of 10/13/2024   No Known Allergies      Medication List     STOP taking these medications    ibuprofen  400 MG tablet Commonly known as: ADVIL    sulfamethoxazole -trimethoprim  800-160 MG tablet Commonly known as: BACTRIM  DS       TAKE these medications    acetaminophen  650 MG CR tablet Commonly known as: TYLENOL  Take 650 mg by mouth every 8 (eight) hours as needed for pain.   aspirin 81 MG chewable tablet Chew 1 tablet (81 mg total) by mouth 2 (two) times daily.   Biotin 10000 MCG Tabs Take 1 tablet by mouth daily.   CALCIUM 1200+D3 PO Take 1 tablet by mouth every other day.   Calcium Carb-Cholecalciferol 600-20 MG-MCG Chew Chew 1 tablet by mouth every other day.   celecoxib 200 MG capsule Commonly known as: CELEBREX Take 1 capsule (200 mg total) by mouth 2 (two) times daily.   chlorhexidine   4 % external liquid Commonly known as: HIBICLENS  Apply 15 mLs (1 Application total) topically as directed for 30 doses. Use as directed daily for 5 days every other week for 6 weeks.   chlorpheniramine 4 MG tablet Commonly known as: CHLOR-TRIMETON Take 4 mg by mouth in the morning.   Coenzyme Q10 200 MG Tabs Take 200 mg by mouth daily.    fluticasone 50 MCG/ACT nasal spray Commonly known as: FLONASE Place 1 spray into both nostrils daily as needed for allergies or rhinitis.   Glucosamine Chond MSM Formula Tabs Take 1 tablet by mouth daily.   Intrarosa  6.5 MG Inst Generic drug: Prasterone  PLACE 6.5 MG VAGINALLY AT BEDTIME. What changed: when to take this   mupirocin ointment 2 % Commonly known as: BACTROBAN Place 1 Application into the nose 2 (two) times daily for 60 doses. Use as directed 2 times daily for 5 days every other week for 6 weeks.   oxyCODONE  5 MG immediate release tablet Commonly known as: Oxy IR/ROXICODONE  Take 1 tablet (5 mg total) by mouth every 4 (four) hours as needed for moderate pain (pain score 4-6) (pain score 4-6).   Systane Balance 0.6 % Soln Generic drug: Propylene Glycol Apply to eye.   SYSTANE OP Place 1 drop into both eyes daily as needed (dry eyes).   traMADol 50 MG tablet Commonly known as: ULTRAM Take 1-2 tablets (50-100 mg total) by mouth every 4 (four) hours as needed for moderate pain (pain score 4-6).   Turmeric 500 MG Tabs Take 1,000 mg by mouth daily.   Vitafusion Multi Womens Chew Chew 1 tablet by mouth daily.   Vitamin B-12 5000 MCG Tbdp Take 5,000 mcg by mouth daily in the afternoon.   Vitamin D3 25 MCG (1000 UT) Caps Take 1,000 Units by mouth daily.               Durable Medical Equipment  (From admission, onward)           Start     Ordered   10/11/24 2115  DME Walker rolling  Once       Question:  Patient needs a walker to treat with the following condition  Answer:  Total knee replacement status   10/11/24 2115   10/11/24 2115  DME Bedside commode  Once       Comments: Patient is not able to walk the distance required to go the bathroom, or he/she is unable to safely negotiate stairs required to access the bathroom.  A 3in1 BSC will alleviate this problem  Question:  Patient needs a bedside commode to treat with the following condition   Answer:  Total knee replacement status   10/11/24 2115            Follow-up Information     Drake Chew, PA-C Follow up on 10/25/2024.   Specialty: Orthopedic Surgery Why: at 9:45am Contact information: [address] [place] [address] [phone]         Mardee Lynwood SQUIBB, MD Follow up on 11/26/2024.   Specialty: Orthopedic Surgery Why: at 2:45pm Contact information: [address] [facility] [place] [address] [phone]                 Signed: Sidra Drake 10/13/2024, 1:22 PM   Objective: Vital signs in last 24 hours: Temp:  [98.1 F (36.7 C)-98.8 F (37.1 C)] 98.8 F (37.1 C) (10/19 0811) Pulse Rate:  [81-92] 92 (10/19 0811) Resp:  [15-18] 17 (  10/19 0811) BP: (97-115)/(52-73) 111/72 (10/19 0811) SpO2:  [93 %-98 %] 98 % (10/19 0811)  Intake/Output from previous day:  Intake/Output Summary (Last 24 hours) at 10/13/2024 1322 Last data filed at 10/13/2024 0900 Gross per 24 hour  Intake 1389.78 ml  Output 450 ml  Net 939.78 ml    Intake/Output this shift: Total I/O In: 240 [P.O.:240] Out: 50 [Drains:50]  Labs: Recent Labs    10/11/24 2116  HGB 11.2*   Recent Labs    10/11/24 2116  HCT 34.7*   No results for input(s): NA, K, CL, CO2, BUN, CREATININE, GLUCOSE, CALCIUM in the last 72 hours. No results for input(s): LABPT, INR in the last 72 hours.  EXAM: General - Patient is Alert, Appropriate, and Oriented Extremity - Neurologically intact Neurovascular intact Sensation intact distally Intact pulses distally Dorsiflexion/Plantar flexion intact No cellulitis present Compartment soft Dressing - dressing C/D/I and no drainage Motor Function - intact, moving foot and toes well on exam. JP Drain removed without difficulty and intact  Assessment/Plan: 2 Days Post-Op Procedure(s) (LRB): ARTHROPLASTY, KNEE, TOTAL, USING IMAGELESS COMPUTER-ASSISTED NAVIGATION AND REPAIR OF  POPLITEAL ARTERY -- POPLITEAL ARTERY BYPASS WITH GRAFT (Left) Procedure(s) (LRB): ARTHROPLASTY, KNEE, TOTAL, USING IMAGELESS COMPUTER-ASSISTED NAVIGATION AND REPAIR OF POPLITEAL ARTERY -- POPLITEAL ARTERY BYPASS WITH GRAFT (Left) Past Medical History:  Diagnosis Date   Aromatase inhibitor use 06/11/2020   Arthritis    Breast cancer (HCC)    Cancer (HCC)    Glaucoma    Motion sickness    back  seat - cars   Osteopenia 11/28/2018   Pulmonary embolism (HCC) 04/09/2018   Completed course of blood thinner in 2007; after femur fracture and surgery   Principal Problem:   History of total knee arthroplasty, left  Estimated body mass index is 36.95 kg/m as calculated from the following:   Height as of this encounter: 5' 2 (1.575 m).   Weight as of this encounter: 91.6 kg.   Patient will continue to work with physical therapy to pass postoperative PT protocols, ROM and strengthening   Discussed with the patient continuing to utilize Polar Care   Patient will use bone foam in 20-30 minute intervals   Patient will wear TED hose bilaterally to help prevent DVT and clot formation   Discussed the Aquacel bandage.  This bandage will stay in place 7 days postoperatively.  Can be replaced with honeycomb bandages that will be sent home with the patient   Discussed sending the patient home with tramadol and oxycodone  for as needed pain management.  Patient will also be sent home with Celebrex to help with swelling and inflammation.  Patient will take an 81 mg aspirin twice daily for DVT prophylaxis   JP drain removed without any issue, intact   Weight-Bearing as tolerated to left leg   Stable per vascular for discharge home   Patient will follow-up with [facility] clinic orthopedics in 2 weeks for staple removal and reevaluation  Diet - Regular diet Follow up - in 2 weeks Activity - WBAT Disposition - Home Condition Upon Discharge - Good DVT Prophylaxis - Aspirin and TED  hose  Fonda CHARLENA Koyanagi, PA-C Orthopaedic Surgery 10/13/2024, 1:22 PM

## 2024-10-14 ENCOUNTER — Encounter: Payer: Self-pay | Admitting: Orthopedic Surgery

## 2024-10-15 DIAGNOSIS — Z96652 Presence of left artificial knee joint: Secondary | ICD-10-CM | POA: Diagnosis not present

## 2024-10-15 DIAGNOSIS — N393 Stress incontinence (female) (male): Secondary | ICD-10-CM | POA: Diagnosis not present

## 2024-10-15 DIAGNOSIS — Z471 Aftercare following joint replacement surgery: Secondary | ICD-10-CM | POA: Diagnosis not present

## 2024-10-15 DIAGNOSIS — M8580 Other specified disorders of bone density and structure, unspecified site: Secondary | ICD-10-CM | POA: Diagnosis not present

## 2024-10-15 DIAGNOSIS — H409 Unspecified glaucoma: Secondary | ICD-10-CM | POA: Diagnosis not present

## 2024-10-15 DIAGNOSIS — K5730 Diverticulosis of large intestine without perforation or abscess without bleeding: Secondary | ICD-10-CM | POA: Diagnosis not present

## 2024-10-15 DIAGNOSIS — E669 Obesity, unspecified: Secondary | ICD-10-CM | POA: Diagnosis not present

## 2024-10-15 DIAGNOSIS — N182 Chronic kidney disease, stage 2 (mild): Secondary | ICD-10-CM | POA: Diagnosis not present

## 2024-10-15 DIAGNOSIS — Z6837 Body mass index (BMI) 37.0-37.9, adult: Secondary | ICD-10-CM | POA: Diagnosis not present

## 2024-10-21 DIAGNOSIS — M8580 Other specified disorders of bone density and structure, unspecified site: Secondary | ICD-10-CM | POA: Diagnosis not present

## 2024-10-21 DIAGNOSIS — E669 Obesity, unspecified: Secondary | ICD-10-CM | POA: Diagnosis not present

## 2024-10-21 DIAGNOSIS — Z471 Aftercare following joint replacement surgery: Secondary | ICD-10-CM | POA: Diagnosis not present

## 2024-10-21 DIAGNOSIS — N393 Stress incontinence (female) (male): Secondary | ICD-10-CM | POA: Diagnosis not present

## 2024-10-21 DIAGNOSIS — K5730 Diverticulosis of large intestine without perforation or abscess without bleeding: Secondary | ICD-10-CM | POA: Diagnosis not present

## 2024-10-21 DIAGNOSIS — N182 Chronic kidney disease, stage 2 (mild): Secondary | ICD-10-CM | POA: Diagnosis not present

## 2024-10-21 DIAGNOSIS — H409 Unspecified glaucoma: Secondary | ICD-10-CM | POA: Diagnosis not present

## 2024-10-21 DIAGNOSIS — Z6837 Body mass index (BMI) 37.0-37.9, adult: Secondary | ICD-10-CM | POA: Diagnosis not present

## 2024-10-23 DIAGNOSIS — K5730 Diverticulosis of large intestine without perforation or abscess without bleeding: Secondary | ICD-10-CM | POA: Diagnosis not present

## 2024-10-23 DIAGNOSIS — M8580 Other specified disorders of bone density and structure, unspecified site: Secondary | ICD-10-CM | POA: Diagnosis not present

## 2024-10-23 DIAGNOSIS — Z471 Aftercare following joint replacement surgery: Secondary | ICD-10-CM | POA: Diagnosis not present

## 2024-10-23 DIAGNOSIS — Z6837 Body mass index (BMI) 37.0-37.9, adult: Secondary | ICD-10-CM | POA: Diagnosis not present

## 2024-10-23 DIAGNOSIS — H409 Unspecified glaucoma: Secondary | ICD-10-CM | POA: Diagnosis not present

## 2024-10-23 DIAGNOSIS — N393 Stress incontinence (female) (male): Secondary | ICD-10-CM | POA: Diagnosis not present

## 2024-10-23 DIAGNOSIS — N182 Chronic kidney disease, stage 2 (mild): Secondary | ICD-10-CM | POA: Diagnosis not present

## 2024-10-23 DIAGNOSIS — Z96652 Presence of left artificial knee joint: Secondary | ICD-10-CM | POA: Diagnosis not present

## 2024-10-23 DIAGNOSIS — E669 Obesity, unspecified: Secondary | ICD-10-CM | POA: Diagnosis not present

## 2024-10-28 DIAGNOSIS — Z96652 Presence of left artificial knee joint: Secondary | ICD-10-CM | POA: Diagnosis not present

## 2024-10-28 DIAGNOSIS — M25662 Stiffness of left knee, not elsewhere classified: Secondary | ICD-10-CM | POA: Diagnosis not present

## 2024-10-28 DIAGNOSIS — M25562 Pain in left knee: Secondary | ICD-10-CM | POA: Diagnosis not present

## 2024-10-28 DIAGNOSIS — R2689 Other abnormalities of gait and mobility: Secondary | ICD-10-CM | POA: Diagnosis not present

## 2024-10-29 ENCOUNTER — Ambulatory Visit: Admitting: Obstetrics and Gynecology

## 2024-11-04 DIAGNOSIS — M25562 Pain in left knee: Secondary | ICD-10-CM | POA: Diagnosis not present

## 2024-11-04 DIAGNOSIS — R2689 Other abnormalities of gait and mobility: Secondary | ICD-10-CM | POA: Diagnosis not present

## 2024-11-04 DIAGNOSIS — M25662 Stiffness of left knee, not elsewhere classified: Secondary | ICD-10-CM | POA: Diagnosis not present

## 2024-11-04 DIAGNOSIS — Z96652 Presence of left artificial knee joint: Secondary | ICD-10-CM | POA: Diagnosis not present

## 2024-11-08 DIAGNOSIS — Z471 Aftercare following joint replacement surgery: Secondary | ICD-10-CM | POA: Diagnosis not present

## 2024-11-12 ENCOUNTER — Other Ambulatory Visit (INDEPENDENT_AMBULATORY_CARE_PROVIDER_SITE_OTHER): Payer: Self-pay | Admitting: Vascular Surgery

## 2024-11-12 DIAGNOSIS — M25562 Pain in left knee: Secondary | ICD-10-CM | POA: Diagnosis not present

## 2024-11-12 DIAGNOSIS — M25662 Stiffness of left knee, not elsewhere classified: Secondary | ICD-10-CM | POA: Diagnosis not present

## 2024-11-12 DIAGNOSIS — Z86711 Personal history of pulmonary embolism: Secondary | ICD-10-CM

## 2024-11-12 DIAGNOSIS — Z96652 Presence of left artificial knee joint: Secondary | ICD-10-CM | POA: Diagnosis not present

## 2024-11-12 DIAGNOSIS — R2689 Other abnormalities of gait and mobility: Secondary | ICD-10-CM | POA: Diagnosis not present

## 2024-11-12 DIAGNOSIS — Z95828 Presence of other vascular implants and grafts: Secondary | ICD-10-CM

## 2024-11-13 ENCOUNTER — Other Ambulatory Visit: Payer: Self-pay

## 2024-11-13 ENCOUNTER — Ambulatory Visit (INDEPENDENT_AMBULATORY_CARE_PROVIDER_SITE_OTHER): Admitting: Nurse Practitioner

## 2024-11-13 ENCOUNTER — Encounter (INDEPENDENT_AMBULATORY_CARE_PROVIDER_SITE_OTHER): Payer: Self-pay | Admitting: Nurse Practitioner

## 2024-11-13 ENCOUNTER — Other Ambulatory Visit (INDEPENDENT_AMBULATORY_CARE_PROVIDER_SITE_OTHER)

## 2024-11-13 VITALS — BP 138/84 | HR 64 | Resp 18 | Ht 62.0 in | Wt 197.8 lb

## 2024-11-13 DIAGNOSIS — Z86711 Personal history of pulmonary embolism: Secondary | ICD-10-CM

## 2024-11-13 DIAGNOSIS — Z95828 Presence of other vascular implants and grafts: Secondary | ICD-10-CM | POA: Diagnosis not present

## 2024-11-13 DIAGNOSIS — M1712 Unilateral primary osteoarthritis, left knee: Secondary | ICD-10-CM

## 2024-11-14 ENCOUNTER — Other Ambulatory Visit (HOSPITAL_COMMUNITY): Payer: Self-pay

## 2024-11-14 ENCOUNTER — Other Ambulatory Visit: Payer: Self-pay

## 2024-11-14 DIAGNOSIS — R2689 Other abnormalities of gait and mobility: Secondary | ICD-10-CM | POA: Diagnosis not present

## 2024-11-14 DIAGNOSIS — M25562 Pain in left knee: Secondary | ICD-10-CM | POA: Diagnosis not present

## 2024-11-14 DIAGNOSIS — M25662 Stiffness of left knee, not elsewhere classified: Secondary | ICD-10-CM | POA: Diagnosis not present

## 2024-11-14 DIAGNOSIS — Z96652 Presence of left artificial knee joint: Secondary | ICD-10-CM | POA: Diagnosis not present

## 2024-11-17 ENCOUNTER — Encounter (INDEPENDENT_AMBULATORY_CARE_PROVIDER_SITE_OTHER): Payer: Self-pay | Admitting: Nurse Practitioner

## 2024-11-17 NOTE — Progress Notes (Signed)
 SUBJECTIVE:  Patient ID: Stacy Warren, female    DOB: 11/15/1951, 73 y.o.   MRN: [id number] Chief Complaint  Patient presents with   Follow-up    1 month follow up post procedure bilateral DVT scan    Discussed the use of AI scribe software for clinical note transcription with the patient, who gave verbal consent to proceed.  History of Present Illness Stacy Warren is a 73 year old female who presents for follow-up after knee surgery and IVC filter placement.  Approximately one month ago, she underwent knee surgery during which an arterial injury occurred, necessitating repair. Since the surgery, she has experienced persistent swelling in the leg, particularly around the ankle, which she attributes to inflammation from the knee surgery and arterial repair. An ultrasound performed today showed no new blood clots, only old ones.  She has a history of IVC filter placement prior to her knee surgery to prevent blood clots. She expresses psychological discomfort knowing the filter is still in place.  She is actively participating in physical therapy and exercises at home to aid recovery. She is the primary caregiver for her husband who has dementia, which keeps her active throughout the day. She mentions that her skin is very dry, likely due to the swelling and the season, and she uses lotion to manage this.  She is currently walking most of the day but rests in the afternoon when tired.     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 (11/13/2024)  Past Medical History:  Diagnosis Date   Aromatase inhibitor use 06/11/2020   Arthritis    Breast cancer (HCC)    Cancer (HCC)    Glaucoma    Motion sickness    back  seat - cars   Osteopenia 11/28/2018   Pulmonary embolism (HCC) 04/09/2018   Completed course of blood thinner in 2007; after femur fracture and surgery    Past Surgical History:  Procedure Laterality Date   BIOPSY  01/23/2024    Procedure: BIOPSY;  Surgeon: Unk Corinn Skiff, MD;  Location: [facility];  Service: Endoscopy;;   BREAST BIOPSY Right 12/13/2018   stereo bx of mass, coil clip INVASIVE MAMMARY CARCINOMA   BREAST LUMPECTOMY Right 12/31/2018   [facility]   BREAST LUMPECTOMY WITH NEEDLE LOCALIZATION AND AXILLARY SENTINEL LYMPH NODE BX Right 12/31/2018   Procedure: BREAST LUMPECTOMY WITH SENTINEL LYMPH NODE BX, WIRE LOCALIZATION;  Surgeon: Nicholaus Selinda Birmingham, MD;  Location: [facility];  Service: General;  Laterality: Right;   bunionectomy Left    CATARACT EXTRACTION W/PHACO Left 10/12/2022   Procedure: CATARACT EXTRACTION PHACO AND INTRAOCULAR LENS PLACEMENT (IOC) LEFT 11.13 01:13.9;  Surgeon: Mittie Gaskin, MD;  Location: [facility];  Service: Ophthalmology;  Laterality: Left;   CATARACT EXTRACTION W/PHACO Right 10/26/2022   Procedure: CATARACT EXTRACTION PHACO AND INTRAOCULAR LENS PLACEMENT (IOC) RIGHT 12.71 01:14.8;  Surgeon: Mittie Gaskin, MD;  Location: [facility];  Service: Ophthalmology;  Laterality: Right;   CERVICAL CONE BIOPSY  1984   COLONOSCOPY WITH PROPOFOL  N/A 01/23/2024   Procedure: COLONOSCOPY WITH PROPOFOL ;  Surgeon: Unk Corinn Skiff, MD;  Location: [facility];  Service: Endoscopy;  Laterality: N/A;  COLON SCOPE REMOVED @ 0836 AND CHANGED OVER TO A PEDS SCOPE IN @ [phone] CECUM REACHED @ 0846   DILATION AND CURETTAGE OF UTERUS  06/07/2018   EYE SURGERY     HIP PINNING  2007   HYSTEROSCOPY WITH D & C N/A 06/07/2018   Procedure:  DILATATION AND CURETTAGE /HYSTEROSCOPY/MYOSURE POLYPECTOMY;  Surgeon: Lake Read, MD;  Location: [facility];  Service: Gynecology;  Laterality: N/A;   IVC FILTER INSERTION N/A 10/09/2024   Procedure: IVC FILTER INSERTION;  Surgeon: Jama Cordella MATSU, MD;  Location: [facility] INVASIVE CV LAB;  Service: Cardiovascular;  Laterality: N/A;   KNEE ARTHROPLASTY Left 10/11/2024   Procedure: ARTHROPLASTY, KNEE, TOTAL, USING IMAGELESS  COMPUTER-ASSISTED NAVIGATION AND REPAIR OF POPLITEAL ARTERY -- POPLITEAL ARTERY BYPASS WITH GRAFT;  Surgeon: Mardee Lynwood SQUIBB, MD;  Location: [facility];  Service: Orthopedics;  Laterality: Left;   LEG SURGERY  2007   plate   TONSILLECTOMY     VAGINAL DELIVERY     x2    Social History   Socioeconomic History   Marital status: Married    Spouse name: Merilee   Number of children: 2   Years of education: 12   Highest education level: Some college, no degree  Occupational History   Occupation: retired   Occupation: retired  Tobacco Use   Smoking status: Never   Smokeless tobacco: Never  Vaping Use   Vaping status: Never Used  Substance and Sexual Activity   Alcohol use: No   Drug use: No   Sexual activity: Not Currently    Partners: Male    Birth control/protection: None  Other Topics Concern   Not on file  Social History Narrative   Not on file   Social Drivers of Health   Financial Resource Strain: Low Risk  (10/24/2024)   Received from [facility]   Overall Financial Resource Strain (CARDIA)    Difficulty of Paying Living Expenses: Not hard at all  Food Insecurity: No Food Insecurity (10/24/2024)   Received from [facility]   Hunger Vital Sign    Within the past 12 months, you worried that your food would run out before you got the money to buy more.: Never true    Within the past 12 months, the food you bought just didn't last and you didn't have money to get more.: Never true  Transportation Needs: No Transportation Needs (10/24/2024)   Received from [facility] - Transportation    In the past 12 months, has lack of transportation kept you from medical appointments or from getting medications?: No    Lack of Transportation (Non-Medical): No  Physical Activity: Inactive (06/11/2024)   Exercise Vital Sign    Days of Exercise per Week: 0 days    Minutes of Exercise per Session: 0 min  Stress: Stress  Concern Present (06/11/2024)   [employer] of Occupational [employer] - Occupational Stress Questionnaire    Feeling of Stress: To some extent  Social Connections: Moderately Integrated (10/12/2024)   Social Connection and Isolation Panel    Frequency of Communication with Friends and Family: More than three times a week    Frequency of Social Gatherings with Friends and Family: More than three times a week    Attends Religious Services: More than 4 times per year    Active Member of [employer] or Organizations: No    Attends Banker Meetings: Never    Marital Status: Married  Catering Manager Violence: Not At Risk (10/12/2024)   Humiliation, Afraid, Rape, and Kick questionnaire    Fear of Current or Ex-Partner: No    Emotionally Abused: No    Physically Abused: No    Sexually Abused: No    Family History  Problem Relation Age of Onset  Alzheimer's disease Mother        deceased 33   Lymphoma Father 99       deceased 47   Heart disease Father    Skin cancer Brother        currently 79   Thyroid disease Son    Hypertension Son    Breast cancer Maternal Aunt 35       deceased 30s   Breast cancer Maternal Aunt 35       deceased 30s   Breast cancer Paternal Aunt        dx 57s; deceased 52s   Colon cancer Neg Hx    Bladder Cancer Neg Hx    Uterine cancer Neg Hx    Renal cancer Neg Hx     No Known Allergies   Review of Systems   Review of Systems: Negative Unless Checked Constitutional: [] Weight loss  [] Fever  [] Chills Cardiac: [] Chest pain   []  Atrial Fibrillation  [] Palpitations   [] Shortness of breath when laying flat   [] Shortness of breath with exertion. [] Shortness of breath at rest Vascular:  [] Pain in legs with walking   [] Pain in legs with standing [] Pain in legs when laying flat   [] Claudication    [] Pain in feet when laying flat    [] History of DVT   [] Phlebitis   [x] Swelling in legs   [] Varicose veins   [] Non-healing ulcers Pulmonary:   [] Uses home  oxygen   [] Productive cough   [] Hemoptysis   [] Wheeze  [] COPD   [] Asthma Neurologic:  [] Dizziness   [] Seizures  [] Blackouts [] History of stroke   [] History of TIA  [] Aphasia   [] Temporary Blindness   [] Weakness or numbness in arm   [] Weakness or numbness in leg Musculoskeletal:   [] Joint swelling   [] Joint pain   [] Low back pain  [x]  History of Knee Replacement [] Arthritis [] back Surgeries  []  Spinal Stenosis    Hematologic:  [] Easy bruising  [] Easy bleeding   [] Hypercoagulable state   [] Anemic Gastrointestinal:  [] Diarrhea   [] Vomiting  [] Gastroesophageal reflux/heartburn   [] Difficulty swallowing. [] Abdominal pain Genitourinary:  [] Chronic kidney disease   [] Difficult urination  [] Anuric   [] Blood in urine [] Frequent urination  [] Burning with urination   [] Hematuria Skin:  [] Rashes   [] Ulcers [] Wounds Psychological:  [] History of anxiety   []  History of major depression  []  Memory Difficulties      OBJECTIVE:     BP 138/84 (BP Location: Left Arm, Patient Position: Sitting, Cuff Size: Normal)   Pulse 64   Resp 18   Ht 5' 2 (1.575 m)   Wt 197 lb 12.8 oz (89.7 kg)   BMI 36.18 kg/m   Physical Exam Vitals reviewed.  HENT:     Head: Normocephalic.  Cardiovascular:     Rate and Rhythm: Normal rate.  Pulmonary:     Effort: Pulmonary effort is normal.  Musculoskeletal:     Left lower leg: Edema present.  Skin:    General: Skin is warm and dry.  Neurological:     Mental Status: She is alert and oriented to person, place, and time.  Psychiatric:        Mood and Affect: Mood normal.        Behavior: Behavior normal.        Thought Content: Thought content normal.        Judgment: Judgment normal.    Physical Exam EXTREMITIES: Foot warm, palpable pulse in foot, ankles tight.   CMP  Component Value Date/Time   NA 137 10/01/2024 1012   NA 141 11/21/2023 1428   K 3.8 10/01/2024 1012   CL 101 10/01/2024 1012   CO2 26 10/01/2024 1012   GLUCOSE 100 (H) 10/01/2024 1012   BUN  16 10/09/2024 1237   BUN 20 11/21/2023 1428   CREATININE 0.96 10/09/2024 1237   CREATININE 0.79 04/09/2018 1048   CALCIUM 9.5 10/01/2024 1012   PROT 7.8 10/01/2024 1012   PROT 7.5 11/21/2023 1428   ALBUMIN 4.2 10/01/2024 1012   ALBUMIN 4.5 11/21/2023 1428   AST 22 10/01/2024 1012   ALT 15 10/01/2024 1012   ALKPHOS 69 10/01/2024 1012   BILITOT 0.7 10/01/2024 1012   BILITOT 0.4 11/21/2023 1428   EGFR 81 11/21/2023 1428   GFRNONAA >60 10/09/2024 1237   GFRNONAA 77 04/09/2018 1048    No results found.     ASSESSMENT AND PLAN:  1. History of pulmonary embolism (Primary) The patient has done well status post joint replacement surgery.  Therefore, I recommend that we remove the IVC filter.  Risk and benefits were reviewed all questions answered patient has agreed to proceed.  Patient will continue to elevate to minimize swelling.  Given the history of DVT and the possibility of post phlebitic changes such as swelling and discomfort the patient should wear graduated compression stockings.  The compression should be worn on a daily basis.  In addition, behavioral modification including elevation during the day and avoidance of prolonged dependency is helpful.    The patient will follow-up with me after the filter removal.  2. Primary osteoarthritis of left knee The patient required arterial repair during surgery.  She has a left lower extremity with good coloring.  Believe it still intact we will have her return for noninvasive studies when she returns.     Current Outpatient Medications on File Prior to Visit  Medication Sig Dispense Refill   acetaminophen  (TYLENOL ) 650 MG CR tablet Take 650 mg by mouth every 8 (eight) hours as needed for pain.     aspirin  81 MG chewable tablet Chew 1 tablet (81 mg total) by mouth 2 (two) times daily.     Biotin 89999 MCG TABS Take 1 tablet by mouth daily.     Calcium Carb-Cholecalciferol 600-20 MG-MCG CHEW Chew 1 tablet by mouth every other day.      Calcium-Magnesium -Vitamin D (CALCIUM 1200+D3 PO) Take 1 tablet by mouth every other day.     celecoxib  (CELEBREX ) 200 MG capsule Take 1 capsule (200 mg total) by mouth 2 (two) times daily. 60 capsule 1   chlorhexidine  (HIBICLENS ) 4 % external liquid Apply 15 mLs (1 Application total) topically as directed for 30 doses. Use as directed daily for 5 days every other week for 6 weeks. 946 mL 1   chlorpheniramine (CHLOR-TRIMETON) 4 MG tablet Take 4 mg by mouth in the morning.     Cholecalciferol (VITAMIN D3) 1000 units CAPS Take 1,000 Units by mouth daily.     Coenzyme Q10 200 MG TABS Take 200 mg by mouth daily.     Cyanocobalamin (VITAMIN B-12) 5000 MCG TBDP Take 5,000 mcg by mouth daily in the afternoon.     fluticasone  (FLONASE ) 50 MCG/ACT nasal spray Place 1 spray into both nostrils daily as needed for allergies or rhinitis.     INTRAROSA  6.5 MG INST PLACE 6.5 MG VAGINALLY AT BEDTIME. (Patient taking differently: Place 6.5 mg vaginally 2 (two) times a week.) 28 each 11   Misc Natural  Products (GLUCOSAMINE CHOND MSM FORMULA) TABS Take 1 tablet by mouth daily.     Multiple Vitamins-Minerals (VITAFUSION MULTI WOMENS) CHEW Chew 1 tablet by mouth daily.     mupirocin  ointment (BACTROBAN ) 2 % Place 1 Application into the nose 2 (two) times daily for 60 doses. Use as directed 2 times daily for 5 days every other week for 6 weeks. 66 g 0   oxyCODONE  (OXY IR/ROXICODONE ) 5 MG immediate release tablet Take 1 tablet (5 mg total) by mouth every 4 (four) hours as needed for moderate pain (pain score 4-6) (pain score 4-6). 30 tablet 0   Polyethyl Glycol-Propyl Glycol (SYSTANE OP) Place 1 drop into both eyes daily as needed (dry eyes).     Propylene Glycol (SYSTANE BALANCE) 0.6 % SOLN Apply to eye.     traMADol  (ULTRAM ) 50 MG tablet Take 1-2 tablets (50-100 mg total) by mouth every 4 (four) hours as needed for moderate pain (pain score 4-6). 30 tablet 0   Turmeric 500 MG TABS Take 1,000 mg by mouth daily.      No current facility-administered medications on file prior to visit.    There are no Patient Instructions on file for this visit. No follow-ups on file.   Felcia Huebert E Jenilyn Magana, NP  This note was completed with Office Manager.  Any errors are purely unintentional.

## 2024-11-18 DIAGNOSIS — Z96652 Presence of left artificial knee joint: Secondary | ICD-10-CM | POA: Diagnosis not present

## 2024-11-18 DIAGNOSIS — R2689 Other abnormalities of gait and mobility: Secondary | ICD-10-CM | POA: Diagnosis not present

## 2024-11-18 DIAGNOSIS — M25562 Pain in left knee: Secondary | ICD-10-CM | POA: Diagnosis not present

## 2024-11-18 DIAGNOSIS — M25662 Stiffness of left knee, not elsewhere classified: Secondary | ICD-10-CM | POA: Diagnosis not present

## 2024-11-19 ENCOUNTER — Other Ambulatory Visit (HOSPITAL_COMMUNITY): Payer: Self-pay

## 2024-11-20 DIAGNOSIS — Z96652 Presence of left artificial knee joint: Secondary | ICD-10-CM | POA: Diagnosis not present

## 2024-11-20 DIAGNOSIS — R2689 Other abnormalities of gait and mobility: Secondary | ICD-10-CM | POA: Diagnosis not present

## 2024-11-20 DIAGNOSIS — M25662 Stiffness of left knee, not elsewhere classified: Secondary | ICD-10-CM | POA: Diagnosis not present

## 2024-11-20 DIAGNOSIS — M25562 Pain in left knee: Secondary | ICD-10-CM | POA: Diagnosis not present

## 2024-11-25 DIAGNOSIS — Z96652 Presence of left artificial knee joint: Secondary | ICD-10-CM | POA: Diagnosis not present

## 2024-11-25 DIAGNOSIS — R2689 Other abnormalities of gait and mobility: Secondary | ICD-10-CM | POA: Diagnosis not present

## 2024-11-25 DIAGNOSIS — M25662 Stiffness of left knee, not elsewhere classified: Secondary | ICD-10-CM | POA: Diagnosis not present

## 2024-11-25 DIAGNOSIS — M25562 Pain in left knee: Secondary | ICD-10-CM | POA: Diagnosis not present

## 2024-11-26 DIAGNOSIS — Z96652 Presence of left artificial knee joint: Secondary | ICD-10-CM | POA: Diagnosis not present

## 2024-12-03 DIAGNOSIS — R2689 Other abnormalities of gait and mobility: Secondary | ICD-10-CM | POA: Diagnosis not present

## 2024-12-03 DIAGNOSIS — M25562 Pain in left knee: Secondary | ICD-10-CM | POA: Diagnosis not present

## 2024-12-03 DIAGNOSIS — M25662 Stiffness of left knee, not elsewhere classified: Secondary | ICD-10-CM | POA: Diagnosis not present

## 2024-12-03 DIAGNOSIS — Z96652 Presence of left artificial knee joint: Secondary | ICD-10-CM | POA: Diagnosis not present

## 2024-12-09 ENCOUNTER — Telehealth (INDEPENDENT_AMBULATORY_CARE_PROVIDER_SITE_OTHER): Payer: Self-pay

## 2024-12-09 NOTE — Telephone Encounter (Signed)
 I attempted to contact the patient to schedule a IVC filter removal. I attempted to contact through her mobile and home phone and was unable to leave a message at either.

## 2025-01-07 ENCOUNTER — Encounter: Payer: Self-pay | Admitting: Obstetrics

## 2025-01-07 ENCOUNTER — Ambulatory Visit: Admitting: Obstetrics

## 2025-01-07 ENCOUNTER — Ambulatory Visit: Admitting: Obstetrics and Gynecology

## 2025-01-07 VITALS — BP 193/78 | HR 66

## 2025-01-07 DIAGNOSIS — N393 Stress incontinence (female) (male): Secondary | ICD-10-CM

## 2025-01-07 DIAGNOSIS — N765 Ulceration of vagina: Secondary | ICD-10-CM | POA: Insufficient documentation

## 2025-01-07 DIAGNOSIS — R198 Other specified symptoms and signs involving the digestive system and abdomen: Secondary | ICD-10-CM

## 2025-01-07 DIAGNOSIS — N952 Postmenopausal atrophic vaginitis: Secondary | ICD-10-CM | POA: Diagnosis not present

## 2025-01-07 DIAGNOSIS — N8110 Cystocele, unspecified: Secondary | ICD-10-CM | POA: Diagnosis not present

## 2025-01-07 DIAGNOSIS — N950 Postmenopausal bleeding: Secondary | ICD-10-CM

## 2025-01-07 MED ORDER — ESTRADIOL 0.01 % VA CREA: TOPICAL_CREAM | VAGINAL | 3 refills | Status: AC

## 2025-01-07 NOTE — Progress Notes (Signed)
 [facility] Return Visit  SUBJECTIVE  History of Present Illness: Stacy Warren is a 74 y.o. female seen in follow-up for stage II pelvic organ prolapse, pessary fitting, obesity, straining during bowel movements, postmenopausal bleeding, history of abnormal pap smear, stress urinary incontinence, and vaginal atrophy. Plan at last visit was trial of #7 long stem gellhorn pessary.   Admission for DVT 10/09/24 and underwent IVC filter placement on 10/11/24 with Left total knee arthroplasty using computer-assisted navigation by Dr. Mardee and Repair of transected left popliteal artery with interposition 6 mm Artegraft bypass from the proximal popliteal artery to the distal popliteal artery by Dr. Marea  History of PE and ER+ breast cancer  Failed #7 Shaatz pessary due to discomfort and increased urinary leakage Trial of #7 long stem gellhorn in 10/08/24, reports need to push stem up 1x/day to help empty her bladder. Reduce urinary leakage managed by 1 thin pad/day. Reports intermittent spotting. Denies vaginal odor or discharge  Failed size 3 incontinence ring with support, 5 and 6 ring with support pessary, size 2 donut, 3 donut, 3 short stem gellhorn since 12/25/19 with increased SUI  Caregiver for husband with dementia and DM  Uses intrarosa  ($60) Reports worsening constipation since 09/2024  TVUS 09/19/23 [facility] ULTRASOUND  LMP: remote  INDICATION: PMB  UTERINE MEASUREMENTS:  6.9 x 4.5 x 3.4 cm  ANOMALIES OF THE CERVIX: None seen  ENDOMETRIAL STRIPE: 3.5 mm  UTERINE ABNORMALITIES: bone  LEFT  OVARY:  1.9 x 2.0 x 0.8 cm  RIGHT OVARY: 2.0 x 1.8 x 0.9 cm  OVARIAN VASCULARITY: grossly normal  FREE FLUID:  none  MASSES OR CYSTS IN PELVIS:  none  PROBES USED:   ABDOMINAL/ VAGINAL -both Exam End: 09/19/23 15:42    Past Medical History: Patient  has a past medical history of Aromatase inhibitor use (06/11/2020), Arthritis, Breast cancer (HCC), Cancer  (HCC), Glaucoma, Motion sickness, Osteopenia (11/28/2018), and Pulmonary embolism (HCC) (04/09/2018).   Past Surgical History: She  has a past surgical history that includes Hip pinning (2007); Leg Surgery (2007); Tonsillectomy; bunionectomy (Left); Vaginal delivery; Cervical cone biopsy (8015); Hysteroscopy with D & C (N/A, 06/07/2018); Dilation and curettage of uterus (06/07/2018); Breast lumpectomy with needle localization and axillary sentinel lymph node bx (Right, 12/31/2018); Breast biopsy (Right, 12/13/2018); Breast lumpectomy (Right, 12/31/2018); Cataract extraction w/PHACO (Left, 10/12/2022); Cataract extraction w/PHACO (Right, 10/26/2022); Colonoscopy with propofol  (N/A, 01/23/2024); biopsy (01/23/2024); Eye surgery; IVC FILTER INSERTION (N/A, 10/09/2024); and Knee Arthroplasty (Left, 10/11/2024).   Medications: She has a current medication list which includes the following prescription(s): acetaminophen , aspirin , biotin, calcium carb-cholecalciferol, calcium-magnesium -vitamin d, celecoxib , chlorhexidine , chlorpheniramine, vitamin d3, coenzyme q10, vitamin b-12, [START ON 01/09/2025] estradiol , fluticasone , intrarosa , glucosamine chond msm formula, vitafusion multi womens, oxycodone , polyethyl glycol-propyl glycol, systane balance, tramadol , and turmeric.   Allergies: Patient has no known allergies.   Social History: Patient  reports that she has never smoked. She has never used smokeless tobacco. She reports that she does not drink alcohol and does not use drugs.     OBJECTIVE     Physical Exam: Vitals:   01/07/25 1328 01/07/25 1358  BP: (!) 148/74 (!) 193/78  Pulse: 73 66   Physical Exam Constitutional:      General: She is not in acute distress.    Appearance: Normal appearance.  Genitourinary:     Bladder and urethral meatus normal.     No lesions in the vagina.     Genitourinary Comments: #7 long stem gellhorn  pessary removed with ringed forceps     Right Labia: No rash,  tenderness, lesions, skin changes or Bartholin's cyst.    Left Labia: No tenderness, lesions, skin changes, Bartholin's cyst or rash.    Vaginal bleeding and ulceration present.     No vaginal discharge, erythema, tenderness or granulation tissue.     Severe vaginal atrophy present.    No cervical motion tenderness, discharge, friability, lesion, polyp or nabothian cyst.        Uterus is prolapsed.     Urethral meatus caruncle not present.    No urethral prolapse, tenderness, mass, hypermobility or discharge present.     Bladder is not tender, urgency on palpation not present and masses not present.   Cardiovascular:     Rate and Rhythm: Normal rate.  Pulmonary:     Effort: Pulmonary effort is normal. No respiratory distress.  Abdominal:     General: There is no distension.  Neurological:     Mental Status: She is alert.  Vitals reviewed. Exam conducted with a chaperone present.       ASSESSMENT AND PLAN    Stacy Warren is a 74 y.o. with:  1. Vaginal atrophy   2. Vaginal ulcer   3. Postmenopausal bleeding   4. Urinary, incontinence, stress female   5. Straining during bowel movements   6. 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 - however intrarosa  ($60) 2x/week - pt reports burning during vaginal exam with lubrication and lidocaine  use - consider Vit E suppositories - Rx low dose vaginal estrogen due to vaginal ulcer and cost  Orders: -     Estradiol ; Place 1g nightly three times a week  Dispense: 42.5 g; Refill: 3  Vaginal ulcer Assessment & Plan: - pessary removed due to active bleeding from ulcer, treated with silver nitrate - change from intrarosa  to low dose vaginal estrogen - consider pap smear due to h/o CKC   Orders: -     Estradiol ; Place 1g nightly three times a week   Dispense: 42.5 g; Refill: 3  Postmenopausal bleeding Assessment & Plan: - TVUS 09/19/23 with 6.9 x 4.5 x 3.4 cm uterus and 3.70mm endometrial stripe - reports spotting since #7 long stem gellhorn pessary use with ulceration noted on exam - repeat TVUS if persistent bleeding and possible EMB. 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 - prior referral to GYN to establish care  Orders: -     US  PELVIS TRANSVAGINAL NON-OB (TV ONLY); Future  Urinary, incontinence, stress female Assessment & Plan: - 07/12/24 POCT UA + leuk, PVR 13mL - worsened after pessary use, discussed occult SUI - For treatment of stress urinary incontinence,  non-surgical options include expectant management, weight loss, physical therapy, as well as a pessary.  Surgical options include a midurethral sling, Burch urethropexy, and transurethral injection of a bulking agent. - monitor symptoms with pessary removal, minimal symptoms with #7 long stem gellhorn pessary use - encouraged splinting to ensure bladder emptying until pessary is replaced   Straining during bowel movements Assessment & Plan: - uses magnesium  liquid OTC 2x/week for the past few years when she experiences need to strain - we reviewed the importance of a better bowel regimen.  We also discussed the importance of avoiding chronic straining, as it can  exacerbate her pelvic floor symptoms; we discussed treating constipation and straining prior to surgery, as postoperative straining can lead to damage to the repair and recurrence of symptoms. We discussed initiating therapy with increasing fluid intake, fiber supplementation, stool softeners, and laxatives such as miralax.  - encouraged titration of miralax to replace magnesium  use and start squatting position for defecation to allow adequate pelvic floor relaxation  - consider pessary refitting with cube pessary to r/o outlet obstruction from pessary   Pelvic organ prolapse quantification  stage 2 cystocele Assessment & Plan: - desires to continue long stem gellhorn pessary use due to longest duration of symptomatic relief, however continue to require daily adjustment with long stem - For treatment of pelvic organ prolapse, we discussed options for management including expectant management, conservative management, and surgical management, such as Kegels, a pessary, pelvic floor physical therapy, and specific surgical procedures. 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 Replacement of #7 long stem gellhorn pessary   Time spent: I spent 25 minutes dedicated to the care of this patient on the date of this encounter to include pre-visit review of records, face-to-face time with the patient discussing vaginal ulcer, vaginal atrophy, stage II pelvic organ prolapse, straining during bowel movements, stress urinary incontinence,  and post visit documentation and ordering medication/ testing.   Lianne ONEIDA Gillis, MD

## 2025-01-07 NOTE — Assessment & Plan Note (Signed)
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

## 2025-01-07 NOTE — Assessment & Plan Note (Signed)
-   pessary removed due to active bleeding from ulcer, treated with silver nitrate - change from intrarosa  to low dose vaginal estrogen - consider pap smear due to h/o CKC

## 2025-01-07 NOTE — Assessment & Plan Note (Addendum)
-   desires to continue long stem gellhorn pessary use due to longest duration of symptomatic relief, however continue to require daily adjustment with long stem - For treatment of pelvic organ prolapse, we discussed options for management including expectant management, conservative management, and surgical management, such as Kegels, a pessary, pelvic floor physical therapy, and specific surgical procedures. 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 Replacement of #7 long stem gellhorn pessary

## 2025-01-07 NOTE — Assessment & Plan Note (Signed)
-   uses magnesium  liquid OTC 2x/week for the past few years when she experiences need to strain - we reviewed the importance of a better bowel regimen.  We also discussed the importance of avoiding chronic straining, as it can exacerbate her pelvic floor symptoms; we discussed treating constipation and straining prior to surgery, as postoperative straining can lead to damage to the repair and recurrence of symptoms. We discussed initiating therapy with increasing fluid intake, fiber supplementation, stool softeners, and laxatives such as miralax.  - encouraged titration of miralax to replace magnesium  use and start squatting position for defecation to allow adequate pelvic floor relaxation  - consider pessary refitting with cube pessary to r/o outlet obstruction from pessary

## 2025-01-07 NOTE — Patient Instructions (Addendum)
 For vaginal atrophy (thinning of the vaginal tissue that can cause dryness and burning) and UTI prevention we discussed estrogen replacement in the form of vaginal cream.   Start vaginal estrogen therapy at night 2-3 times weekly at night. This can be placed with your finger or an applicator inside the vagina and around the urethra.  Please let us  know if the prescription is too expensive and we can look for alternative options.   Is vaginal estrogen therapy safe for me? Vaginal estrogen preparations act on the vaginal skin, and only a very tiny amount is absorbed into the bloodstream (0.01%).  They work in a similar way to hand or face cream.  There is minimal absorption and they are therefore perfectly safe. If you have had breast cancer and have persistent troublesome symptoms which aren't settling with vaginal moisturisers and lubricants, local estrogen treatment may be a possibility, but consultation with your oncologist should take place first.   Resume splinting (gently reducing your vaginal bulge) to ensure bladder or bowel emptying.   Please return if you experience any change in urinary or vaginal symptoms.   Please call radiology at [phone] to schedule your imaging study today

## 2025-01-07 NOTE — Assessment & Plan Note (Signed)
-   TVUS 09/19/23 with 6.9 x 4.5 x 3.4 cm uterus and 3.6mm endometrial stripe - reports spotting since #7 long stem gellhorn pessary use with ulceration noted on exam - repeat TVUS if persistent bleeding and possible EMB. 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 - prior referral to GYN to establish care

## 2025-01-07 NOTE — Assessment & Plan Note (Signed)
-   07/12/24 POCT UA + leuk, PVR 13mL - worsened after pessary use, discussed occult SUI - For treatment of stress urinary incontinence,  non-surgical options include expectant management, weight loss, physical therapy, as well as a pessary.  Surgical options include a midurethral sling, Burch urethropexy, and transurethral injection of a bulking agent. - monitor symptoms with pessary removal, minimal symptoms with #7 long stem gellhorn pessary use - encouraged splinting to ensure bladder emptying until pessary is replaced

## 2025-01-09 ENCOUNTER — Telehealth (INDEPENDENT_AMBULATORY_CARE_PROVIDER_SITE_OTHER): Payer: Self-pay

## 2025-01-09 NOTE — Telephone Encounter (Signed)
 Spoke with the patient and per her she is having her other knee surgery and will wait until sometime after that to have her filter removed.

## 2025-01-10 ENCOUNTER — Ambulatory Visit
Admission: RE | Admit: 2025-01-10 | Discharge: 2025-01-10 | Disposition: A | Discharge status: Home / Self Care | Source: Ambulatory Visit | Attending: Obstetrics | Admitting: Obstetrics

## 2025-01-10 DIAGNOSIS — N950 Postmenopausal bleeding: Secondary | ICD-10-CM | POA: Diagnosis present

## 2025-01-14 ENCOUNTER — Ambulatory Visit: Payer: Self-pay | Admitting: Obstetrics

## 2025-02-05 ENCOUNTER — Encounter: Admitting: Pediatrics

## 2025-02-28 ENCOUNTER — Ambulatory Visit: Admitting: Obstetrics

## 2025-04-24 ENCOUNTER — Encounter: Admitting: Nurse Practitioner

## 2025-06-17 ENCOUNTER — Ambulatory Visit
